# Patient Record
Sex: Female | Born: 1960 | Race: White | Hispanic: No | Marital: Married | Smoking: Never smoker
Health system: Southern US, Community
[De-identification: ages and names within clinical notes are randomized; demographics above are authoritative.]

## PROBLEM LIST (undated history)

## (undated) ENCOUNTER — Emergency Department (HOSPITAL_BASED_OUTPATIENT_CLINIC_OR_DEPARTMENT_OTHER): Admission: EM | Payer: BC Managed Care – PPO | Source: Home / Self Care

## (undated) DIAGNOSIS — F32A Depression, unspecified: Secondary | ICD-10-CM

## (undated) DIAGNOSIS — F419 Anxiety disorder, unspecified: Secondary | ICD-10-CM

## (undated) DIAGNOSIS — F319 Bipolar disorder, unspecified: Secondary | ICD-10-CM

## (undated) DIAGNOSIS — E039 Hypothyroidism, unspecified: Secondary | ICD-10-CM

## (undated) DIAGNOSIS — F329 Major depressive disorder, single episode, unspecified: Secondary | ICD-10-CM

## (undated) HISTORY — PX: FRACTURE SURGERY: SHX138

## (undated) HISTORY — PX: DIAGNOSTIC LAPAROSCOPY: SUR761

## (undated) HISTORY — PX: LAPAROSCOPIC GASTRIC SLEEVE RESECTION: SHX5895

## (undated) HISTORY — PX: OTHER SURGICAL HISTORY: SHX169

## (undated) HISTORY — PX: DILATION AND CURETTAGE OF UTERUS: SHX78

---

## 1999-05-18 ENCOUNTER — Encounter: Payer: Self-pay | Admitting: Family Medicine

## 1999-05-18 ENCOUNTER — Ambulatory Visit (HOSPITAL_COMMUNITY): Admission: RE | Admit: 1999-05-18 | Discharge: 1999-05-18 | Payer: Self-pay | Admitting: Family Medicine

## 2000-12-29 ENCOUNTER — Encounter: Payer: Self-pay | Admitting: Obstetrics and Gynecology

## 2000-12-29 ENCOUNTER — Ambulatory Visit (HOSPITAL_COMMUNITY): Admission: RE | Admit: 2000-12-29 | Discharge: 2000-12-29 | Payer: Self-pay | Admitting: Obstetrics and Gynecology

## 2001-01-11 ENCOUNTER — Other Ambulatory Visit: Admission: RE | Admit: 2001-01-11 | Discharge: 2001-01-11 | Payer: Self-pay | Admitting: Obstetrics and Gynecology

## 2002-01-17 ENCOUNTER — Other Ambulatory Visit: Admission: RE | Admit: 2002-01-17 | Discharge: 2002-01-17 | Payer: Self-pay | Admitting: Obstetrics and Gynecology

## 2002-01-26 ENCOUNTER — Encounter: Payer: Self-pay | Admitting: Obstetrics and Gynecology

## 2002-01-26 ENCOUNTER — Ambulatory Visit (HOSPITAL_COMMUNITY): Admission: RE | Admit: 2002-01-26 | Discharge: 2002-01-26 | Payer: Self-pay | Admitting: Obstetrics and Gynecology

## 2002-05-02 ENCOUNTER — Ambulatory Visit (HOSPITAL_COMMUNITY): Admission: RE | Admit: 2002-05-02 | Discharge: 2002-05-02 | Payer: Self-pay | Admitting: Obstetrics and Gynecology

## 2002-05-02 ENCOUNTER — Encounter (INDEPENDENT_AMBULATORY_CARE_PROVIDER_SITE_OTHER): Payer: Self-pay | Admitting: Specialist

## 2003-01-19 ENCOUNTER — Ambulatory Visit (HOSPITAL_BASED_OUTPATIENT_CLINIC_OR_DEPARTMENT_OTHER): Admission: RE | Admit: 2003-01-19 | Discharge: 2003-01-19 | Payer: Self-pay | Admitting: Orthopedic Surgery

## 2003-01-19 ENCOUNTER — Encounter (INDEPENDENT_AMBULATORY_CARE_PROVIDER_SITE_OTHER): Payer: Self-pay | Admitting: *Deleted

## 2003-03-22 ENCOUNTER — Other Ambulatory Visit: Admission: RE | Admit: 2003-03-22 | Discharge: 2003-03-22 | Payer: Self-pay | Admitting: Obstetrics and Gynecology

## 2003-03-31 ENCOUNTER — Encounter: Payer: Self-pay | Admitting: Obstetrics and Gynecology

## 2003-03-31 ENCOUNTER — Ambulatory Visit (HOSPITAL_COMMUNITY): Admission: RE | Admit: 2003-03-31 | Discharge: 2003-03-31 | Payer: Self-pay | Admitting: Obstetrics and Gynecology

## 2004-04-08 ENCOUNTER — Ambulatory Visit (HOSPITAL_COMMUNITY): Admission: RE | Admit: 2004-04-08 | Discharge: 2004-04-08 | Payer: Self-pay | Admitting: Obstetrics and Gynecology

## 2004-05-01 ENCOUNTER — Other Ambulatory Visit: Admission: RE | Admit: 2004-05-01 | Discharge: 2004-05-01 | Payer: Self-pay | Admitting: Obstetrics and Gynecology

## 2005-05-02 ENCOUNTER — Other Ambulatory Visit: Admission: RE | Admit: 2005-05-02 | Discharge: 2005-05-02 | Payer: Self-pay | Admitting: Obstetrics and Gynecology

## 2005-07-17 ENCOUNTER — Ambulatory Visit (HOSPITAL_COMMUNITY): Admission: RE | Admit: 2005-07-17 | Discharge: 2005-07-17 | Payer: Self-pay | Admitting: Obstetrics and Gynecology

## 2006-05-25 ENCOUNTER — Other Ambulatory Visit: Admission: RE | Admit: 2006-05-25 | Discharge: 2006-05-25 | Payer: Self-pay | Admitting: Obstetrics and Gynecology

## 2006-07-20 ENCOUNTER — Ambulatory Visit (HOSPITAL_COMMUNITY): Admission: RE | Admit: 2006-07-20 | Discharge: 2006-07-20 | Payer: Self-pay | Admitting: Obstetrics and Gynecology

## 2007-01-13 ENCOUNTER — Encounter: Admission: RE | Admit: 2007-01-13 | Discharge: 2007-01-13 | Payer: Self-pay | Admitting: Family Medicine

## 2007-08-13 ENCOUNTER — Ambulatory Visit (HOSPITAL_COMMUNITY): Admission: RE | Admit: 2007-08-13 | Discharge: 2007-08-13 | Payer: Self-pay | Admitting: Obstetrics and Gynecology

## 2008-09-14 ENCOUNTER — Ambulatory Visit (HOSPITAL_COMMUNITY): Admission: RE | Admit: 2008-09-14 | Discharge: 2008-09-14 | Payer: Self-pay | Admitting: Obstetrics and Gynecology

## 2008-09-28 ENCOUNTER — Encounter: Admission: RE | Admit: 2008-09-28 | Discharge: 2008-09-28 | Payer: Self-pay | Admitting: Internal Medicine

## 2008-11-06 ENCOUNTER — Encounter: Admission: RE | Admit: 2008-11-06 | Discharge: 2008-11-06 | Payer: Self-pay | Admitting: Podiatry

## 2010-01-21 ENCOUNTER — Ambulatory Visit (HOSPITAL_COMMUNITY): Admission: RE | Admit: 2010-01-21 | Discharge: 2010-01-21 | Payer: Self-pay | Admitting: Obstetrics and Gynecology

## 2010-07-22 ENCOUNTER — Encounter: Payer: Self-pay | Admitting: Podiatry

## 2010-11-15 NOTE — Op Note (Signed)
NAME:  Lindsay [name], YAGI A                           ACCOUNT NO.:  [id number]   MEDICAL RECORD NO.:  [id number]                   PATIENT TYPE:  AMB   LOCATION:  DSC                                  FACILITY:  [facility]   PHYSICIAN:  Katy Fitch. Naaman Plummer., M.D.          DATE OF BIRTH:  October 22, 1960   DATE OF PROCEDURE:  01/19/2003  DATE OF DISCHARGE:                                 OPERATIVE REPORT   PREOPERATIVE DIAGNOSIS:  Hair granuloma/foreign body granuloma left long  finger volar aspect of PIP joint.   POSTOPERATIVE DIAGNOSIS:  Hair granuloma/foreign body granuloma left long  finger volar aspect of PIP joint.   OPERATION:  Excision of foreign body granuloma and hair retained foreign  body from left long finger volar aspect.   SURGEON:  Katy Fitch. Sypher, M.D.   ASSISTANT:  Jonni Sanger, P.A.   ANESTHESIA:  MAC supplemented by IV sedation.   SUPERVISING ANESTHESIOLOGIST:  Kaylyn Layer. Michelle Piper, M.D.   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 She developed a chronic  wound that would not heal correctly. She had a mass consistent with a  granuloma/foreign body granuloma. She was referred by her primary care  physician, Meredith Staggers, M.D., for evaluation and management of this  predicament.   After informed consent she was brought to the operating room at this time  anticipating debridement of her wound.   DESCRIPTION OF PROCEDURE:  Zurii Hewes was brought to the operating room and  placed in the supine position on the operating table. Following light  sedation the left arm was prepped with Betadine and soap solution and  sterilely draped. Then 0.25% Marcaine and 2% Lidocaine were infiltrated at  the metacarpal head level to obtain a digital block. When anesthesia was  satisfactory the finger was exsanguinated with a gauze wrap and a 1/2-inch  Penrose drain placed over the proximal phalangeal  segment as a digital  tourniquet.   The procedure commenced with an elliptical excision of skin involving the  entire obvious granuloma. The subcutaneous tissues were carefully divided  taking care to identify the subcutaneous tissues deep to the hypertrophic  scar. Multiple fine, black hairs were identified deep to the dermis in the  subcutaneous tissues surrounding the neurovascular bundle. These were picked  individually with plain forceps and a Rongeur.   The subcutaneous granuloma was excised with a Rongeur followed by careful  irrigation of the wound. The wound was then repaired with vertical mattress  suture of 5-0 nylon. A compressive dressing was applied with Xeroform,  sterile gauze, and Coban.   For after care Ms. Pokorny was given a prescription for Darvocet-N 100 1-2  tablets p.o. q.4-6h. p.r.n. pain, 20 tablets without refill. Also she was  given a prescription for Keflex 500 mg 1 p.o. q.8h. x4 day as a prophylactic  antibiotic.                                               Katy Fitch Naaman Plummer., M.D.    RVS/MEDQ  D:  01/19/2003  T:  01/19/2003  Job:  [id number]

## 2010-11-15 NOTE — H&P (Signed)
NAME:  Lindsay Lindsay [name], Lindsay Lindsay [name]                           ACCOUNT NO.:  [id number]   MEDICAL RECORD NO.:  [id number]                   PATIENT TYPE:  AMB   LOCATION:  SDC                                  FACILITY:  [facility]   PHYSICIAN:  Naima Lindsay [name]. Dillard, M.D.              DATE OF BIRTH:  09-27-1960   DATE OF ADMISSION:  DATE OF DISCHARGE:                                HISTORY & PHYSICAL   CHIEF COMPLAINT:  Metrorrhagia with stress urinary incontinence with primary  cystocele.   HISTORY OF PRESENT ILLNESS:  The patient presented to the office on January 27, 2002 complaining of leakage of urine with coughing or sneezing.  She denied  having any urinary tract infection symptoms.  States that it has been going  on for several years.  Her urine culture was found to be negative.  She was  also found to have some mild hematuria.  The patient did not have any  abdominal pain.  She also stated that she was having some spotting before  menses.  Denied any abdominal pain, change in bowel or bladder habits.  The  patient denied having any contraception use, fibroids, or hormone therapy  use.  She denied having any menopausal symptoms or vaginal discharge or  increased stress.   PAST OB HISTORY:  Significant for Lindsay [name] full-term vaginal delivery x1 without  any complications and two cesarean sections without any complications.  Her  large infant was 8 pounds 16 ounces.   PAST SURGICAL HISTORY:  Cesarean section x2 and Lindsay [name] rectocele repair in [facility].   ALLERGIES:  No known drug allergies.   MEDICATIONS:  Wellbutrin, Paxil, calcium, multivitamins.   FAMILY HISTORY:  Unremarkable for any ovarian or breast cancer.  She does  have an aunt who has diabetes and denies any hypertensive disease in the  family.   SOCIAL HISTORY:  Negative x3.   PERTINENT LABORATORIES:  The patient had an endometrial biopsy which showed  secretory endometrium.  She had Lindsay [name] sonohysterogram which showed some  echogenic foci in the  endometrial canal with appearance of polyps.  The  largest was 1.3 cm.  The next largest was 9.0 mm.  The next one was 5.5 mm.  Her endometrial biopsy showed benign secretory endometrium.  Her mammogram  from July 2003 was found to be negative.  Her Pap smear on January 17, 2002 was  within normal limits.   REVIEW OF SYSTEMS:  HEENT:  Within normal limits.  ENDOCRINE:  Within normal  limits.  PSYCHIATRIC:  She does have Lindsay [name] history of depression.  CARDIORESPIRATORY:  Within normal limits.  GENITOURINARY:  As above.  MUSCULOSKELETAL:  Within normal limits.  NEUROLOGIC:  Within normal limits.   PHYSICAL EXAMINATION:  VITAL SIGNS:  The patient weighs 186 pounds.  Her  blood pressure is 110/60.  HEENT:  Pupils are equal.  Hearing is normal.  Throat is  clear.  NECK:  Thyroid was not enlarged.  HEART:  Regular rate and rhythm.  LUNGS:  Clear to auscultation bilaterally.  BREASTS:  No masses, discharge, skin changes, or nipple retraction  bilaterally.  BACK:  No CVA tenderness.  ABDOMEN:  Nontender without any masses or organomegaly.  EXTREMITIES:  No clubbing, cyanosis, edema.  NEUROLOGIC:  Within normal limits.  PELVIC:  Vulva/vaginal examination also within normal limits.  She had Lindsay [name]  first degree cystocele.  Cervix was nontender without lesions.  Uterus was  about 10 weeks' size and nontender.  Adnexa had no masses.   ASSESSMENT:  1. Metrorrhagia with endometrial polyps.  2. Primary cystocele.   PLAN:  Do Lindsay [name] D&C/hysteroscopy, polypectomy and Lindsay [name] sling SPARK procedure with  Dr. Brunilda Payor.  The patient has been evaluated by Dr. Brunilda Payor already.  She  understands that the risks are, but not limited to, further  D&C/hysteroscopy, perforation of the uterus, damage to internal organs such  as bowel, bladder, or ureters, infection, and bleeding.  She went over the  risks of the sling procedure with Dr. Brunilda Payor.                                               Naima Lindsay [name]. Normand Sloop, M.D.    NAD/MEDQ  D:   05/02/2002  T:  05/02/2002  Job:  [id number]

## 2011-01-06 ENCOUNTER — Other Ambulatory Visit (HOSPITAL_COMMUNITY): Payer: Self-pay | Admitting: Obstetrics and Gynecology

## 2011-01-06 DIAGNOSIS — Z1231 Encounter for screening mammogram for malignant neoplasm of breast: Secondary | ICD-10-CM

## 2011-01-23 ENCOUNTER — Ambulatory Visit (HOSPITAL_COMMUNITY)
Admission: RE | Admit: 2011-01-23 | Discharge: 2011-01-23 | Disposition: A | Discharge status: Home / Self Care | Payer: BC Managed Care – PPO | Source: Ambulatory Visit | Attending: Obstetrics and Gynecology | Admitting: Obstetrics and Gynecology

## 2011-01-23 DIAGNOSIS — Z1231 Encounter for screening mammogram for malignant neoplasm of breast: Secondary | ICD-10-CM | POA: Insufficient documentation

## 2012-03-16 ENCOUNTER — Other Ambulatory Visit (HOSPITAL_COMMUNITY): Payer: Self-pay | Admitting: Obstetrics and Gynecology

## 2012-03-16 DIAGNOSIS — Z1231 Encounter for screening mammogram for malignant neoplasm of breast: Secondary | ICD-10-CM

## 2012-03-30 ENCOUNTER — Ambulatory Visit (HOSPITAL_COMMUNITY)
Admission: RE | Admit: 2012-03-30 | Discharge: 2012-03-30 | Disposition: A | Discharge status: Home / Self Care | Payer: BC Managed Care – PPO | Source: Ambulatory Visit | Attending: Obstetrics and Gynecology | Admitting: Obstetrics and Gynecology

## 2012-03-30 DIAGNOSIS — Z1231 Encounter for screening mammogram for malignant neoplasm of breast: Secondary | ICD-10-CM | POA: Insufficient documentation

## 2013-03-24 ENCOUNTER — Other Ambulatory Visit (HOSPITAL_COMMUNITY): Payer: Self-pay | Admitting: Nurse Practitioner

## 2013-03-24 DIAGNOSIS — Z1231 Encounter for screening mammogram for malignant neoplasm of breast: Secondary | ICD-10-CM

## 2013-04-05 ENCOUNTER — Ambulatory Visit (HOSPITAL_COMMUNITY)
Admission: RE | Admit: 2013-04-05 | Discharge: 2013-04-05 | Disposition: A | Discharge status: Home / Self Care | Payer: BC Managed Care – PPO | Source: Ambulatory Visit | Attending: Nurse Practitioner | Admitting: Nurse Practitioner

## 2013-04-05 DIAGNOSIS — Z1231 Encounter for screening mammogram for malignant neoplasm of breast: Secondary | ICD-10-CM

## 2014-07-03 ENCOUNTER — Other Ambulatory Visit (HOSPITAL_COMMUNITY): Payer: Self-pay | Admitting: Nurse Practitioner

## 2014-07-03 DIAGNOSIS — Z1231 Encounter for screening mammogram for malignant neoplasm of breast: Secondary | ICD-10-CM

## 2014-07-04 ENCOUNTER — Ambulatory Visit (HOSPITAL_COMMUNITY): Payer: Self-pay

## 2015-01-22 ENCOUNTER — Ambulatory Visit (HOSPITAL_COMMUNITY)
Admission: RE | Admit: 2015-01-22 | Discharge: 2015-01-22 | Disposition: A | Discharge status: Home / Self Care | Payer: BLUE CROSS/BLUE SHIELD | Source: Ambulatory Visit | Attending: Nurse Practitioner | Admitting: Nurse Practitioner

## 2015-01-22 DIAGNOSIS — Z1231 Encounter for screening mammogram for malignant neoplasm of breast: Secondary | ICD-10-CM | POA: Insufficient documentation

## 2015-05-09 ENCOUNTER — Other Ambulatory Visit: Payer: Self-pay | Admitting: Obstetrics and Gynecology

## 2015-05-11 ENCOUNTER — Other Ambulatory Visit: Payer: Self-pay | Admitting: Family Medicine

## 2015-05-11 DIAGNOSIS — M79605 Pain in left leg: Secondary | ICD-10-CM

## 2015-05-11 DIAGNOSIS — Z9889 Other specified postprocedural states: Secondary | ICD-10-CM

## 2015-05-22 ENCOUNTER — Other Ambulatory Visit: Payer: Self-pay | Admitting: Obstetrics and Gynecology

## 2015-05-23 ENCOUNTER — Other Ambulatory Visit (HOSPITAL_COMMUNITY): Payer: Self-pay | Admitting: Obstetrics and Gynecology

## 2015-05-23 NOTE — H&P (Signed)
Lindsay [name] is a 54 y.o. female,  P: 3-0-0-3 presents for placement of tension free vaginal tape and hysteroscopy, dilatation and curettage because of stress urinary incontinence and post menopausal bleeding.  The patient went for 2 years without a menstrual period until 2 years ago when she began to have intermittent vaginal bleeding that is only noticeable with wiping.  She denies any cramping associated with these episodes and has not had an actual flow during this time. She goes on to deny any changes in bowel function, discomfort with intercourse or vaginitis symptoms.  Over the past year, however,  the patient reports leaking of urine with various activities, especially with brisk walking. She has no history of renal stones, dysuria, urinary urgency or frequency. The patient underwent Cystometrics in November 2016  that revealed stress urinary incontinence secondary to hypermobility and increased bladder capacity but no urinary retention.  A pelvic ultrasound done at the same time showed: uterus: 6.83 x 4.53 x 4.60 cm, endometrium: 0.34 cm (hyperechoic, irregular with normal morphology), a normal right ovary but left ovary was not visualized;  both adnexae were normal. An endometrial biopsy performed at pre-operative visit showed: minute fragments of endometrial glands and stroma and negative for hyperplasia or malingnancy.  A review of both medical and surgical management options were given to the patient regarding her bleeding and incontinence symptoms, however,  she has decided to proceed with placement of tension free vaginal tape and hysteroscopy, dilatation and curettage for management and further evaluation of her symptoms.   Past Medical History  OB History: G: 3;  P: 3-0-0-3;  C-section 1980 and 1983; SVB 1985 (8 lbs. 13 oz. infant)  GYN History: menarche: 89 YO    LMP: post menopausal (see HPI)    Contracepton vasectomy  The patient denies history of sexually transmitted disease.  Remote   history of abnormal PAP smear that was repeated and returned normal.   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 Tetracyclines & Related Other (See Comments)    Unknown childhood reaction    ROS: Admits to glasses for reading, occasional constipation and lower back pain but  denies headache, vision changes, nasal congestion, dysphagia, tinnitus, dizziness, hoarseness, cough,  chest pain, shortness of breath, nausea, vomiting, diarrhea,  urinary frequency, urgency  dysuria, hematuria, vaginitis symptoms, pelvic pain, swelling of joints,easy bruising,  myalgias, arthralgias, skin rashes, unexplained weight loss and except as is mentioned in the history of present illness, patient's review of systems is otherwise negative.   Physical Exam  Bp: 124/74  P: 64   R: 20  Temperature: 98.5 degrees F orally  Weight: 189 lbs.  Height: 5'7"  BMI: 29.1  Neck:  supple without masses or thyromegaly Lungs: clear to auscultation Heart: regular rate and rhythm Abdomen: soft, non-tender and no organomegaly Pelvic:EGBUS- wnl; vagina-normal rugae; uterus-normal size, cervix:  stenotic,  without lesions or motion tenderness; adnexae-no tenderness or masses Extremities:  no clubbing, cyanosis or edema   Assesment: Post Menopausal Bleeding           Stress Urinary Incontinence   Disposition: Reviewed the risks of surgery to include, but not limited to: reaction to anesthesia, damage to adjacent organs, infection, worsening of incontinence symptoms, excessive bleeding and erosion of vaginal tape. The patient verbalized understanding of these risks and has consented to proceed with Hysteroscopy, Dilatation, Curettage and Placement of Tension Free Vaginal Tape on 06/01/2015.  The patient was given Cytotec to use as directed prior to her procedure in order to facilitate dilatation of her cervix at the time of surgery.  CSN# [id number]   Vernon Ariel J. Lowell GuitarPowell, PA-C  for Dr. Woodroe ModeAngela Y. Su Hiltoberts

## 2015-05-25 ENCOUNTER — Other Ambulatory Visit (HOSPITAL_COMMUNITY): Payer: Self-pay | Admitting: Obstetrics and Gynecology

## 2015-05-28 ENCOUNTER — Encounter (HOSPITAL_COMMUNITY)
Admission: RE | Admit: 2015-05-28 | Discharge: 2015-05-28 | Disposition: A | Discharge status: Home / Self Care | Payer: BLUE CROSS/BLUE SHIELD | Source: Ambulatory Visit | Attending: Obstetrics and Gynecology | Admitting: Obstetrics and Gynecology

## 2015-05-28 ENCOUNTER — Encounter (HOSPITAL_COMMUNITY): Payer: Self-pay

## 2015-05-28 DIAGNOSIS — Z01818 Encounter for other preprocedural examination: Secondary | ICD-10-CM | POA: Insufficient documentation

## 2015-05-28 DIAGNOSIS — R32 Unspecified urinary incontinence: Secondary | ICD-10-CM | POA: Insufficient documentation

## 2015-05-28 DIAGNOSIS — N950 Postmenopausal bleeding: Secondary | ICD-10-CM | POA: Diagnosis not present

## 2015-05-28 HISTORY — DX: Depression, unspecified: F32.A

## 2015-05-28 HISTORY — DX: Bipolar disorder, unspecified: F31.9

## 2015-05-28 HISTORY — DX: Major depressive disorder, single episode, unspecified: F32.9

## 2015-05-28 HISTORY — DX: Anxiety disorder, unspecified: F41.9

## 2015-05-28 HISTORY — DX: Hypothyroidism, unspecified: E03.9

## 2015-05-28 LAB — CBC
HCT: 37.3 % (ref 36.0–46.0)
HCT: 37.5 % (ref 36.0–46.0)
Hemoglobin: 12.3 g/dL (ref 12.0–15.0)
Hemoglobin: 12.9 g/dL (ref 12.0–15.0)
MCH: 30.4 pg (ref 26.0–34.0)
MCH: 33 pg (ref 26.0–34.0)
MCHC: 33 g/dL (ref 30.0–36.0)
MCHC: 34.4 g/dL (ref 30.0–36.0)
MCV: 92.1 fL (ref 78.0–100.0)
MCV: 95.9 fL (ref 78.0–100.0)
PLATELETS: 233 10*3/uL (ref 150–400)
PLATELETS: 316 10*3/uL (ref 150–400)
RBC: 4.05 MIL/uL (ref 3.87–5.11)
RBC: 3.91 MIL/uL (ref 3.87–5.11)
RDW: 12.9 % (ref 11.5–15.5)
RDW: 13.6 % (ref 11.5–15.5)
WBC: 5.2 10*3/uL (ref 4.0–10.5)
WBC: 3.6 10*3/uL — ABNORMAL LOW (ref 4.0–10.5)

## 2015-05-28 NOTE — Patient Instructions (Signed)
Your procedure is scheduled on:  June 01, 2015    [facility] at: 10:00 am   Pick up the phone at the desk and dial [phone].  Call this number if you have problems the morning of surgery: [phone].  Remember: Do NOT eat food: after midnight on Thursday  Do NOT drink clear liquids after: midnight on Thursday  Take these medicines the morning of surgery with a SIP OF WATER: synthroid   Do NOT wear jewelry (body piercing), metal hair clips/bobby pins, make-up, or nail polish. Do NOT wear lotions, powders, or perfumes.  You may wear deoderant. Do NOT shave for 48 hours prior to surgery. Do NOT bring valuables to the hospital. Contacts, dentures, or bridgework may not be worn into surgery. Leave suitcase in car.  After surgery it may be brought to your room.  For patients admitted to the hospital, checkout time is 11:00 AM the day of discharge.

## 2015-06-01 ENCOUNTER — Observation Stay (HOSPITAL_COMMUNITY)
Admission: RE | Admit: 2015-06-01 | Discharge: 2015-06-02 | Disposition: A | Discharge status: Home / Self Care | Payer: BLUE CROSS/BLUE SHIELD | Source: Ambulatory Visit | Attending: Obstetrics and Gynecology | Admitting: Obstetrics and Gynecology

## 2015-06-01 ENCOUNTER — Encounter (HOSPITAL_COMMUNITY)
Admission: RE | Disposition: A | Discharge status: Home / Self Care | Payer: Self-pay | Source: Ambulatory Visit | Attending: Obstetrics and Gynecology

## 2015-06-01 ENCOUNTER — Ambulatory Visit (HOSPITAL_COMMUNITY): Payer: BLUE CROSS/BLUE SHIELD | Admitting: Anesthesiology

## 2015-06-01 ENCOUNTER — Encounter (HOSPITAL_COMMUNITY): Payer: Self-pay | Admitting: Emergency Medicine

## 2015-06-01 DIAGNOSIS — Z79899 Other long term (current) drug therapy: Secondary | ICD-10-CM | POA: Diagnosis not present

## 2015-06-01 DIAGNOSIS — Z23 Encounter for immunization: Secondary | ICD-10-CM | POA: Diagnosis not present

## 2015-06-01 DIAGNOSIS — N393 Stress incontinence (female) (male): Secondary | ICD-10-CM | POA: Diagnosis present

## 2015-06-01 DIAGNOSIS — N950 Postmenopausal bleeding: Principal | ICD-10-CM | POA: Insufficient documentation

## 2015-06-01 DIAGNOSIS — N840 Polyp of corpus uteri: Secondary | ICD-10-CM | POA: Diagnosis not present

## 2015-06-01 HISTORY — PX: CYSTOSCOPY: SHX5120

## 2015-06-01 HISTORY — PX: BLADDER SUSPENSION: SHX72

## 2015-06-01 HISTORY — PX: DILATATION & CURRETTAGE/HYSTEROSCOPY WITH RESECTOCOPE: SHX5572

## 2015-06-01 SURGERY — URETHROPEXY, USING TRANSVAGINAL TAPE
Anesthesia: General | Site: Vagina

## 2015-06-01 MED ORDER — MIDAZOLAM HCL 2 MG/2ML IJ SOLN
INTRAMUSCULAR | Status: DC | PRN
  Administered 2015-06-01: 2 mg via INTRAVENOUS

## 2015-06-01 MED ORDER — MENTHOL 3 MG MT LOZG: 1.0000 | LOZENGE | OROMUCOSAL | Status: DC | PRN

## 2015-06-01 MED ORDER — SCOPOLAMINE 1 MG/3DAYS TD PT72
MEDICATED_PATCH | TRANSDERMAL | Status: AC
  Administered 2015-06-01: 1.5 mg via TRANSDERMAL
  Filled 2015-06-01: qty 1

## 2015-06-01 MED ORDER — STERILE WATER FOR IRRIGATION IR SOLN
Status: DC | PRN
  Administered 2015-06-01: 1000 mL

## 2015-06-01 MED ORDER — MIDAZOLAM HCL 2 MG/2ML IJ SOLN
INTRAMUSCULAR | Status: AC
  Filled 2015-06-01: qty 2

## 2015-06-01 MED ORDER — DEXAMETHASONE SODIUM PHOSPHATE 10 MG/ML IJ SOLN
INTRAMUSCULAR | Status: DC | PRN
  Administered 2015-06-01: 4 mg via INTRAVENOUS

## 2015-06-01 MED ORDER — PROMETHAZINE HCL 25 MG/ML IJ SOLN: 6.2500 mg | INTRAMUSCULAR | Status: DC | PRN

## 2015-06-01 MED ORDER — LIDOCAINE HCL 1 % IJ SOLN
INTRAMUSCULAR | Status: AC
  Filled 2015-06-01: qty 20

## 2015-06-01 MED ORDER — EPHEDRINE 5 MG/ML INJ
INTRAVENOUS | Status: AC
  Filled 2015-06-01: qty 10

## 2015-06-01 MED ORDER — ZOLPIDEM TARTRATE 5 MG PO TABS
5.0000 mg | ORAL_TABLET | Freq: Every day | ORAL | Status: DC
  Administered 2015-06-01: 5 mg via ORAL
  Filled 2015-06-01: qty 1

## 2015-06-01 MED ORDER — SIMVASTATIN 20 MG PO TABS
20.0000 mg | ORAL_TABLET | Freq: Every day | ORAL | Status: DC
  Administered 2015-06-02: 20 mg via ORAL
  Filled 2015-06-01: qty 1

## 2015-06-01 MED ORDER — FENTANYL CITRATE (PF) 100 MCG/2ML IJ SOLN
INTRAMUSCULAR | Status: DC | PRN
  Administered 2015-06-01 (×3): 50 ug via INTRAVENOUS

## 2015-06-01 MED ORDER — FENTANYL CITRATE (PF) 250 MCG/5ML IJ SOLN
INTRAMUSCULAR | Status: AC
Start: 2015-06-01 — End: 2015-06-01
  Filled 2015-06-01: qty 5

## 2015-06-01 MED ORDER — OXYCODONE-ACETAMINOPHEN 5-325 MG PO TABS: 1.0000 | ORAL_TABLET | ORAL | Status: DC | PRN

## 2015-06-01 MED ORDER — PROPOFOL 10 MG/ML IV BOLUS
INTRAVENOUS | Status: AC
  Filled 2015-06-01: qty 20

## 2015-06-01 MED ORDER — PROPOFOL 10 MG/ML IV BOLUS
INTRAVENOUS | Status: DC | PRN
  Administered 2015-06-01: 180 mg via INTRAVENOUS
  Administered 2015-06-01: 20 mg via INTRAVENOUS

## 2015-06-01 MED ORDER — ONDANSETRON HCL 4 MG/2ML IJ SOLN
INTRAMUSCULAR | Status: AC
  Filled 2015-06-01: qty 2

## 2015-06-01 MED ORDER — GLYCOPYRROLATE 0.2 MG/ML IJ SOLN
INTRAMUSCULAR | Status: AC
  Filled 2015-06-01: qty 1

## 2015-06-01 MED ORDER — ZIPRASIDONE HCL 80 MG PO CAPS
80.0000 mg | ORAL_CAPSULE | Freq: Every day | ORAL | Status: DC
  Administered 2015-06-02: 80 mg via ORAL
  Filled 2015-06-01 (×2): qty 1

## 2015-06-01 MED ORDER — HYDROMORPHONE HCL 1 MG/ML IJ SOLN: 1.0000 mg | INTRAMUSCULAR | Status: DC | PRN

## 2015-06-01 MED ORDER — INFLUENZA VAC SPLIT QUAD 0.5 ML IM SUSY
0.5000 mL | PREFILLED_SYRINGE | INTRAMUSCULAR | Status: AC
  Administered 2015-06-02: 0.5 mL via INTRAMUSCULAR
  Filled 2015-06-01: qty 0.5

## 2015-06-01 MED ORDER — GLYCINE 1.5 % IR SOLN
Status: DC | PRN
  Administered 2015-06-01: 3000 mL

## 2015-06-01 MED ORDER — CEFAZOLIN SODIUM-DEXTROSE 2-3 GM-% IV SOLR
INTRAVENOUS | Status: AC
  Filled 2015-06-01: qty 50

## 2015-06-01 MED ORDER — LIDOCAINE HCL (CARDIAC) 20 MG/ML IV SOLN
INTRAVENOUS | Status: AC
  Filled 2015-06-01: qty 5

## 2015-06-01 MED ORDER — LIDOCAINE HCL (CARDIAC) 20 MG/ML IV SOLN
INTRAVENOUS | Status: DC | PRN
  Administered 2015-06-01: 70 mg via INTRAVENOUS
  Administered 2015-06-01: 30 mg via INTRAVENOUS

## 2015-06-01 MED ORDER — KETOROLAC TROMETHAMINE 30 MG/ML IJ SOLN
INTRAMUSCULAR | Status: AC
  Filled 2015-06-01: qty 1

## 2015-06-01 MED ORDER — DOCUSATE SODIUM 100 MG PO CAPS
100.0000 mg | ORAL_CAPSULE | Freq: Three times a day (TID) | ORAL | Status: DC
  Administered 2015-06-01: 100 mg via ORAL
  Filled 2015-06-01: qty 1

## 2015-06-01 MED ORDER — LACTATED RINGERS IV SOLN: INTRAVENOUS | Status: DC

## 2015-06-01 MED ORDER — KETOROLAC TROMETHAMINE 30 MG/ML IJ SOLN
30.0000 mg | Freq: Four times a day (QID) | INTRAMUSCULAR | Status: AC
  Administered 2015-06-01 – 2015-06-02 (×2): 30 mg via INTRAVENOUS
  Filled 2015-06-01 (×2): qty 1

## 2015-06-01 MED ORDER — LACTATED RINGERS IV SOLN
INTRAVENOUS | Status: DC
  Administered 2015-06-01 (×2): via INTRAVENOUS

## 2015-06-01 MED ORDER — PROGESTERONE MICRONIZED 200 MG PO CAPS
200.0000 mg | ORAL_CAPSULE | Freq: Every day | ORAL | Status: DC
  Administered 2015-06-01: 200 mg via ORAL
  Filled 2015-06-01: qty 1

## 2015-06-01 MED ORDER — MEPERIDINE HCL 25 MG/ML IJ SOLN: 6.2500 mg | INTRAMUSCULAR | Status: DC | PRN

## 2015-06-01 MED ORDER — LACTATED RINGERS IV SOLN
INTRAVENOUS | Status: DC
  Administered 2015-06-01 – 2015-06-02 (×2): via INTRAVENOUS

## 2015-06-01 MED ORDER — DEXAMETHASONE SODIUM PHOSPHATE 4 MG/ML IJ SOLN
INTRAMUSCULAR | Status: AC
  Filled 2015-06-01: qty 1

## 2015-06-01 MED ORDER — VASOPRESSIN 20 UNIT/ML IV SOLN
INTRAVENOUS | Status: DC | PRN
  Administered 2015-06-01: 17 mL via INTRAMUSCULAR

## 2015-06-01 MED ORDER — SCOPOLAMINE 1 MG/3DAYS TD PT72
1.0000 | MEDICATED_PATCH | Freq: Once | TRANSDERMAL | Status: DC
  Administered 2015-06-01: 1.5 mg via TRANSDERMAL

## 2015-06-01 MED ORDER — ONDANSETRON HCL 4 MG/2ML IJ SOLN
INTRAMUSCULAR | Status: DC | PRN
  Administered 2015-06-01: 4 mg via INTRAVENOUS

## 2015-06-01 MED ORDER — IBUPROFEN 600 MG PO TABS: 600.0000 mg | ORAL_TABLET | Freq: Four times a day (QID) | ORAL | Status: DC | PRN

## 2015-06-01 MED ORDER — EPHEDRINE SULFATE 50 MG/ML IJ SOLN
INTRAMUSCULAR | Status: DC | PRN
  Administered 2015-06-01: 10 mg via INTRAVENOUS
  Administered 2015-06-01: 15 mg via INTRAVENOUS

## 2015-06-01 MED ORDER — SODIUM CHLORIDE 0.9 % IJ SOLN
INTRAMUSCULAR | Status: AC
  Filled 2015-06-01: qty 100

## 2015-06-01 MED ORDER — PAROXETINE HCL 30 MG PO TABS
45.0000 mg | ORAL_TABLET | Freq: Every day | ORAL | Status: DC
  Administered 2015-06-02: 45 mg via ORAL
  Filled 2015-06-01: qty 1.5

## 2015-06-01 MED ORDER — CEFAZOLIN SODIUM-DEXTROSE 2-3 GM-% IV SOLR
2.0000 g | INTRAVENOUS | Status: AC
  Administered 2015-06-01: 2 g via INTRAVENOUS

## 2015-06-01 MED ORDER — ZOLPIDEM TARTRATE 5 MG PO TABS: 10.0000 mg | ORAL_TABLET | Freq: Every day | ORAL | Status: DC

## 2015-06-01 MED ORDER — ESTRADIOL 0.1 MG/GM VA CREA
TOPICAL_CREAM | VAGINAL | Status: AC
  Filled 2015-06-01: qty 42.5

## 2015-06-01 MED ORDER — ONDANSETRON HCL 4 MG PO TABS
4.0000 mg | ORAL_TABLET | Freq: Three times a day (TID) | ORAL | Status: DC | PRN
  Administered 2015-06-01 – 2015-06-02 (×2): 4 mg via ORAL
  Filled 2015-06-01 (×2): qty 1

## 2015-06-01 MED ORDER — GLYCOPYRROLATE 0.2 MG/ML IJ SOLN
INTRAMUSCULAR | Status: DC | PRN
  Administered 2015-06-01: 0.2 mg via INTRAVENOUS

## 2015-06-01 MED ORDER — LEVOTHYROXINE SODIUM 150 MCG PO TABS
150.0000 ug | ORAL_TABLET | Freq: Every day | ORAL | Status: DC
  Administered 2015-06-02: 150 ug via ORAL
  Filled 2015-06-01: qty 1

## 2015-06-01 MED ORDER — HYDROMORPHONE HCL 1 MG/ML IJ SOLN
INTRAMUSCULAR | Status: AC
Start: 2015-06-01 — End: 2015-06-02
  Filled 2015-06-01: qty 1

## 2015-06-01 MED ORDER — LAMOTRIGINE 200 MG PO TABS
200.0000 mg | ORAL_TABLET | Freq: Every day | ORAL | Status: DC
  Administered 2015-06-02: 200 mg via ORAL
  Filled 2015-06-01: qty 1

## 2015-06-01 MED ORDER — KETOROLAC TROMETHAMINE 30 MG/ML IJ SOLN
INTRAMUSCULAR | Status: DC | PRN
  Administered 2015-06-01: 30 mg via INTRAVENOUS

## 2015-06-01 MED ORDER — HYDROMORPHONE HCL 1 MG/ML IJ SOLN
0.2500 mg | INTRAMUSCULAR | Status: DC | PRN
  Administered 2015-06-01 (×2): 0.5 mg via INTRAVENOUS

## 2015-06-01 MED ORDER — ESTRADIOL 0.1 MG/GM VA CREA
TOPICAL_CREAM | VAGINAL | Status: DC | PRN
  Administered 2015-06-01: 1 via VAGINAL

## 2015-06-01 SURGICAL SUPPLY — 45 items
BLADE SURG 11 STRL SS (BLADE) ×3 IMPLANT
BLADE SURG 15 STRL LF C SS BP (BLADE) ×2 IMPLANT
BLADE SURG 15 STRL SS (BLADE) ×3
CANISTER SUCT 3000ML (MISCELLANEOUS) ×3 IMPLANT
CATH FOLEY 2WAY SLVR  5CC 18FR (CATHETERS) ×1
CATH FOLEY 2WAY SLVR 5CC 18FR (CATHETERS) ×2 IMPLANT
CATH ROBINSON RED A/P 16FR (CATHETERS) ×3 IMPLANT
CLOTH BEACON ORANGE TIMEOUT ST (SAFETY) ×3 IMPLANT
CONTAINER PREFILL 10% NBF 60ML (FORM) ×6 IMPLANT
COVER MAYO STAND STRL (DRAPES) ×1 IMPLANT
DECANTER SPIKE VIAL GLASS SM (MISCELLANEOUS) ×2 IMPLANT
DRSG COVADERM PLUS 2X2 (GAUZE/BANDAGES/DRESSINGS) IMPLANT
ELECT REM PT RETURN 9FT ADLT (ELECTROSURGICAL) ×3
ELECTRODE REM PT RTRN 9FT ADLT (ELECTROSURGICAL) ×2 IMPLANT
GAUZE PACKING 2X5 YD STRL (GAUZE/BANDAGES/DRESSINGS) IMPLANT
GAUZE SPONGE 4X4 16PLY XRAY LF (GAUZE/BANDAGES/DRESSINGS) IMPLANT
GLOVE BIO SURGEON STRL SZ7.5 (GLOVE) ×3 IMPLANT
GLOVE BIOGEL PI IND STRL 7.0 (GLOVE) ×2 IMPLANT
GLOVE BIOGEL PI IND STRL 7.5 (GLOVE) ×2 IMPLANT
GLOVE BIOGEL PI INDICATOR 7.0 (GLOVE) ×1
GLOVE BIOGEL PI INDICATOR 7.5 (GLOVE) ×1
GOWN STRL REUS W/TWL LRG LVL3 (GOWN DISPOSABLE) ×6 IMPLANT
LIQUID BAND (GAUZE/BANDAGES/DRESSINGS) ×3 IMPLANT
LOOP ANGLED CUTTING 22FR (CUTTING LOOP) IMPLANT
NDL SPNL 22GX3.5 QUINCKE BK (NEEDLE) IMPLANT
NEEDLE HYPO 22GX1.5 SAFETY (NEEDLE) ×3 IMPLANT
NEEDLE SPNL 22GX3.5 QUINCKE BK (NEEDLE) IMPLANT
NS IRRIG 1000ML POUR BTL (IV SOLUTION) ×3 IMPLANT
PACK VAGINAL MINOR WOMEN LF (CUSTOM PROCEDURE TRAY) ×3 IMPLANT
PACK VAGINAL WOMENS (CUSTOM PROCEDURE TRAY) ×3 IMPLANT
PAD OB MATERNITY 4.3X12.25 (PERSONAL CARE ITEMS) ×3 IMPLANT
SET CYSTO W/LG BORE CLAMP LF (SET/KITS/TRAYS/PACK) ×3 IMPLANT
SLING TRANS VAGINAL TAPE (Sling) ×1 IMPLANT
SLING UTERINE/ABD GYNECARE TVT (Sling) ×4 IMPLANT
SUT MNCRL AB 3-0 PS2 27 (SUTURE) IMPLANT
SUT MNCRL AB 4-0 PS2 18 (SUTURE) ×2 IMPLANT
SUT VIC AB 0 CT1 27 (SUTURE) ×3
SUT VIC AB 0 CT1 27XBRD ANBCTR (SUTURE) ×2 IMPLANT
SUT VIC AB 2-0 SH 27 (SUTURE) ×24
SUT VIC AB 2-0 SH 27XBRD (SUTURE) ×16 IMPLANT
TOWEL OR 17X24 6PK STRL BLUE (TOWEL DISPOSABLE) ×6 IMPLANT
TRAY FOLEY CATH SILVER 14FR (SET/KITS/TRAYS/PACK) ×3 IMPLANT
TUBING AQUILEX INFLOW (TUBING) ×3 IMPLANT
TUBING AQUILEX OUTFLOW (TUBING) ×3 IMPLANT
WATER STERILE IRR 1000ML POUR (IV SOLUTION) ×3 IMPLANT

## 2015-06-01 NOTE — Anesthesia Preprocedure Evaluation (Addendum)
Anesthesia Evaluation  Patient identified by MRN, date of birth, ID band Patient awake    Reviewed: Allergy & Precautions, NPO status , Patient's Chart, lab work & pertinent test results  Airway Mallampati: II  TM Distance: >3 FB Neck ROM: Full    Dental  (+) Teeth Intact   Pulmonary neg pulmonary ROS,    breath sounds clear to auscultation       Cardiovascular negative cardio ROS   Rhythm:Regular Rate:Normal     Neuro/Psych PSYCHIATRIC DISORDERS Anxiety Depression Bipolar Disorder negative neurological ROS     GI/Hepatic negative GI ROS, Neg liver ROS,   Endo/Other  Hypothyroidism   Renal/GU negative Renal ROS  negative genitourinary   Musculoskeletal negative musculoskeletal ROS (+)   Abdominal   Peds negative pediatric ROS (+)  Hematology negative hematology ROS (+)   Anesthesia Other Findings   Reproductive/Obstetrics negative OB ROS                            Lab Results  Component Value Date   WBC 3.6* 05/28/2015   HGB 12.9 05/28/2015   HCT 37.5 05/28/2015   MCV 95.9 05/28/2015   PLT 316 05/28/2015   No results found for: CREATININE, BUN, NA, K, CL, CO2 No results found for: INR, PROTIME   Anesthesia Physical Anesthesia Plan  ASA: II  Anesthesia Plan: General   Post-op Pain Management:    Induction: Intravenous  Airway Management Planned: LMA  Additional Equipment:   Intra-op Plan:   Post-operative Plan: Extubation in OR  Informed Consent: I have reviewed the patients History and Physical, chart, labs and discussed the procedure including the risks, benefits and alternatives for the proposed anesthesia with the patient or authorized representative who has indicated his/her understanding and acceptance.   Dental advisory given  Plan Discussed with: CRNA  Anesthesia Plan Comments:         Anesthesia Quick Evaluation

## 2015-06-01 NOTE — Addendum Note (Signed)
Addendum  created 06/01/15 1758 by Graciela HusbandsWynn O Gervis Gaba, CRNA   Modules edited: Notes Section   Notes Section:  File: [id number]

## 2015-06-01 NOTE — Anesthesia Procedure Notes (Signed)
Procedure Name: LMA Insertion Date/Time: 06/01/2015 10:50 AM Performed by: Suella GroveMOORE, Terin Dierolf C Pre-anesthesia Checklist: Emergency Drugs available, Patient identified, Timeout performed, Suction available and Patient being monitored Patient Re-evaluated:Patient Re-evaluated prior to inductionOxygen Delivery Method: Circle system utilized and Simple face mask Preoxygenation: Pre-oxygenation with 100% oxygen Intubation Type: IV induction Ventilation: Mask ventilation without difficulty LMA: LMA inserted and LMA with gastric port inserted LMA Size: 4.0 Grade View: Grade II Number of attempts: 1 Placement Confirmation: positive ETCO2,  ETT inserted through vocal cords under direct vision and breath sounds checked- equal and bilateral Tube secured with: Tape Dental Injury: Teeth and Oropharynx as per pre-operative assessment

## 2015-06-01 NOTE — Op Note (Addendum)
Preop Diagnosis: Urinary Incontinence, Post Menopausal Bleeding   Postop Diagnosis: Urinary Incontinence, Post Menopausal Bleeding   Procedure: 1.TRANSVAGINAL TAPE (TVT) PROCEDURE/TENSION FREE VAGINAL TAPE 2.CYSTOSCOPY 3.DILATION & CURETTAGE/HYSTEROSCOPY  Anesthesia: General    Attending: Osborn CohoAngela Treysen Sudbeck, MD   Assistant: Henreitta LeberElmira Powell, PA-C  Findings: 2 small polyp on rt lateral and posterior wall of uterus  Pathology: endometrial curettings  Fluids: 900 cc  UOP: 20 cc  EBL: 50 cc  Complications: None  Procedure:The patient was taken to the operating room after the risks, benefits and alternatives were discussed with the patient. The patient verbalized understanding and consent signed and witnessed. The patient was placed under general anesthesia with an LMA per anesthesiologist and prepped and draped in the normal sterile fashion.  Time Out was performed per protocol.  A bivalve speculum was placed in the patient's vagina and the anterior lip of the cervix was grasped with a single tooth tenaculum.  The uterus sounded to 7 cm. The cervix was dilated for passage of the hysteroscope.  The hysteroscope was introduced into the uterine cavity and findings as noted above. Sharp curettage was performed until a gritty texture was noted and curettings were sent to pathology. The hysteroscope was reintroduced and no obvious remaining intracavitary lesions were noted.    A weighted speculum was placed in the patient's vagina and the anterior vaginal wall was injected with dilute pitressin at a concentration of 20 units of pitressin in a total of 100cc of normal saline.  An incision was made in the anterior wall of the vagina for approximately 1cm beneath the midurethra and the underlying tissue was dissected away from the anterior vaginal wall down to the level of the lower symphysis pubis bilaterally. Attention was then turned to the mons pubis where two 5 mm incisions were made 2  fingerbreadths from the midline. The transabdominal guide was then passed through the mons pubis incision on the patient's right down through the space of Retzius and out through the anterior vaginal wall after deflecting the rigid urethral catheter guide to the ipsilateral side. The same was done on the contralateral side. Cystoscopy was performed and no invadvertant bladder injury was noted. The bladder was drained with a Foley while deflecting the rigid urethral catheter guide to the patient's right and the mesh was attached to the transabdominal guide and elevated up through the space of Retzius and out through the incision on the mons pubis on the ipsilateral side. The same was done on the contralateral side. Cystoscopy was performed again and no inadvertant bladder injury was noted. The 4618 French Foley was left in the urethra and a large Tresa EndoKelly was placed between the urethra and the mesh in order to leave the mesh slack beneath the midurethra. The mesh was then cut flush with the skin at the mons pubis incisions bilaterally.  Cystoscopy was performed again and bilateral ureters were noted to efflux without difficulty. The bilateral incisions on the mons pubis were then cleaned and Liquabond applied. The anterior vaginal wall incision was repaired with 2-0 vicryl with interrupted stitches.  Vagina was packed with estrogen soaked vaginal packing.    All instruments were removed. Sponge lap and needle count was correct. The patient tolerated the procedure well and was returned to the recovery room in good condition.

## 2015-06-01 NOTE — H&P (View-Only) (Signed)
Lindsay [name] is a 54 y.o. female,  P: 3-0-0-3 presents for placement of tension free vaginal tape and hysteroscopy, dilatation and curettage because of stress urinary incontinence and post menopausal bleeding.  The patient went for 2 years without a menstrual period until 2 years ago when she began to have intermittent vaginal bleeding that is only noticeable with wiping.  She denies any cramping associated with these episodes and has not had an actual flow during this time. She goes on to deny any changes in bowel function, discomfort with intercourse or vaginitis symptoms.  Over the past year, however,  the patient reports leaking of urine with various activities, especially with brisk walking. She has no history of renal stones, dysuria, urinary urgency or frequency. The patient underwent Cystometrics in November 2016  that revealed stress urinary incontinence secondary to hypermobility and increased bladder capacity but no urinary retention.  A pelvic ultrasound done at the same time showed: uterus: 6.83 x 4.53 x 4.60 cm, endometrium: 0.34 cm (hyperechoic, irregular with normal morphology), a normal right ovary but left ovary was not visualized;  both adnexae were normal. An endometrial biopsy performed at pre-operative visit showed: minute fragments of endometrial glands and stroma and negative for hyperplasia or malingnancy.  A review of both medical and surgical management options were given to the patient regarding her bleeding and incontinence symptoms, however,  she has decided to proceed with placement of tension free vaginal tape and hysteroscopy, dilatation and curettage for management and further evaluation of her symptoms.   Past Medical History  OB History: G: 3;  P: 3-0-0-3;  C-section 1980 and 1983; SVB 1985 (8 lbs. 13 oz. infant)  GYN History: menarche: 15 YO    LMP: post menopausal (see HPI)    Contracepton vasectomy  The patient denies history of sexually transmitted disease.  Remote   history of abnormal PAP smear that was repeated and returned normal.   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 Tetracyclines & Related Other (See Comments)    Unknown childhood reaction    ROS: Admits to glasses for reading, occasional constipation and lower back pain but  denies headache, vision changes, nasal congestion, dysphagia, tinnitus, dizziness, hoarseness, cough,  chest pain, shortness of breath, nausea, vomiting, diarrhea,  urinary frequency, urgency  dysuria, hematuria, vaginitis symptoms, pelvic pain, swelling of joints,easy bruising,  myalgias, arthralgias, skin rashes, unexplained weight loss and except as is mentioned in the history of present illness, patient's review of systems is otherwise negative.   Physical Exam  Bp: 124/74  P: 64   R: 20  Temperature: 98.5 degrees F orally  Weight: 189 lbs.  Height: 5'7"  BMI: 29.1  Neck:   supple without masses or thyromegaly Lungs: clear to auscultation Heart: regular rate and rhythm Abdomen: soft, non-tender and no organomegaly Pelvic:EGBUS- wnl; vagina-normal rugae; uterus-normal size, cervix:  stenotic,  without lesions or motion tenderness; adnexae-no tenderness or masses Extremities:  no clubbing, cyanosis or edema   Assesment: Post Menopausal Bleeding           Stress Urinary Incontinence   Disposition: Reviewed the risks of surgery to include, but not limited to: reaction to anesthesia, damage to adjacent organs, infection, worsening of incontinence symptoms, excessive bleeding and erosion of vaginal tape. The patient verbalized understanding of these risks and has consented to proceed with Hysteroscopy, Dilatation, Curettage and Placement of Tension Free Vaginal Tape on 06/01/2015.  The patient was given Cytotec to use as directed prior to her procedure in order to facilitate dilatation of her cervix at the time of surgery.  CSN# [id number]   Imogean Ciampa J. Lowell GuitarPowell, PA-C  for Dr. Woodroe ModeAngela Y. Su Hiltoberts

## 2015-06-01 NOTE — Transfer of Care (Signed)
Immediate Anesthesia Transfer of Care Note  Patient: Lindsay MccallumNancy A Loos  Procedure(s) Performed: Procedure(s): TRANSVAGINAL TAPE (TVT) PROCEDURE (N/A) DILATATION & CURETTAGE/HYSTEROSCOPY WITH RESECTOCOPE (N/A)  Patient Location: PACU  Anesthesia Type:General  Level of Consciousness: awake, alert , oriented and patient cooperative  Airway & Oxygen Therapy: Patient Spontanous Breathing and Patient connected to nasal cannula oxygen  Post-op Assessment: Report given to RN and Post -op Vital signs reviewed and stable  Post vital signs: Reviewed and stable  Last Vitals:  Filed Vitals:   06/01/15 1008  BP: 104/47  Pulse: 58  Temp: 36.9 C  Resp: 16    Complications: No apparent anesthesia complications

## 2015-06-01 NOTE — Interval H&P Note (Signed)
History and Physical Interval Note:  06/01/2015 10:04 AM  Lindsay MccallumNancy A Agostinelli  has presented today for surgery, with the diagnosis of Urinary Incontinence, Post Menopausal Bleeding  The various methods of treatment have been discussed with the patient and family. After consideration of risks, benefits and other options for treatment, the patient has consented to  Procedure(s): TRANSVAGINAL TAPE (TVT) PROCEDURE/TENSION FREE VAGINAL TAPE (N/A) DILATATION & CURETTAGE/HYSTEROSCOPY WITH RESECTOCOPE (N/A) as a surgical intervention .  The patient's history has been reviewed, patient examined, no change in status, stable for surgery.  I have reviewed the patient's chart and labs.  Questions were answered to the patient's satisfaction.     Purcell NailsOBERTS,Aiko Belko Y

## 2015-06-01 NOTE — Anesthesia Postprocedure Evaluation (Signed)
Anesthesia Post Note  Patient: Satira MccallumNancy A Winegarden  Procedure(s) Performed: Procedure(s) (LRB): TRANSVAGINAL TAPE (TVT) PROCEDURE (N/A) DILATATION & CURETTAGE/HYSTEROSCOPY WITH RESECTOCOPE (N/A) CYSTOSCOPY (N/A)  Patient location during evaluation: [facility] Anesthesia Type: General Level of consciousness: awake and alert Pain management: satisfactory to patient Vital Signs Assessment: post-procedure vital signs reviewed and stable Respiratory status: spontaneous breathing and respiratory function stable Cardiovascular status: stable Postop Assessment: adequate PO intake and no signs of nausea or vomiting Anesthetic complications: no    Last Vitals:  Filed Vitals:   06/01/15 1530 06/01/15 1630  BP: 102/43 115/65  Pulse: 58 63  Temp: 37.2 C 36.8 C  Resp: 12 16    Last Pain:  Filed Vitals:   06/01/15 1724  PainSc: 0-No pain                 Samina Weekes

## 2015-06-01 NOTE — Transfer of Care (Signed)
Immediate Anesthesia Transfer of Care Note  Patient: Lindsay [name]  Procedure(s) Performed: Procedure(s): TRANSVAGINAL TAPE (TVT) PROCEDURE (N/A) DILATATION & CURETTAGE/HYSTEROSCOPY WITH RESECTOCOPE (N/A) CYSTOSCOPY (N/A)  Patient Location: PACU  Anesthesia TypeGen  Level of Consciousness: awake, alert , oriented and patient cooperative  Airway & Oxygen Therapy: Patient Spontanous Breathing and Patient connected to nasal cannula oxygen  Post-op Assessment: Report given to RN and Post -op Vital signs reviewed and stable  Post vital signs: Reviewed and stable  Last Vitals:  Filed Vitals:   06/01/15 1008  BP: 104/47  Pulse: 58  Temp: 36.9 C  Resp: 16    Complications: No apparent anesthesia complications

## 2015-06-01 NOTE — Transfer of Care (Signed)
Immediate Anesthesia Transfer of Care Note  Patient: Lindsay MccallumNancy A Kathan  Procedure(s) Performed: Procedure(s): TRANSVAGINAL TAPE (TVT) PROCEDURE (N/A) DILATATION & CURETTAGE/HYSTEROSCOPY WITH RESECTOCOPE (N/A) CYSTOSCOPY (N/A)  Patient Location: PACU  Anesthesia Type:General  Level of Consciousness: awake, alert , oriented and patient cooperative  Airway & Oxygen Therapy: Patient Spontanous Breathing and Patient connected to nasal cannula oxygen  Post-op Assessment: Report given to RN and Post -op Vital signs reviewed and stable  Post vital signs: Reviewed and stable  Last Vitals:  Filed Vitals:   06/01/15 1008  BP: 104/47  Pulse: 58  Temp: 36.9 C  Resp: 16    Complications: No apparent anesthesia complications

## 2015-06-01 NOTE — Anesthesia Postprocedure Evaluation (Signed)
Anesthesia Post Note  Patient: Satira MccallumNancy A Brillhart  Procedure(s) Performed: Procedure(s) (LRB): TRANSVAGINAL TAPE (TVT) PROCEDURE (N/A) DILATATION & CURETTAGE/HYSTEROSCOPY WITH RESECTOCOPE (N/A) CYSTOSCOPY (N/A)  Patient location during evaluation: PACU Anesthesia Type: General Level of consciousness: awake and alert and oriented Pain management: pain level controlled Vital Signs Assessment: post-procedure vital signs reviewed and stable Respiratory status: spontaneous breathing, nonlabored ventilation and respiratory function stable Cardiovascular status: blood pressure returned to baseline and stable Postop Assessment: no signs of nausea or vomiting Anesthetic complications: no    Last Vitals:  Filed Vitals:   06/01/15 1245 06/01/15 1300  BP: 111/47 97/39  Pulse: 74 63  Temp:    Resp: 16 21    Last Pain:  Filed Vitals:   06/01/15 1319  PainSc: Asleep                 Dessiree Sze A.

## 2015-06-02 DIAGNOSIS — N950 Postmenopausal bleeding: Secondary | ICD-10-CM | POA: Diagnosis not present

## 2015-06-02 LAB — CBC
HEMATOCRIT: 30.7 % — AB (ref 36.0–46.0)
HEMOGLOBIN: 10.3 g/dL — AB (ref 12.0–15.0)
MCH: 31.1 pg (ref 26.0–34.0)
MCHC: 33.6 g/dL (ref 30.0–36.0)
MCV: 92.7 fL (ref 78.0–100.0)
Platelets: 190 10*3/uL (ref 150–400)
RBC: 3.31 MIL/uL — ABNORMAL LOW (ref 3.87–5.11)
RDW: 13.1 % (ref 11.5–15.5)
WBC: 8.3 10*3/uL (ref 4.0–10.5)

## 2015-06-02 MED ORDER — IBUPROFEN 600 MG PO TABS: 600.0000 mg | ORAL_TABLET | Freq: Four times a day (QID) | ORAL | Status: DC | PRN

## 2015-06-02 MED ORDER — CIPROFLOXACIN HCL 250 MG PO TABS: 250.0000 mg | ORAL_TABLET | Freq: Two times a day (BID) | ORAL | Status: DC

## 2015-06-02 MED ORDER — OXYCODONE-ACETAMINOPHEN 5-325 MG PO TABS: 1.0000 | ORAL_TABLET | Freq: Four times a day (QID) | ORAL | Status: DC | PRN

## 2015-06-02 NOTE — Progress Notes (Signed)
Discharge instructions reviewed with patient.  Patient states understanding of home care, medications, activity, signs/symptoms to report to MD and return MD office visit.  Patients significant other and family will assist with her care @ home.  No home  equipment needed, patient has prescriptions and all personal belongings.  Patient discharged per wheelchair in stable condition with staff without incident.  

## 2015-06-02 NOTE — Progress Notes (Signed)
Vaginal packing removed as ordered, small amount dark drainage noted on packing after removal and foley cath discontinued as ordered. Patient tolerated well.

## 2015-06-02 NOTE — Discharge Summary (Addendum)
Physician Discharge Summary  Patient ID: Lindsay [name] A Gerstenberger MRN: [id number] DOB/AGE: 07-25-1960 54 y.o.  Admit date: 06/01/2015 Discharge date: 06/02/2015  Admission Diagnoses: Stress Incontinence Postmenopausal Bleeding  Discharge Diagnoses:  Active Problems:   Stress incontinence in female   Postmenopausal Bleeding  Discharged Condition: good  Hospital Course: Patient was admitted yesterday for hysteroscopy/D&C/TVT and Cystoscopy.  Surgery went well and patient is voiding without diffculty on post op day 1.  She has minimal bleeding.  2 small polyps were removed at the time of hysteroscopy.  Patient is tolerating regular diet and ambulating well.  Will discharge home with discharge instructions discussed and patient will f/u in office in about 6wks.  Consults: None  Significant Diagnostic Studies: labs: hgb 10.3 postop 12.9 preop  Treatments: as above  Discharge Exam: Blood pressure 107/62, pulse 49, temperature 98.3 F (36.8 C), temperature source Oral, resp. rate 18, height 5\' 6"  (1.676 m), weight 186 lb (84.369 kg), SpO2 97 %. General appearance: alert and no distress Resp: clear to auscultation bilaterally Cardio: regular rate and rhythm Extremities: extremities normal, atraumatic, no cyanosis or edema and Homans sign is negative, no sign of DVT Incision/Wound:liquabond in place  Disposition: Discharge to home in good condition     Medication List    TAKE these medications        aspirin 81 MG tablet  Take 81 mg by mouth daily.     ciprofloxacin 250 MG tablet  Commonly known as:  CIPRO  Take 1 tablet (250 mg total) by mouth every 12 (twelve) hours. For 3 days     ELESTRIN 0.52 MG/0.87 GM (0.06%) Gel  Generic drug:  Estradiol  Apply 1-1.5 application topically daily. Apply 1 application every other night, and on opposite nights apply 1.5 applications.     ibuprofen 600 MG tablet  Commonly known as:  ADVIL,MOTRIN  Take 1 tablet (600 mg total) by mouth every 6  (six) hours as needed (mild pain).     lamoTRIgine 200 MG tablet  Commonly known as:  LAMICTAL  Take 200 mg by mouth daily.     levothyroxine 150 MCG tablet  Commonly known as:  SYNTHROID, LEVOTHROID  Take 150 mcg by mouth daily before breakfast.     Magnesium Oxide 250 MG Tabs  Take 750 tablets by mouth daily.     oxyCODONE-acetaminophen 5-325 MG tablet  Commonly known as:  PERCOCET/ROXICET  Take 1 tablet by mouth every 6 (six) hours as needed for moderate pain or severe pain (moderate to severe pain (when tolerating fluids)).     PARoxetine 30 MG tablet  Commonly known as:  PAXIL  Take 45 mg by mouth daily. Take 1 and 1/2 tablets daily.     progesterone 200 MG capsule  Commonly known as:  PROMETRIUM  Take 200 mg by mouth daily.     simvastatin 20 MG tablet  Commonly known as:  ZOCOR  Take 20 mg by mouth daily.     Vitamin D 2000 UNITS Caps  Take 1 capsule by mouth daily.     ziprasidone 80 MG capsule  Commonly known as:  GEODON  Take 80 mg by mouth daily.     zolpidem 10 MG tablet  Commonly known as:  AMBIEN  Take 10 mg by mouth at bedtime.     ZZZQUIL 25 MG Caps  Generic drug:  DiphenhydrAMINE HCl (Sleep)  Take 50 mg by mouth at bedtime.         SignedPurcell Nails: Javell Blackburn Y 06/02/2015, 10:55  AM

## 2015-06-04 ENCOUNTER — Encounter (HOSPITAL_COMMUNITY): Payer: Self-pay | Admitting: Obstetrics and Gynecology

## 2015-06-13 ENCOUNTER — Other Ambulatory Visit: Payer: Self-pay | Admitting: Family Medicine

## 2015-06-13 DIAGNOSIS — Z9889 Other specified postprocedural states: Secondary | ICD-10-CM

## 2015-06-13 DIAGNOSIS — M79605 Pain in left leg: Secondary | ICD-10-CM

## 2015-06-13 DIAGNOSIS — Z780 Asymptomatic menopausal state: Secondary | ICD-10-CM

## 2015-06-15 ENCOUNTER — Ambulatory Visit
Admission: RE | Admit: 2015-06-15 | Discharge: 2015-06-15 | Disposition: A | Discharge status: Home / Self Care | Payer: BLUE CROSS/BLUE SHIELD | Source: Ambulatory Visit | Attending: Family Medicine | Admitting: Family Medicine

## 2015-06-15 DIAGNOSIS — M79605 Pain in left leg: Secondary | ICD-10-CM

## 2015-06-15 DIAGNOSIS — Z9889 Other specified postprocedural states: Secondary | ICD-10-CM

## 2015-06-15 DIAGNOSIS — Z780 Asymptomatic menopausal state: Secondary | ICD-10-CM

## 2015-07-09 ENCOUNTER — Ambulatory Visit: Payer: BLUE CROSS/BLUE SHIELD | Admitting: Physical Therapy

## 2015-07-11 ENCOUNTER — Ambulatory Visit: Payer: BLUE CROSS/BLUE SHIELD | Attending: Gastroenterology | Admitting: Physical Therapy

## 2015-07-11 ENCOUNTER — Encounter: Payer: Self-pay | Admitting: Physical Therapy

## 2015-07-11 DIAGNOSIS — N8184 Pelvic muscle wasting: Secondary | ICD-10-CM | POA: Insufficient documentation

## 2015-07-11 DIAGNOSIS — M6289 Other specified disorders of muscle: Secondary | ICD-10-CM

## 2015-07-11 NOTE — Patient Instructions (Signed)
About Abdominal Massage  Abdominal massage, also called external colon massage, is a self-treatment circular massage technique that can reduce and eliminate gas and ease constipation. The colon naturally contracts in waves in a clockwise direction starting from inside the right hip, moving up toward the ribs, across the belly, and down inside the left hip.  When you perform circular abdominal massage, you help stimulate your colon's normal wave pattern of movement called peristalsis.  It is most beneficial when done after eating.  Positioning You can practice abdominal massage with oil while lying down, or in the shower with soap.  Some people find that it is just as effective to do the massage through clothing while sitting or standing.  How to Massage Start by placing your finger tips or knuckles on your right side, just inside your hip bone.  . Make small circular movements while you move upward toward your rib cage.   . Once you reach the bottom right side of your rib cage, take your circular movements across to the left side of the bottom of your rib cage.  . Next, move downward until you reach the inside of your left hip bone.  This is the path your feces travel in your colon. . Continue to perform your abdominal massage in this pattern for 10 minutes each day.     You can apply as much pressure as is comfortable in your massage.  Start gently and build pressure as you continue to practice.  Notice any areas of pain as you massage; areas of slight pain may be relieved as you massage, but if you have areas of significant or intense pain, consult with your healthcare provider.  Other Considerations . General physical activity including bending and stretching can have a beneficial massage-like effect on the colon.  Deep breathing can also stimulate the colon because breathing deeply activates the same nervous system that supplies the colon.   . Abdominal massage should always be used in  combination with a bowel-conscious diet that is high in the proper type of fiber for you, fluids (primarily water), and a regular exercise program. Toileting Techniques for Bowel Movements (Defecation) Using your belly (abdomen) and pelvic floor muscles to have a bowel movement is usually instinctive.  Sometimes people can have problems with these muscles and have to relearn proper defecation (emptying) techniques.  If you have weakness in your muscles, organs that are falling out, decreased sensation in your pelvis, or ignore your urge to go, you may find yourself straining to have a bowel movement.  You are straining if you are: . holding your breath or taking in a huge gulp of air and holding it  . keeping your lips and jaw tensed and closed tightly . turning red in the face because of excessive pushing or forcing . developing or worsening your  hemorrhoids . getting faint while pushing . not emptying completely and have to defecate many times a day  If you are straining, you are actually making it harder for yourself to have a bowel movement.  Many people find they are pulling up with the pelvic floor muscles and closing off instead of opening the anus. Due to lack pelvic floor relaxation and coordination the abdominal muscles, one has to work harder to push the feces out.  Many people have never been taught how to defecate efficiently and effectively.  Notice what happens to your body when you are having a bowel movement.  While you are sitting on the   on the toilet pay attention to the following areas: . Jaw and mouth position . Angle of your hips   . Whether your feet touch the ground or not . Arm placement  . Spine position . Waist . Belly tension . Anus (opening of the anal canal)  An Evacuation/Defecation Plan   Here are the 4 basic points:  1. Lean forward enough for your elbows to rest on your knees 2. Support your feet on the floor or use a low stool if your feet don't touch the  floor  3. Push out your belly as if you have swallowed a beach ball-you should feel a widening of your waist 4. Open and relax your pelvic floor muscles, rather than tightening around the anus      The following conditions my require modifications to your toileting posture:  . If you have had surgery in the past that limits your back, hip, pelvic, knee or ankle flexibility . Constipation   Your healthcare practitioner may make the following additional suggestions and adjustments:  1) Sit on the toilet  a) Make sure your feet are supported. b) Notice your hip angle and spine position-most people find it effective to lean forward or raise their knees, which can help the muscles around the anus to relax  c) When you lean forward, place your forearms on your thighs for support  2) Relax suggestions a) Breath deeply in through your nose and out slowly through your mouth as if you are smelling the flowers and blowing out the candles. b) To become aware of how to relax your muscles, contracting and releasing muscles can be helpful.  Pull your pelvic floor muscles in tightly by using the image of holding back gas, or closing around the anus (visualize making a circle smaller) and lifting the anus up and in.  Then release the muscles and your anus should drop down and feel open. Repeat 5 times ending with the feeling of relaxation. c) Keep your pelvic floor muscles relaxed; let your belly bulge out. d) The digestive tract starts at the mouth and ends at the anal opening, so be sure to relax both ends of the tube.  Place your tongue on the roof of your mouth with your teeth separated.  This helps relax your mouth and will help to relax the anus at the same time.  3) Empty (defecation) a) Keep your pelvic floor and sphincter relaxed, then bulge your anal muscles.  Make the anal opening wide.  b) Stick your belly out as if you have swallowed a beach ball. c) Make your belly wall hard using your belly  muscles while continuing to breathe. Doing this makes it easier to open your anus. d) Breath out and give a grunt (or try using other sounds such as ahhhh, shhhhh, ohhhh or grrrrrrr).  4) Finish a) As you finish your bowel movement, pull the pelvic floor muscles up and in.  This will leave your anus in the proper place rather than remaining pushed out and down. If you leave your anus pushed out and down, it will start to feel as though that is normal and give you incorrect signals about needing to have a bowel movement.  Look into squatty potty  Lindsay [name], PT [facility] [address] [place], [place] [address]

## 2015-07-11 NOTE — Therapy (Signed)
[facility] [address], [address] [place], [place], [address] Phone: [phone]   Fax:  [phone]  Physical Therapy Treatment  Patient Details  Name: Lindsay [name] MRN: [id number] Date of Birth: 06-13-1961 Referring Provider: Dr. Mechele ClaudeYolanda Scarlett  Encounter Date: 07/11/2015      PT End of Session - 07/11/15 1327    Visit Number 1   Date for PT Re-Evaluation 11/08/15   PT Start Time 1230   PT Stop Time 1315   PT Time Calculation (min) 45 min   Activity Tolerance Patient tolerated treatment well   Behavior During Therapy [facility] for tasks assessed/performed      Past Medical History  Diagnosis Date  . Hypothyroidism   . Depression     OCD  . Bipolar disorder (HCC)   . Anxiety     Past Surgical History  Procedure Laterality Date  . Cesarean section times two     . Laparoscopic gastric sleeve resection    . Diagnostic laparoscopy    . Fracture surgery      on right heel   . Dilation and curettage of uterus    . Bladder suspension N/A 06/01/2015    Procedure: TRANSVAGINAL TAPE (TVT) PROCEDURE;  Surgeon: Osborn CohoAngela Roberts, MD;  Location: [facility];  Service: Gynecology;  Laterality: N/A;  . Dilatation & currettage/hysteroscopy with resectocope N/A 06/01/2015    Procedure: DILATATION & CURETTAGE/HYSTEROSCOPY WITH RESECTOCOPE;  Surgeon: Osborn CohoAngela Roberts, MD;  Location: [facility];  Service: Gynecology;  Laterality: N/A;  . Cystoscopy N/A 06/01/2015    Procedure: CYSTOSCOPY;  Surgeon: Osborn CohoAngela Roberts, MD;  Location: [facility];  Service: Gynecology;  Laterality: N/A;    There were no vitals filed for this visit.  Visit Diagnosis:  Pelvic floor dysfunction - Plan: PT plan of care cert/re-cert      Subjective Assessment - 07/11/15 1243    Subjective Patient reports she has constipation and incontinence.  Patient reports urgency.  When she has to go to the bathroom she has to rush. Patient has 1 bowel movement per week.  Patient has to  strain to go to the bathroom. Patient urinates every 2-3 hours.     Patient Stated Goals Patient wants to improve bladder issues and constipation   Currently in Pain? No/denies            [facility] - 07/11/15 0001    Assessment   Medical Diagnosis K59.00 constipation, unspecified constipation type; N81   Referring Provider Dr. Mechele ClaudeYolanda Scarlett   Onset Date/Surgical Date 06/01/15   Prior Therapy None   Precautions   Precautions Other (comment)  surgical precautions   Precaution Comments No lifting  above    Balance Screen   Has the patient fallen in the past 6 months No   Has the patient had a decrease in activity level because of a fear of falling?  No   Is the patient reluctant to leave their home because of a fear of falling?  No   Prior Function   Level of Independence Independent   Cognition   Overall Cognitive Status Within Functional Limits for tasks assessed   Observation/Other Assessments   Focus on Therapeutic Outcomes (FOTO)  57% limitation CK                  Pelvic Floor Special Questions - 07/11/15 0001    Prior Pregnancies Yes   Number of Pregnancies 3   Number of C-Sections 2   Number of  Vaginal Deliveries 1   Currently Sexually Active Yes   Is this Painful Yes   Marinoff Scale discomfort that does not affect completion   Urinary Leakage Yes   Pad use 2   Urinary urgency Yes   Falling out feeling (prolapse) No   Pelvic Floor Internal Exam Patient confirmed identification and approved physicla therapist to assess muscle strength and integrity   Exam Type Rectal   Palpation puborectalis muscle does not come forward with pelvic floor contraction, low tone for the external sphinter; difficulty to relax after a pelvic floor contraction   Strength good squeeze, good lift, able to hold agaisnt strong resistance           [facility] Adult PT Treatment/Exercise - 07/11/15 0001    Self-Care   Self-Care Other Self-Care Comments   Other  Self-Care Comments  abdominal massage to improve bowel movement   Therapeutic Activites    Therapeutic Activities Other Therapeutic Activities  toileting technique to relax pelvic floor                 PT Education - 07/11/15 1322    Education provided Yes   Education Details toileting technique, abdominal massage, squatty potty   Person(s) Educated Patient   Methods Explanation;Demonstration;Verbal cues;Handout   Comprehension Returned demonstration;Verbalized understanding          PT Short Term Goals - 07/11/15 1328    PT SHORT TERM GOAL #1   Title understand correct toileting technique to relax the pelvic floor   Time 4   Period Weeks   Status New   PT SHORT TERM GOAL #2   Title understand abdominal massage to assist in            PT Long Term Goals - 07/11/15 1657    PT LONG TERM GOAL #1   Title ability to have 2 bowel movements per week due to improved coordination of pelvic floor muscles able to relax </= 3 uv   Time 4   Period Months   Status New   PT LONG TERM GOAL #2   Title straining to go to the bathroom decreased >/= 70% due to improved coordination of the pelvic floor muscles   Time 4   Period Months   Status New   PT LONG TERM GOAL #3   Title urgency to go to the bathroom as decreased >/= 60% and can wait 5-10 minutes   Time 4   Period Months   Status New   PT LONG TERM GOAL #4   Title understand the use of moiturizers and lubricants to reduce vaginal dryness and increase comfort of intercourse   Time 4   Period Months   Status New               Plan - 07/11/15 1649    Clinical Impression Statement Patient is a 55 year old female with diagnosis of constipation and pelvic floor dysfunction.  Patient reports vaginal dryness causing pain with intercourse. Patient had transvaginal tape surgery on 06/01/2015 and surgery to left foot several weeks ago.  Patient has lifting and no intercourse precatuion for 6 weeks that end on 12/11/2015.   Patient reports no pain.  Patient  reports no urinary incontinence but has urgency since her surgery.  atietn will wear 2 big pads per day ontop of the othe but they stay dry.  Patient wears them just in case.  Pelvic floor strength annally is 4/5 with low tone on the external anal  sphinter and has difficulty with relaxing the pelvic floor after a contraction.  The puborectalis muscle does not pull forward with pelvic floor contraction. Patient has one bowel movement per week and has to strain  Patient will benefit from physical therapy to understand  how to relax the pelvic floor while having a bowel movement.    Pt will benefit from skilled therapeutic intervention in order to improve on the following deficits Decreased coordination;Decreased strength;Increased fascial restricitons   Rehab Potential Excellent   Clinical Impairments Affecting Rehab Potential None   PT Frequency 1x / week   PT Duration Other (comment)  4 months   PT Treatment/Interventions ADLs/Self Care Home Management;Neuromuscular re-education;Patient/family education;Therapeutic activities;Therapeutic exercise;Manual techniques   PT Next Visit Plan pelvic floor EMG, stretching exercises, diaphragmatic breathing   PT Home Exercise Plan diaphragmatic breathing; lubricants and moisturizers   Recommended Other Services None   Consulted and Agree with Plan of Care Patient        Problem List Patient Active Problem List   Diagnosis Date Noted  . Stress incontinence in female 06/01/2015    Eulis Foster, PT 07/11/2015 5:02 PM    [facility] [address] [place], [place], [address] Phone: [phone]   Fax:  [phone]  Name: Lindsay [name] MRN: [id number] Date of Birth: 1960/08/04

## 2015-07-18 ENCOUNTER — Ambulatory Visit: Payer: BLUE CROSS/BLUE SHIELD | Admitting: Physical Therapy

## 2015-07-18 ENCOUNTER — Encounter: Payer: Self-pay | Admitting: Physical Therapy

## 2015-07-18 DIAGNOSIS — N8184 Pelvic muscle wasting: Secondary | ICD-10-CM | POA: Diagnosis not present

## 2015-07-18 DIAGNOSIS — M6289 Other specified disorders of muscle: Secondary | ICD-10-CM

## 2015-07-18 NOTE — Therapy (Addendum)
[facility] [address] [place], [place], [address] Phone: [phone]   Fax:  [phone]  Physical Therapy Treatment  Patient Details  Name: Lindsay [name] MRN: [id number] Date of Birth: 1960-07-18 Referring Provider: Dr. Marcelyn Ditty  Encounter Date: 07/18/2015      PT End of Session - 07/18/15 1303    Visit Number 2   Date for PT Re-Evaluation 11/08/15   PT Start Time 1155   PT Stop Time 1230   PT Time Calculation (min) 35 min   Activity Tolerance Patient tolerated treatment well   Behavior During Therapy [facility] for tasks assessed/performed      Past Medical History  Diagnosis Date  . Hypothyroidism   . Depression     OCD  . Bipolar disorder ([place])   . Anxiety     Past Surgical History  Procedure Laterality Date  . Cesarean section times two     . Laparoscopic gastric sleeve resection    . Diagnostic laparoscopy    . Fracture surgery      on right heel   . Dilation and curettage of uterus    . Bladder suspension N/A 06/01/2015    Procedure: TRANSVAGINAL TAPE (TVT) PROCEDURE;  Surgeon: Everett Graff, MD;  Location: [facility];  Service: Gynecology;  Laterality: N/A;  . Dilatation & currettage/hysteroscopy with resectocope N/A 06/01/2015    Procedure: [place];  Surgeon: Everett Graff, MD;  Location: [facility];  Service: Gynecology;  Laterality: N/A;  . Cystoscopy N/A 06/01/2015    Procedure: CYSTOSCOPY;  Surgeon: Everett Graff, MD;  Location: [facility];  Service: Gynecology;  Laterality: N/A;    There were no vitals filed for this visit.  Visit Diagnosis:  Pelvic floor dysfunction      Subjective Assessment - 07/18/15 1156    Subjective I did not get a squatty potty yet.    Patient Stated Goals Patient wants to improve bladder issues and constipation   Currently in Pain? No/denies                      Pelvic Floor Special Questions -  07/18/15 0001    Biofeedback resting level 0.93 uv, 3 quick flicks max 23.55 avg. 18.88 uv; 10 second contraction 20.72uv; 20 sec contraction 18.49 uv; rst after exercise 11.05 uv  sitting   Biofeedback sensor type Surface  rectal   Biofeedback Activity --  assessment                   PT Education - 07/18/15 1202    Education provided Yes   Education Details vaginal moisturizer and lubricants, stretches   Person(s) Educated Patient   Methods Explanation;Verbal cues;Handout;Demonstration   Comprehension Verbalized understanding;Returned demonstration          PT Short Term Goals - 07/18/15 1308    PT SHORT TERM GOAL #1   Title understand correct toileting technique to relax the pelvic floor   Time 4   Period Weeks   Status On-going  has not got the squaty potty yet   PT SHORT TERM GOAL #2   Title understand abdominal massage to assist in    Time 4   Period Weeks   Status Achieved           PT Long Term Goals - 07/11/15 1657    PT LONG TERM GOAL #1   Title ability to have 2 bowel movements per week due to improved  coordination of pelvic floor muscles able to relax </= 3 uv   Time 4   Period Months   Status New   PT LONG TERM GOAL #2   Title straining to go to the bathroom decreased >/= 70% due to improved coordination of the pelvic floor muscles   Time 4   Period Months   Status New   PT LONG TERM GOAL #3   Title urgency to go to the bathroom as decreased >/= 60% and can wait 5-10 minutes   Time 4   Period Months   Status New   PT LONG TERM GOAL #4   Title understand the use of moiturizers and lubricants to reduce vaginal dryness and increase comfort of intercourse   Time 4   Period Months   Status New               Plan - 07/18/15 1229    Clinical Impression Statement Patient is a 55 year old female with diagnosis of constipation and pelvic floor dysfunction. Patient is performing abdominal massage correctly.  Pelvic floor EMG as  folllows: resting prior to exercies 4.84 uv, 3 quick flicks max 72.07 avg. 18.80, 10 second contraction 20.72 uv, 20 seond contraction 18.49, and resting level after exercise 11.05 uv.  Patient will fatique after 3 seconds for contraction and has a high resting tone. Patient reports she has difficult with YOGA so will nt be able to take a class.  Patient will benefit form physical therapy to reduce tone of pelvic floor  and increase the endurance.   patient came 10 min late   Pt will benefit from skilled therapeutic intervention in order to improve on the following deficits Decreased coordination;Decreased strength;Increased fascial restricitons   Rehab Potential Excellent   Clinical Impairments Affecting Rehab Potential None   PT Frequency 1x / week   PT Duration Other (comment)  27month   PT Treatment/Interventions ADLs/Self Care Home Management;Neuromuscular re-education;Patient/family education;Therapeutic activities;Therapeutic exercise;Manual techniques   PT Next Visit Plan , diaphragmatic breathing; relaxation tape   PT Home Exercise Plan relaxation   Consulted and Agree with Plan of Care Patient        Problem List Patient Active Problem List   Diagnosis Date Noted  . Stress incontinence in female 06/01/2015    CEarlie Counts PT 07/18/2015 1:10 PM    [facility] [address] [place] [address]: [phone]  Fax:  [phone] Name: Lindsay RAETZMRN: [id number] of Birth: 204-24-1962 PHYSICAL THERAPY DISCHARGE SUMMARY  Visits from Start of Care: 2  Current functional level related to goals / functional outcomes: See above. Patient cancelled her appointments due to going out of town and not knowing when she will be coming back into town. Patient wants to be discharge for now.    Remaining deficits: See above.    Education / Equipment: HEP  Plan: Patient agrees to discharge.   Patient goals were not met. Patient is being discharged due to the patient's request. thank you for the referral. CEarlie Counts PT 07/23/2015 3:58 PM    ?????

## 2015-07-18 NOTE — Patient Instructions (Addendum)
Lubrication . Used for intercourse to reduce friction . Avoid ones that have glycerin, warming gels, tingling gels, icing or cooling gel, scented . May need to be reapplied once or several times during sexual activity . Can be applied to both partners genitals prior to vaginal penetration to minimize friction or irritation . Prevent irritation and mucosal tears that cause post coital pain and increased the risk of vaginal and urinary tract infections . Oil-based lubricants cannot be used with condoms due to breaking them down.  Least likely to irritate vaginal tissue.  . Plant based-lubes are safe . Silicone-based lubrication are thicker and last long and used for post-menopausal women Types of Lubricants . Good Clean Love . Slippery Stuff . Sylk . Blossom Organics . Leatrice Jewels . Coconut oil . Aloe Vera . Sliquid  Things to avoid in the vaginal area . Do not use things to irritate the vulvar area . No lotions . No soaps; can use Aveeno or Calendula cleanser if needed. Must be gentle . No deodorants . No douches . Good to sleep without underwear to let the vaginal area to air out . No scrubbing: spread the lips to let warm water rinse over labias and pat dry  Moisturizers . They are used in the vagina to hydrate the mucous membrane that make up the vaginal canal. . Designed to keep a more normal acid balance (ph) . Once placed in the vagina, it will last between two to three days.  . Use 2-3 times per week at bedtime and last longer than 60 min. . Ingredients to avoid is glycerin and fragrance, can increase chance of infection . Should not be used just before sex due to causing irritation . Most are gels administered either in a tampon-shaped applicator or as a vaginal suppository. They are non-hormonal.   Types of Moisturizers . Replens . Leatrice Jewels . Vitamin E vaginal suppositories . Moist Again . Feminease . Coconut oil- can break down condoms . Hyalo  Things to avoid in the  vaginal area . Do not use things to irritate the vulvar area . No lotions . No soaps; can use Aveeno or Calendula cleanser if needed. Must be gentle . No deodorants . No douches . Good to sleep without underwear to let the vaginal area to air out . No scrubbing: spread the lips to let warm water rinse over labias and pat dry   Butterfly, Supine    Lie on back, feet together. Lower knees toward floor. Hold _30__ seconds. Repeat _2__ times per session. Do _1-2__ sessions per day.  Copyright  VHI. All rights reserved.  Piriformis Stretch, Supine    Lie supine, legs bent, feet flat. Raise one bent leg and, grasping ankle with both hands, pull leg toward opposite shoulder. Hold 30___ seconds.  Repeat _2__ times per session. Do _1__ sessions per day. Perform with other leg straight.  Copyright  VHI. All rights reserved.  Quads / HF, Supine    Lie near edge of bed, one leg bent, foot flat on bed. Other leg hanging over edge, relaxed, thigh resting entirely on bed. Bend hanging knee backward keeping thigh in contact with bed. Hold 30___ seconds.  Repeat _2__ times per session. Do _1__ sessions per day.  Copyright  VHI. All rights reserved.   Chair Sitting    Sit at edge of seat, spine straight, one leg extended. Put a hand on each thigh and bend forward from the hip, keeping spine straight. Allow hand on extended leg  to reach toward toes. Support upper body with other arm. Hold _30__ seconds. Repeat _2__ times per session. Do _1__ sessions per day.  Copyright  VHI. All rights reserved.  [facility] [address] [place], [place] [address] Phone # [phone] Fax [phone]

## 2015-07-25 ENCOUNTER — Ambulatory Visit: Payer: BLUE CROSS/BLUE SHIELD | Admitting: Physical Therapy

## 2015-08-01 ENCOUNTER — Encounter: Payer: BLUE CROSS/BLUE SHIELD | Admitting: Physical Therapy

## 2016-02-12 ENCOUNTER — Other Ambulatory Visit: Payer: Self-pay | Admitting: Family Medicine

## 2016-02-12 ENCOUNTER — Other Ambulatory Visit: Payer: Self-pay | Admitting: Nurse Practitioner

## 2016-02-12 DIAGNOSIS — Z1231 Encounter for screening mammogram for malignant neoplasm of breast: Secondary | ICD-10-CM

## 2016-02-21 ENCOUNTER — Ambulatory Visit
Admission: RE | Admit: 2016-02-21 | Discharge: 2016-02-21 | Disposition: A | Discharge status: Home / Self Care | Payer: BLUE CROSS/BLUE SHIELD | Source: Ambulatory Visit | Attending: Family Medicine | Admitting: Family Medicine

## 2016-02-21 DIAGNOSIS — Z1231 Encounter for screening mammogram for malignant neoplasm of breast: Secondary | ICD-10-CM

## 2016-05-02 DIAGNOSIS — F3341 Major depressive disorder, recurrent, in partial remission: Secondary | ICD-10-CM | POA: Diagnosis not present

## 2016-05-07 DIAGNOSIS — F3132 Bipolar disorder, current episode depressed, moderate: Secondary | ICD-10-CM | POA: Diagnosis not present

## 2016-05-27 DIAGNOSIS — L573 Poikiloderma of Civatte: Secondary | ICD-10-CM | POA: Diagnosis not present

## 2016-05-29 DIAGNOSIS — F3341 Major depressive disorder, recurrent, in partial remission: Secondary | ICD-10-CM | POA: Diagnosis not present

## 2016-06-19 DIAGNOSIS — F3341 Major depressive disorder, recurrent, in partial remission: Secondary | ICD-10-CM | POA: Diagnosis not present

## 2016-07-09 DIAGNOSIS — F3132 Bipolar disorder, current episode depressed, moderate: Secondary | ICD-10-CM | POA: Diagnosis not present

## 2016-07-11 DIAGNOSIS — F3341 Major depressive disorder, recurrent, in partial remission: Secondary | ICD-10-CM | POA: Diagnosis not present

## 2016-09-10 DIAGNOSIS — Z6831 Body mass index (BMI) 31.0-31.9, adult: Secondary | ICD-10-CM | POA: Diagnosis not present

## 2016-09-10 DIAGNOSIS — Z124 Encounter for screening for malignant neoplasm of cervix: Secondary | ICD-10-CM | POA: Diagnosis not present

## 2016-09-10 DIAGNOSIS — Z01419 Encounter for gynecological examination (general) (routine) without abnormal findings: Secondary | ICD-10-CM | POA: Diagnosis not present

## 2016-09-17 DIAGNOSIS — N959 Unspecified menopausal and perimenopausal disorder: Secondary | ICD-10-CM | POA: Diagnosis not present

## 2016-09-17 DIAGNOSIS — E039 Hypothyroidism, unspecified: Secondary | ICD-10-CM | POA: Diagnosis not present

## 2016-09-17 DIAGNOSIS — Z6832 Body mass index (BMI) 32.0-32.9, adult: Secondary | ICD-10-CM | POA: Diagnosis not present

## 2016-09-18 DIAGNOSIS — F3132 Bipolar disorder, current episode depressed, moderate: Secondary | ICD-10-CM | POA: Diagnosis not present

## 2016-10-21 DIAGNOSIS — F3132 Bipolar disorder, current episode depressed, moderate: Secondary | ICD-10-CM | POA: Diagnosis not present

## 2016-10-22 DIAGNOSIS — D225 Melanocytic nevi of trunk: Secondary | ICD-10-CM | POA: Diagnosis not present

## 2016-10-22 DIAGNOSIS — L821 Other seborrheic keratosis: Secondary | ICD-10-CM | POA: Diagnosis not present

## 2016-10-22 DIAGNOSIS — L814 Other melanin hyperpigmentation: Secondary | ICD-10-CM | POA: Diagnosis not present

## 2016-10-22 DIAGNOSIS — L700 Acne vulgaris: Secondary | ICD-10-CM | POA: Diagnosis not present

## 2016-10-24 DIAGNOSIS — Z0000 Encounter for general adult medical examination without abnormal findings: Secondary | ICD-10-CM | POA: Diagnosis not present

## 2016-10-24 DIAGNOSIS — E785 Hyperlipidemia, unspecified: Secondary | ICD-10-CM | POA: Diagnosis not present

## 2016-11-04 DIAGNOSIS — F3132 Bipolar disorder, current episode depressed, moderate: Secondary | ICD-10-CM | POA: Diagnosis not present

## 2016-11-13 DIAGNOSIS — F3132 Bipolar disorder, current episode depressed, moderate: Secondary | ICD-10-CM | POA: Diagnosis not present

## 2016-11-18 DIAGNOSIS — F3132 Bipolar disorder, current episode depressed, moderate: Secondary | ICD-10-CM | POA: Diagnosis not present

## 2016-12-11 DIAGNOSIS — F429 Obsessive-compulsive disorder, unspecified: Secondary | ICD-10-CM | POA: Diagnosis not present

## 2016-12-11 DIAGNOSIS — F319 Bipolar disorder, unspecified: Secondary | ICD-10-CM | POA: Diagnosis not present

## 2016-12-12 DIAGNOSIS — F3132 Bipolar disorder, current episode depressed, moderate: Secondary | ICD-10-CM | POA: Diagnosis not present

## 2016-12-23 DIAGNOSIS — R42 Dizziness and giddiness: Secondary | ICD-10-CM | POA: Diagnosis not present

## 2016-12-23 DIAGNOSIS — I959 Hypotension, unspecified: Secondary | ICD-10-CM | POA: Diagnosis not present

## 2016-12-23 DIAGNOSIS — R5383 Other fatigue: Secondary | ICD-10-CM | POA: Diagnosis not present

## 2016-12-24 DIAGNOSIS — F319 Bipolar disorder, unspecified: Secondary | ICD-10-CM | POA: Diagnosis not present

## 2016-12-25 DIAGNOSIS — R9431 Abnormal electrocardiogram [ECG] [EKG]: Secondary | ICD-10-CM | POA: Diagnosis not present

## 2016-12-26 DIAGNOSIS — F319 Bipolar disorder, unspecified: Secondary | ICD-10-CM | POA: Diagnosis not present

## 2016-12-29 DIAGNOSIS — F319 Bipolar disorder, unspecified: Secondary | ICD-10-CM | POA: Diagnosis not present

## 2016-12-29 DIAGNOSIS — F209 Schizophrenia, unspecified: Secondary | ICD-10-CM | POA: Diagnosis not present

## 2017-01-02 DIAGNOSIS — F339 Major depressive disorder, recurrent, unspecified: Secondary | ICD-10-CM | POA: Diagnosis not present

## 2017-01-02 DIAGNOSIS — K5900 Constipation, unspecified: Secondary | ICD-10-CM | POA: Diagnosis not present

## 2017-01-02 DIAGNOSIS — I959 Hypotension, unspecified: Secondary | ICD-10-CM | POA: Diagnosis not present

## 2017-01-02 DIAGNOSIS — F319 Bipolar disorder, unspecified: Secondary | ICD-10-CM | POA: Diagnosis not present

## 2017-01-05 DIAGNOSIS — F319 Bipolar disorder, unspecified: Secondary | ICD-10-CM | POA: Diagnosis not present

## 2017-01-07 DIAGNOSIS — F319 Bipolar disorder, unspecified: Secondary | ICD-10-CM | POA: Diagnosis not present

## 2017-01-07 DIAGNOSIS — F331 Major depressive disorder, recurrent, moderate: Secondary | ICD-10-CM | POA: Diagnosis not present

## 2017-01-09 DIAGNOSIS — F319 Bipolar disorder, unspecified: Secondary | ICD-10-CM | POA: Diagnosis not present

## 2017-01-09 DIAGNOSIS — F39 Unspecified mood [affective] disorder: Secondary | ICD-10-CM | POA: Diagnosis not present

## 2017-01-12 DIAGNOSIS — F319 Bipolar disorder, unspecified: Secondary | ICD-10-CM | POA: Diagnosis not present

## 2017-01-14 DIAGNOSIS — F319 Bipolar disorder, unspecified: Secondary | ICD-10-CM | POA: Diagnosis not present

## 2017-01-14 DIAGNOSIS — F331 Major depressive disorder, recurrent, moderate: Secondary | ICD-10-CM | POA: Diagnosis not present

## 2017-01-16 DIAGNOSIS — F331 Major depressive disorder, recurrent, moderate: Secondary | ICD-10-CM | POA: Diagnosis not present

## 2017-01-16 DIAGNOSIS — F319 Bipolar disorder, unspecified: Secondary | ICD-10-CM | POA: Diagnosis not present

## 2017-01-22 DIAGNOSIS — F429 Obsessive-compulsive disorder, unspecified: Secondary | ICD-10-CM | POA: Diagnosis not present

## 2017-01-22 DIAGNOSIS — F319 Bipolar disorder, unspecified: Secondary | ICD-10-CM | POA: Diagnosis not present

## 2017-01-23 DIAGNOSIS — F3132 Bipolar disorder, current episode depressed, moderate: Secondary | ICD-10-CM | POA: Diagnosis not present

## 2017-01-26 DIAGNOSIS — I959 Hypotension, unspecified: Secondary | ICD-10-CM | POA: Diagnosis not present

## 2017-01-26 DIAGNOSIS — R42 Dizziness and giddiness: Secondary | ICD-10-CM | POA: Diagnosis not present

## 2017-01-28 DIAGNOSIS — R42 Dizziness and giddiness: Secondary | ICD-10-CM | POA: Diagnosis not present

## 2017-01-28 DIAGNOSIS — N959 Unspecified menopausal and perimenopausal disorder: Secondary | ICD-10-CM | POA: Diagnosis not present

## 2017-01-28 DIAGNOSIS — E039 Hypothyroidism, unspecified: Secondary | ICD-10-CM | POA: Diagnosis not present

## 2017-01-29 DIAGNOSIS — E039 Hypothyroidism, unspecified: Secondary | ICD-10-CM | POA: Diagnosis not present

## 2017-01-29 DIAGNOSIS — E668 Other obesity: Secondary | ICD-10-CM | POA: Diagnosis not present

## 2017-01-29 DIAGNOSIS — I9589 Other hypotension: Secondary | ICD-10-CM | POA: Diagnosis not present

## 2017-01-29 DIAGNOSIS — R42 Dizziness and giddiness: Secondary | ICD-10-CM | POA: Diagnosis not present

## 2017-01-29 DIAGNOSIS — E785 Hyperlipidemia, unspecified: Secondary | ICD-10-CM | POA: Diagnosis not present

## 2017-01-30 DIAGNOSIS — F3181 Bipolar II disorder: Secondary | ICD-10-CM | POA: Diagnosis not present

## 2017-02-06 DIAGNOSIS — F3181 Bipolar II disorder: Secondary | ICD-10-CM | POA: Diagnosis not present

## 2017-02-09 DIAGNOSIS — I959 Hypotension, unspecified: Secondary | ICD-10-CM | POA: Diagnosis not present

## 2017-02-09 DIAGNOSIS — R42 Dizziness and giddiness: Secondary | ICD-10-CM | POA: Diagnosis not present

## 2017-02-11 DIAGNOSIS — E0580 Other thyrotoxicosis without thyrotoxic crisis or storm: Secondary | ICD-10-CM | POA: Diagnosis not present

## 2017-02-11 DIAGNOSIS — E049 Nontoxic goiter, unspecified: Secondary | ICD-10-CM | POA: Diagnosis not present

## 2017-02-11 DIAGNOSIS — E039 Hypothyroidism, unspecified: Secondary | ICD-10-CM | POA: Diagnosis not present

## 2017-02-13 DIAGNOSIS — F3181 Bipolar II disorder: Secondary | ICD-10-CM | POA: Diagnosis not present

## 2017-02-16 DIAGNOSIS — F3132 Bipolar disorder, current episode depressed, moderate: Secondary | ICD-10-CM | POA: Diagnosis not present

## 2017-02-18 DIAGNOSIS — K5901 Slow transit constipation: Secondary | ICD-10-CM | POA: Diagnosis not present

## 2017-02-19 DIAGNOSIS — F429 Obsessive-compulsive disorder, unspecified: Secondary | ICD-10-CM | POA: Diagnosis not present

## 2017-02-19 DIAGNOSIS — F3181 Bipolar II disorder: Secondary | ICD-10-CM | POA: Diagnosis not present

## 2017-02-19 DIAGNOSIS — F319 Bipolar disorder, unspecified: Secondary | ICD-10-CM | POA: Diagnosis not present

## 2017-02-20 DIAGNOSIS — F3132 Bipolar disorder, current episode depressed, moderate: Secondary | ICD-10-CM | POA: Diagnosis not present

## 2017-02-20 DIAGNOSIS — F3011 Manic episode without psychotic symptoms, mild: Secondary | ICD-10-CM | POA: Diagnosis not present

## 2017-02-23 DIAGNOSIS — Z1231 Encounter for screening mammogram for malignant neoplasm of breast: Secondary | ICD-10-CM | POA: Diagnosis not present

## 2017-02-24 ENCOUNTER — Other Ambulatory Visit: Payer: Self-pay | Admitting: Gastroenterology

## 2017-02-24 ENCOUNTER — Ambulatory Visit
Admission: RE | Admit: 2017-02-24 | Discharge: 2017-02-24 | Disposition: A | Discharge status: Home / Self Care | Payer: BLUE CROSS/BLUE SHIELD | Source: Ambulatory Visit | Attending: Gastroenterology | Admitting: Gastroenterology

## 2017-02-24 DIAGNOSIS — K5900 Constipation, unspecified: Secondary | ICD-10-CM | POA: Diagnosis not present

## 2017-02-24 DIAGNOSIS — R1084 Generalized abdominal pain: Secondary | ICD-10-CM | POA: Diagnosis not present

## 2017-02-26 DIAGNOSIS — F3181 Bipolar II disorder: Secondary | ICD-10-CM | POA: Diagnosis not present

## 2017-03-04 DIAGNOSIS — F3181 Bipolar II disorder: Secondary | ICD-10-CM | POA: Diagnosis not present

## 2017-03-17 DIAGNOSIS — Z800 Family history of malignant neoplasm of digestive organs: Secondary | ICD-10-CM | POA: Diagnosis not present

## 2017-03-17 DIAGNOSIS — K5900 Constipation, unspecified: Secondary | ICD-10-CM | POA: Diagnosis not present

## 2017-03-18 DIAGNOSIS — F3132 Bipolar disorder, current episode depressed, moderate: Secondary | ICD-10-CM | POA: Diagnosis not present

## 2017-03-20 DIAGNOSIS — F428 Other obsessive-compulsive disorder: Secondary | ICD-10-CM | POA: Diagnosis not present

## 2017-03-20 DIAGNOSIS — F319 Bipolar disorder, unspecified: Secondary | ICD-10-CM | POA: Diagnosis not present

## 2017-03-20 DIAGNOSIS — Z6841 Body Mass Index (BMI) 40.0 and over, adult: Secondary | ICD-10-CM | POA: Diagnosis not present

## 2017-03-20 DIAGNOSIS — Z79899 Other long term (current) drug therapy: Secondary | ICD-10-CM | POA: Diagnosis not present

## 2017-03-20 DIAGNOSIS — F419 Anxiety disorder, unspecified: Secondary | ICD-10-CM | POA: Diagnosis not present

## 2017-03-20 DIAGNOSIS — Z79890 Hormone replacement therapy (postmenopausal): Secondary | ICD-10-CM | POA: Diagnosis not present

## 2017-03-20 DIAGNOSIS — E039 Hypothyroidism, unspecified: Secondary | ICD-10-CM | POA: Diagnosis not present

## 2017-04-01 DIAGNOSIS — Z23 Encounter for immunization: Secondary | ICD-10-CM | POA: Diagnosis not present

## 2017-04-01 DIAGNOSIS — E038 Other specified hypothyroidism: Secondary | ICD-10-CM | POA: Diagnosis not present

## 2017-04-14 DIAGNOSIS — E039 Hypothyroidism, unspecified: Secondary | ICD-10-CM | POA: Diagnosis not present

## 2017-04-14 DIAGNOSIS — F3132 Bipolar disorder, current episode depressed, moderate: Secondary | ICD-10-CM | POA: Diagnosis not present

## 2017-04-14 DIAGNOSIS — E049 Nontoxic goiter, unspecified: Secondary | ICD-10-CM | POA: Diagnosis not present

## 2017-04-16 DIAGNOSIS — F3181 Bipolar II disorder: Secondary | ICD-10-CM | POA: Diagnosis not present

## 2017-04-22 DIAGNOSIS — F319 Bipolar disorder, unspecified: Secondary | ICD-10-CM | POA: Diagnosis not present

## 2017-04-22 DIAGNOSIS — F3189 Other bipolar disorder: Secondary | ICD-10-CM | POA: Diagnosis not present

## 2017-04-23 DIAGNOSIS — F3181 Bipolar II disorder: Secondary | ICD-10-CM | POA: Diagnosis not present

## 2017-04-28 DIAGNOSIS — E039 Hypothyroidism, unspecified: Secondary | ICD-10-CM | POA: Diagnosis not present

## 2017-05-04 DIAGNOSIS — Z1211 Encounter for screening for malignant neoplasm of colon: Secondary | ICD-10-CM | POA: Diagnosis not present

## 2017-05-04 DIAGNOSIS — Z800 Family history of malignant neoplasm of digestive organs: Secondary | ICD-10-CM | POA: Diagnosis not present

## 2017-05-04 DIAGNOSIS — K640 First degree hemorrhoids: Secondary | ICD-10-CM | POA: Diagnosis not present

## 2017-05-20 DIAGNOSIS — F319 Bipolar disorder, unspecified: Secondary | ICD-10-CM | POA: Diagnosis not present

## 2017-05-25 DIAGNOSIS — E039 Hypothyroidism, unspecified: Secondary | ICD-10-CM | POA: Diagnosis not present

## 2017-05-26 DIAGNOSIS — F3132 Bipolar disorder, current episode depressed, moderate: Secondary | ICD-10-CM | POA: Diagnosis not present

## 2017-07-13 DIAGNOSIS — F3132 Bipolar disorder, current episode depressed, moderate: Secondary | ICD-10-CM | POA: Diagnosis not present

## 2017-08-10 DIAGNOSIS — F3132 Bipolar disorder, current episode depressed, moderate: Secondary | ICD-10-CM | POA: Diagnosis not present

## 2017-08-12 DIAGNOSIS — Z6839 Body mass index (BMI) 39.0-39.9, adult: Secondary | ICD-10-CM | POA: Diagnosis not present

## 2017-08-12 DIAGNOSIS — N959 Unspecified menopausal and perimenopausal disorder: Secondary | ICD-10-CM | POA: Diagnosis not present

## 2017-09-08 DIAGNOSIS — H02413 Mechanical ptosis of bilateral eyelids: Secondary | ICD-10-CM | POA: Diagnosis not present

## 2017-09-09 DIAGNOSIS — H02413 Mechanical ptosis of bilateral eyelids: Secondary | ICD-10-CM | POA: Diagnosis not present

## 2017-10-05 DIAGNOSIS — F3132 Bipolar disorder, current episode depressed, moderate: Secondary | ICD-10-CM | POA: Diagnosis not present

## 2017-11-10 DIAGNOSIS — L988 Other specified disorders of the skin and subcutaneous tissue: Secondary | ICD-10-CM | POA: Diagnosis not present

## 2017-11-10 DIAGNOSIS — L819 Disorder of pigmentation, unspecified: Secondary | ICD-10-CM | POA: Diagnosis not present

## 2017-11-10 DIAGNOSIS — L814 Other melanin hyperpigmentation: Secondary | ICD-10-CM | POA: Diagnosis not present

## 2017-11-10 DIAGNOSIS — L821 Other seborrheic keratosis: Secondary | ICD-10-CM | POA: Diagnosis not present

## 2017-11-12 DIAGNOSIS — M79672 Pain in left foot: Secondary | ICD-10-CM | POA: Diagnosis not present

## 2017-11-12 DIAGNOSIS — M79671 Pain in right foot: Secondary | ICD-10-CM | POA: Diagnosis not present

## 2017-11-12 DIAGNOSIS — E039 Hypothyroidism, unspecified: Secondary | ICD-10-CM | POA: Diagnosis not present

## 2017-11-12 DIAGNOSIS — G4719 Other hypersomnia: Secondary | ICD-10-CM | POA: Diagnosis not present

## 2017-11-18 DIAGNOSIS — F3132 Bipolar disorder, current episode depressed, moderate: Secondary | ICD-10-CM | POA: Diagnosis not present

## 2017-12-01 DIAGNOSIS — S60021A Contusion of right index finger without damage to nail, initial encounter: Secondary | ICD-10-CM | POA: Diagnosis not present

## 2017-12-01 DIAGNOSIS — J069 Acute upper respiratory infection, unspecified: Secondary | ICD-10-CM | POA: Diagnosis not present

## 2017-12-02 DIAGNOSIS — H02412 Mechanical ptosis of left eyelid: Secondary | ICD-10-CM | POA: Diagnosis not present

## 2017-12-02 DIAGNOSIS — H02413 Mechanical ptosis of bilateral eyelids: Secondary | ICD-10-CM | POA: Diagnosis not present

## 2017-12-02 DIAGNOSIS — H02411 Mechanical ptosis of right eyelid: Secondary | ICD-10-CM | POA: Diagnosis not present

## 2017-12-02 DIAGNOSIS — Z01818 Encounter for other preprocedural examination: Secondary | ICD-10-CM | POA: Diagnosis not present

## 2018-01-07 DIAGNOSIS — G47419 Narcolepsy without cataplexy: Secondary | ICD-10-CM | POA: Diagnosis not present

## 2018-01-07 DIAGNOSIS — G4721 Circadian rhythm sleep disorder, delayed sleep phase type: Secondary | ICD-10-CM | POA: Diagnosis not present

## 2018-01-26 DIAGNOSIS — F3132 Bipolar disorder, current episode depressed, moderate: Secondary | ICD-10-CM | POA: Diagnosis not present

## 2018-02-02 DIAGNOSIS — F3132 Bipolar disorder, current episode depressed, moderate: Secondary | ICD-10-CM | POA: Diagnosis not present

## 2018-02-10 DIAGNOSIS — F3132 Bipolar disorder, current episode depressed, moderate: Secondary | ICD-10-CM | POA: Diagnosis not present

## 2018-02-25 DIAGNOSIS — F3013 Manic episode, severe, without psychotic symptoms: Secondary | ICD-10-CM | POA: Diagnosis not present

## 2018-03-04 DIAGNOSIS — E039 Hypothyroidism, unspecified: Secondary | ICD-10-CM | POA: Diagnosis not present

## 2018-03-04 DIAGNOSIS — G47419 Narcolepsy without cataplexy: Secondary | ICD-10-CM | POA: Diagnosis not present

## 2018-03-04 DIAGNOSIS — R61 Generalized hyperhidrosis: Secondary | ICD-10-CM | POA: Diagnosis not present

## 2018-03-04 DIAGNOSIS — R400 Somnolence: Secondary | ICD-10-CM | POA: Diagnosis not present

## 2018-03-11 DIAGNOSIS — F3013 Manic episode, severe, without psychotic symptoms: Secondary | ICD-10-CM | POA: Diagnosis not present

## 2018-03-13 ENCOUNTER — Encounter: Payer: Self-pay | Admitting: *Deleted

## 2018-03-23 DIAGNOSIS — M2042 Other hammer toe(s) (acquired), left foot: Secondary | ICD-10-CM | POA: Diagnosis not present

## 2018-03-23 DIAGNOSIS — S86111A Strain of other muscle(s) and tendon(s) of posterior muscle group at lower leg level, right leg, initial encounter: Secondary | ICD-10-CM | POA: Diagnosis not present

## 2018-03-24 DIAGNOSIS — M9914 Subluxation complex (vertebral) of sacral region: Secondary | ICD-10-CM | POA: Diagnosis not present

## 2018-03-24 DIAGNOSIS — M9905 Segmental and somatic dysfunction of pelvic region: Secondary | ICD-10-CM | POA: Diagnosis not present

## 2018-03-24 DIAGNOSIS — M9903 Segmental and somatic dysfunction of lumbar region: Secondary | ICD-10-CM | POA: Diagnosis not present

## 2018-03-24 DIAGNOSIS — M5116 Intervertebral disc disorders with radiculopathy, lumbar region: Secondary | ICD-10-CM | POA: Diagnosis not present

## 2018-03-26 DIAGNOSIS — M9905 Segmental and somatic dysfunction of pelvic region: Secondary | ICD-10-CM | POA: Diagnosis not present

## 2018-03-26 DIAGNOSIS — M9914 Subluxation complex (vertebral) of sacral region: Secondary | ICD-10-CM | POA: Diagnosis not present

## 2018-03-26 DIAGNOSIS — M5116 Intervertebral disc disorders with radiculopathy, lumbar region: Secondary | ICD-10-CM | POA: Diagnosis not present

## 2018-03-26 DIAGNOSIS — M9903 Segmental and somatic dysfunction of lumbar region: Secondary | ICD-10-CM | POA: Diagnosis not present

## 2018-03-29 DIAGNOSIS — M9914 Subluxation complex (vertebral) of sacral region: Secondary | ICD-10-CM | POA: Diagnosis not present

## 2018-03-29 DIAGNOSIS — M9903 Segmental and somatic dysfunction of lumbar region: Secondary | ICD-10-CM | POA: Diagnosis not present

## 2018-03-29 DIAGNOSIS — M5116 Intervertebral disc disorders with radiculopathy, lumbar region: Secondary | ICD-10-CM | POA: Diagnosis not present

## 2018-03-29 DIAGNOSIS — M9905 Segmental and somatic dysfunction of pelvic region: Secondary | ICD-10-CM | POA: Diagnosis not present

## 2018-03-30 ENCOUNTER — Telehealth: Payer: Self-pay | Admitting: Physician Assistant

## 2018-03-30 DIAGNOSIS — M9914 Subluxation complex (vertebral) of sacral region: Secondary | ICD-10-CM | POA: Diagnosis not present

## 2018-03-30 DIAGNOSIS — M9903 Segmental and somatic dysfunction of lumbar region: Secondary | ICD-10-CM | POA: Diagnosis not present

## 2018-03-30 DIAGNOSIS — M9905 Segmental and somatic dysfunction of pelvic region: Secondary | ICD-10-CM | POA: Diagnosis not present

## 2018-03-30 DIAGNOSIS — M5116 Intervertebral disc disorders with radiculopathy, lumbar region: Secondary | ICD-10-CM | POA: Diagnosis not present

## 2018-03-30 MED ORDER — LAMOTRIGINE 200 MG PO TABS: 200.0000 mg | ORAL_TABLET | Freq: Every day | ORAL | 0 refills | Status: DC

## 2018-03-30 NOTE — Telephone Encounter (Signed)
SHE NEEDS HER 200MG  LAMICTAL CALLED INTO [facility] HAS APPT 10/3 BUT IS OUT

## 2018-03-30 NOTE — Telephone Encounter (Signed)
Patient called requesting refill of lamictal 200mg /day. RX sent to [facility]. Pt has follow up 04/01/2018

## 2018-04-01 ENCOUNTER — Encounter: Payer: Self-pay | Admitting: Physician Assistant

## 2018-04-01 ENCOUNTER — Ambulatory Visit: Payer: BLUE CROSS/BLUE SHIELD | Admitting: Physician Assistant

## 2018-04-01 DIAGNOSIS — F422 Mixed obsessional thoughts and acts: Secondary | ICD-10-CM | POA: Diagnosis not present

## 2018-04-01 DIAGNOSIS — M9903 Segmental and somatic dysfunction of lumbar region: Secondary | ICD-10-CM | POA: Diagnosis not present

## 2018-04-01 DIAGNOSIS — F319 Bipolar disorder, unspecified: Secondary | ICD-10-CM | POA: Diagnosis not present

## 2018-04-01 DIAGNOSIS — M9914 Subluxation complex (vertebral) of sacral region: Secondary | ICD-10-CM | POA: Diagnosis not present

## 2018-04-01 DIAGNOSIS — M9905 Segmental and somatic dysfunction of pelvic region: Secondary | ICD-10-CM | POA: Diagnosis not present

## 2018-04-01 DIAGNOSIS — M5116 Intervertebral disc disorders with radiculopathy, lumbar region: Secondary | ICD-10-CM | POA: Diagnosis not present

## 2018-04-01 MED ORDER — QUETIAPINE FUMARATE 200 MG PO TABS: 200.0000 mg | ORAL_TABLET | Freq: Every day | ORAL | 1 refills | Status: DC

## 2018-04-01 MED ORDER — LISDEXAMFETAMINE DIMESYLATE 10 MG PO CAPS: 10.0000 mg | ORAL_CAPSULE | Freq: Every day | ORAL | 0 refills | Status: DC

## 2018-04-01 MED ORDER — PAROXETINE HCL 30 MG PO TABS: 45.0000 mg | ORAL_TABLET | Freq: Every day | ORAL | 1 refills | Status: DC

## 2018-04-01 NOTE — Progress Notes (Signed)
[facility] Check  Patient ID: Lindsay [name],  MRN: [id number]  PCP: [name], MD (Inactive)  Date of Evaluation: 04/01/2018 Time spent:15 minutes   HISTORY/CURRENT STATUS: HPI Lindsay [name] presents for four-week medication checkup.  In the interim states she has been doing very well.  She has had no further manic symptoms since the end of July.  No increased energy with decreased need for sleep, no impulsivity/risky behavior/increased libido/increased spending/gambling/or grandiosity. Denies anhedonia, increased crying, decreased energy or motivation, or isolating. Her obsessive-compulsive symptoms are controlled.  She states she will probably always have some symptoms however they are not controlling her at this time.  She does not repeatedly check things and does not obsess over things that used to bother her a lot. She sleeps well.  She continues to be unable to work.  She walks approximately 6 miles every day, and states she feels much better when she gets that much exercise. States the Vyvanse at 10 mg has been really helpful with her mood and has not caused any manic symptoms at all. States she felt uncomfortable on a higher dose of Seroquel, which was increased over the summer when she was more manic.  She has been cutting the Seroquel in half to equal 150 mg.  Individual Medical History/ Review of Systems: Changes? :No  Allergies: Tetracyclines & related  Current Medications:  Current Outpatient Medications:  .  aspirin 81 MG tablet, Take 81 mg by mouth daily., Disp: , Rfl:  .  Cholecalciferol (VITAMIN D) 2000 UNITS CAPS, Take 1 capsule by mouth daily., Disp: , Rfl:  .  ciprofloxacin (CIPRO) 250 MG tablet, Take 1 tablet (250 mg total) by mouth every 12 (twelve) hours. For 3 days (Patient not taking: Reported on 07/11/2015), Disp: 6 tablet, Rfl: 0 .  DiphenhydrAMINE HCl, Sleep, (ZZZQUIL) 25 MG CAPS, Take 50 mg by mouth at bedtime. Reported on 07/11/2015, Disp: , Rfl:  .   Estradiol (ELESTRIN) 0.52 MG/0.87 GM (0.06%) GEL, Apply 1-1.5 application topically daily. Apply 1 application every other night, and on opposite nights apply 1.5 applications., Disp: , Rfl:  .  ibuprofen (ADVIL,MOTRIN) 600 MG tablet, Take 1 tablet (600 mg total) by mouth every 6 (six) hours as needed (mild pain). (Patient not taking: Reported on 07/11/2015), Disp: 30 tablet, Rfl: 0 .  lamoTRIgine (LAMICTAL) 200 MG tablet, Take 1 tablet (200 mg total) by mouth daily., Disp: 30 tablet, Rfl: 0 .  levothyroxine (SYNTHROID, LEVOTHROID) 150 MCG tablet, Take 150 mcg by mouth daily before breakfast., Disp: , Rfl:  .  [START ON 04/09/2018] lisdexamfetamine (VYVANSE) 10 MG capsule, Take 1 capsule (10 mg total) by mouth daily., Disp: 30 capsule, Rfl: 0 .  [START ON 05/09/2018] lisdexamfetamine (VYVANSE) 10 MG capsule, Take 1 capsule (10 mg total) by mouth daily., Disp: 30 capsule, Rfl: 0 .  Magnesium Oxide 250 MG TABS, Take 750 tablets by mouth daily., Disp: , Rfl:  .  oxyCODONE-acetaminophen (PERCOCET/ROXICET) 5-325 MG tablet, Take 1 tablet by mouth every 6 (six) hours as needed for moderate pain or severe pain (moderate to severe pain (when tolerating fluids)). (Patient not taking: Reported on 07/11/2015), Disp: 30 tablet, Rfl: 0 .  PARoxetine (PAXIL) 30 MG tablet, Take 1.5 tablets (45 mg total) by mouth daily. Take 1 and 1/2 tablets daily., Disp: 135 tablet, Rfl: 1 .  progesterone (PROMETRIUM) 200 MG capsule, Take 200 mg by mouth daily. Reported on 07/11/2015, Disp: , Rfl:  .  QUEtiapine (SEROQUEL) 200 MG tablet, Take 1 tablet (  200 mg total) by mouth at bedtime., Disp: 90 tablet, Rfl: 1 .  simvastatin (ZOCOR) 20 MG tablet, Take 20 mg by mouth daily., Disp: , Rfl:  .  traZODone (DESYREL) 100 MG tablet, Take 100 mg by mouth at bedtime., Disp: , Rfl:  .  ziprasidone (GEODON) 80 MG capsule, Take 80 mg by mouth daily., Disp: , Rfl:  .  zolpidem (AMBIEN) 10 MG tablet, Take 10 mg by mouth at bedtime. Reported on  07/11/2015, Disp: , Rfl:  Medication Side Effects: None  Family Medical/ Social History: Changes? No  MENTAL HEALTH EXAM:  There were no vitals taken for this visit.There is no height or weight on file to calculate BMI.  General Appearance: Well Groomed  Eye Contact:  Good  Speech:  Clear and Coherent  Volume:  Normal  Mood:  Euthymic  Affect:  Appropriate  Thought Process:  Goal Directed  Orientation:  Full (Time, Place, and Person)  Thought Content: Logical   Suicidal Thoughts:  No  Homicidal Thoughts:  No  Memory:  Immediate  Judgement:  Good  Insight:  Good  Psychomotor Activity:  Normal  Concentration:  Concentration: Good  Recall:  Good  Fund of Knowledge: Good  Language: Good  Akathisia:  No  AIMS (if indicated): not done  Assets:  Communication Skills  ADL's:  Intact  Cognition: WNL  Prognosis:  Good    DIAGNOSES:    ICD-10-CM   1. Bipolar I disorder (HCC) F31.9   2. Mixed obsessional thoughts and acts F42.2     RECOMMENDATIONS: Continue Vyvanse 10 mg every morning We will change Seroquel to 200 mg 1 nightly, instead of 300 mg half p.o. nightly mostly for ease of use. Continue Paxil 30 mg 1-1/2 p.o. daily.   Continue Lamictal 200 mg 1 p.o. Nightly   continue trazodone 100 mg 1-2 p.o. nightly as needed sleep Return to office in approximately 2 months.    [name], PA-C

## 2018-04-05 DIAGNOSIS — M9903 Segmental and somatic dysfunction of lumbar region: Secondary | ICD-10-CM | POA: Diagnosis not present

## 2018-04-05 DIAGNOSIS — M9905 Segmental and somatic dysfunction of pelvic region: Secondary | ICD-10-CM | POA: Diagnosis not present

## 2018-04-05 DIAGNOSIS — M9914 Subluxation complex (vertebral) of sacral region: Secondary | ICD-10-CM | POA: Diagnosis not present

## 2018-04-05 DIAGNOSIS — M5116 Intervertebral disc disorders with radiculopathy, lumbar region: Secondary | ICD-10-CM | POA: Diagnosis not present

## 2018-04-06 DIAGNOSIS — M9903 Segmental and somatic dysfunction of lumbar region: Secondary | ICD-10-CM | POA: Diagnosis not present

## 2018-04-06 DIAGNOSIS — M9905 Segmental and somatic dysfunction of pelvic region: Secondary | ICD-10-CM | POA: Diagnosis not present

## 2018-04-06 DIAGNOSIS — M9914 Subluxation complex (vertebral) of sacral region: Secondary | ICD-10-CM | POA: Diagnosis not present

## 2018-04-06 DIAGNOSIS — M5116 Intervertebral disc disorders with radiculopathy, lumbar region: Secondary | ICD-10-CM | POA: Diagnosis not present

## 2018-04-08 DIAGNOSIS — M9905 Segmental and somatic dysfunction of pelvic region: Secondary | ICD-10-CM | POA: Diagnosis not present

## 2018-04-08 DIAGNOSIS — M9903 Segmental and somatic dysfunction of lumbar region: Secondary | ICD-10-CM | POA: Diagnosis not present

## 2018-04-08 DIAGNOSIS — M5116 Intervertebral disc disorders with radiculopathy, lumbar region: Secondary | ICD-10-CM | POA: Diagnosis not present

## 2018-04-08 DIAGNOSIS — M9914 Subluxation complex (vertebral) of sacral region: Secondary | ICD-10-CM | POA: Diagnosis not present

## 2018-04-12 DIAGNOSIS — M9903 Segmental and somatic dysfunction of lumbar region: Secondary | ICD-10-CM | POA: Diagnosis not present

## 2018-04-12 DIAGNOSIS — M9914 Subluxation complex (vertebral) of sacral region: Secondary | ICD-10-CM | POA: Diagnosis not present

## 2018-04-12 DIAGNOSIS — Z01419 Encounter for gynecological examination (general) (routine) without abnormal findings: Secondary | ICD-10-CM | POA: Diagnosis not present

## 2018-04-12 DIAGNOSIS — M9905 Segmental and somatic dysfunction of pelvic region: Secondary | ICD-10-CM | POA: Diagnosis not present

## 2018-04-12 DIAGNOSIS — M5116 Intervertebral disc disorders with radiculopathy, lumbar region: Secondary | ICD-10-CM | POA: Diagnosis not present

## 2018-04-12 DIAGNOSIS — Z124 Encounter for screening for malignant neoplasm of cervix: Secondary | ICD-10-CM | POA: Diagnosis not present

## 2018-04-12 DIAGNOSIS — Z6829 Body mass index (BMI) 29.0-29.9, adult: Secondary | ICD-10-CM | POA: Diagnosis not present

## 2018-04-12 DIAGNOSIS — Z1231 Encounter for screening mammogram for malignant neoplasm of breast: Secondary | ICD-10-CM | POA: Diagnosis not present

## 2018-04-13 DIAGNOSIS — M9914 Subluxation complex (vertebral) of sacral region: Secondary | ICD-10-CM | POA: Diagnosis not present

## 2018-04-13 DIAGNOSIS — M5116 Intervertebral disc disorders with radiculopathy, lumbar region: Secondary | ICD-10-CM | POA: Diagnosis not present

## 2018-04-13 DIAGNOSIS — M9905 Segmental and somatic dysfunction of pelvic region: Secondary | ICD-10-CM | POA: Diagnosis not present

## 2018-04-13 DIAGNOSIS — E039 Hypothyroidism, unspecified: Secondary | ICD-10-CM | POA: Diagnosis not present

## 2018-04-13 DIAGNOSIS — M9903 Segmental and somatic dysfunction of lumbar region: Secondary | ICD-10-CM | POA: Diagnosis not present

## 2018-04-15 DIAGNOSIS — M5116 Intervertebral disc disorders with radiculopathy, lumbar region: Secondary | ICD-10-CM | POA: Diagnosis not present

## 2018-04-15 DIAGNOSIS — M9903 Segmental and somatic dysfunction of lumbar region: Secondary | ICD-10-CM | POA: Diagnosis not present

## 2018-04-15 DIAGNOSIS — M9905 Segmental and somatic dysfunction of pelvic region: Secondary | ICD-10-CM | POA: Diagnosis not present

## 2018-04-15 DIAGNOSIS — M9914 Subluxation complex (vertebral) of sacral region: Secondary | ICD-10-CM | POA: Diagnosis not present

## 2018-04-19 DIAGNOSIS — E669 Obesity, unspecified: Secondary | ICD-10-CM | POA: Diagnosis not present

## 2018-04-19 DIAGNOSIS — E049 Nontoxic goiter, unspecified: Secondary | ICD-10-CM | POA: Diagnosis not present

## 2018-04-19 DIAGNOSIS — Z23 Encounter for immunization: Secondary | ICD-10-CM | POA: Diagnosis not present

## 2018-04-19 DIAGNOSIS — E039 Hypothyroidism, unspecified: Secondary | ICD-10-CM | POA: Diagnosis not present

## 2018-04-20 DIAGNOSIS — M5116 Intervertebral disc disorders with radiculopathy, lumbar region: Secondary | ICD-10-CM | POA: Diagnosis not present

## 2018-04-20 DIAGNOSIS — M9903 Segmental and somatic dysfunction of lumbar region: Secondary | ICD-10-CM | POA: Diagnosis not present

## 2018-04-20 DIAGNOSIS — M9914 Subluxation complex (vertebral) of sacral region: Secondary | ICD-10-CM | POA: Diagnosis not present

## 2018-04-20 DIAGNOSIS — M9905 Segmental and somatic dysfunction of pelvic region: Secondary | ICD-10-CM | POA: Diagnosis not present

## 2018-04-21 DIAGNOSIS — F3013 Manic episode, severe, without psychotic symptoms: Secondary | ICD-10-CM | POA: Diagnosis not present

## 2018-04-22 DIAGNOSIS — M9905 Segmental and somatic dysfunction of pelvic region: Secondary | ICD-10-CM | POA: Diagnosis not present

## 2018-04-22 DIAGNOSIS — M5116 Intervertebral disc disorders with radiculopathy, lumbar region: Secondary | ICD-10-CM | POA: Diagnosis not present

## 2018-04-22 DIAGNOSIS — M9914 Subluxation complex (vertebral) of sacral region: Secondary | ICD-10-CM | POA: Diagnosis not present

## 2018-04-22 DIAGNOSIS — M9903 Segmental and somatic dysfunction of lumbar region: Secondary | ICD-10-CM | POA: Diagnosis not present

## 2018-04-26 DIAGNOSIS — M5116 Intervertebral disc disorders with radiculopathy, lumbar region: Secondary | ICD-10-CM | POA: Diagnosis not present

## 2018-04-26 DIAGNOSIS — M9914 Subluxation complex (vertebral) of sacral region: Secondary | ICD-10-CM | POA: Diagnosis not present

## 2018-04-26 DIAGNOSIS — M9903 Segmental and somatic dysfunction of lumbar region: Secondary | ICD-10-CM | POA: Diagnosis not present

## 2018-04-26 DIAGNOSIS — M9905 Segmental and somatic dysfunction of pelvic region: Secondary | ICD-10-CM | POA: Diagnosis not present

## 2018-04-28 ENCOUNTER — Other Ambulatory Visit: Payer: Self-pay | Admitting: Physician Assistant

## 2018-04-28 MED ORDER — PAROXETINE HCL 30 MG PO TABS: 60.0000 mg | ORAL_TABLET | Freq: Every day | ORAL | 1 refills | Status: DC

## 2018-04-28 MED ORDER — LAMOTRIGINE 200 MG PO TABS: 200.0000 mg | ORAL_TABLET | Freq: Every day | ORAL | 1 refills | Status: DC

## 2018-04-30 ENCOUNTER — Other Ambulatory Visit: Payer: Self-pay | Admitting: Physician Assistant

## 2018-04-30 MED ORDER — PAROXETINE HCL 30 MG PO TABS: 60.0000 mg | ORAL_TABLET | Freq: Every day | ORAL | 0 refills | Status: DC

## 2018-04-30 MED ORDER — LAMOTRIGINE 100 MG PO TABS: 100.0000 mg | ORAL_TABLET | Freq: Every day | ORAL | 0 refills | Status: DC

## 2018-05-03 DIAGNOSIS — M2142 Flat foot [pes planus] (acquired), left foot: Secondary | ICD-10-CM | POA: Diagnosis not present

## 2018-05-03 DIAGNOSIS — M5116 Intervertebral disc disorders with radiculopathy, lumbar region: Secondary | ICD-10-CM | POA: Diagnosis not present

## 2018-05-03 DIAGNOSIS — M9903 Segmental and somatic dysfunction of lumbar region: Secondary | ICD-10-CM | POA: Diagnosis not present

## 2018-05-03 DIAGNOSIS — M9906 Segmental and somatic dysfunction of lower extremity: Secondary | ICD-10-CM | POA: Diagnosis not present

## 2018-05-10 DIAGNOSIS — M9906 Segmental and somatic dysfunction of lower extremity: Secondary | ICD-10-CM | POA: Diagnosis not present

## 2018-05-10 DIAGNOSIS — M5116 Intervertebral disc disorders with radiculopathy, lumbar region: Secondary | ICD-10-CM | POA: Diagnosis not present

## 2018-05-10 DIAGNOSIS — M9903 Segmental and somatic dysfunction of lumbar region: Secondary | ICD-10-CM | POA: Diagnosis not present

## 2018-05-10 DIAGNOSIS — M2142 Flat foot [pes planus] (acquired), left foot: Secondary | ICD-10-CM | POA: Diagnosis not present

## 2018-05-11 ENCOUNTER — Other Ambulatory Visit: Payer: Self-pay

## 2018-05-11 MED ORDER — TRAZODONE HCL 100 MG PO TABS: ORAL_TABLET | ORAL | 0 refills | Status: DC

## 2018-05-13 DIAGNOSIS — D229 Melanocytic nevi, unspecified: Secondary | ICD-10-CM | POA: Diagnosis not present

## 2018-05-13 DIAGNOSIS — L814 Other melanin hyperpigmentation: Secondary | ICD-10-CM | POA: Diagnosis not present

## 2018-05-13 DIAGNOSIS — L570 Actinic keratosis: Secondary | ICD-10-CM | POA: Diagnosis not present

## 2018-05-13 DIAGNOSIS — L821 Other seborrheic keratosis: Secondary | ICD-10-CM | POA: Diagnosis not present

## 2018-05-13 DIAGNOSIS — D1801 Hemangioma of skin and subcutaneous tissue: Secondary | ICD-10-CM | POA: Diagnosis not present

## 2018-05-17 DIAGNOSIS — Z0000 Encounter for general adult medical examination without abnormal findings: Secondary | ICD-10-CM | POA: Diagnosis not present

## 2018-05-17 DIAGNOSIS — E785 Hyperlipidemia, unspecified: Secondary | ICD-10-CM | POA: Diagnosis not present

## 2018-05-18 ENCOUNTER — Encounter: Payer: Self-pay | Admitting: Emergency Medicine

## 2018-05-18 DIAGNOSIS — F319 Bipolar disorder, unspecified: Secondary | ICD-10-CM | POA: Insufficient documentation

## 2018-05-18 DIAGNOSIS — F429 Obsessive-compulsive disorder, unspecified: Secondary | ICD-10-CM

## 2018-05-18 DIAGNOSIS — N951 Menopausal and female climacteric states: Secondary | ICD-10-CM | POA: Diagnosis not present

## 2018-05-19 DIAGNOSIS — M5116 Intervertebral disc disorders with radiculopathy, lumbar region: Secondary | ICD-10-CM | POA: Diagnosis not present

## 2018-05-19 DIAGNOSIS — M9906 Segmental and somatic dysfunction of lower extremity: Secondary | ICD-10-CM | POA: Diagnosis not present

## 2018-05-19 DIAGNOSIS — M9903 Segmental and somatic dysfunction of lumbar region: Secondary | ICD-10-CM | POA: Diagnosis not present

## 2018-05-19 DIAGNOSIS — M2142 Flat foot [pes planus] (acquired), left foot: Secondary | ICD-10-CM | POA: Diagnosis not present

## 2018-06-02 ENCOUNTER — Encounter: Payer: Self-pay | Admitting: Physician Assistant

## 2018-06-02 ENCOUNTER — Ambulatory Visit: Payer: BLUE CROSS/BLUE SHIELD | Admitting: Physician Assistant

## 2018-06-02 DIAGNOSIS — F429 Obsessive-compulsive disorder, unspecified: Secondary | ICD-10-CM

## 2018-06-02 DIAGNOSIS — F319 Bipolar disorder, unspecified: Secondary | ICD-10-CM | POA: Diagnosis not present

## 2018-06-02 MED ORDER — LAMOTRIGINE 200 MG PO TABS: 200.0000 mg | ORAL_TABLET | Freq: Every day | ORAL | 1 refills | Status: DC

## 2018-06-02 MED ORDER — PAROXETINE HCL 30 MG PO TABS: 45.0000 mg | ORAL_TABLET | Freq: Every day | ORAL | 1 refills | Status: DC

## 2018-06-02 MED ORDER — LISDEXAMFETAMINE DIMESYLATE 10 MG PO CAPS: 10.0000 mg | ORAL_CAPSULE | Freq: Every day | ORAL | 0 refills | Status: DC

## 2018-06-02 MED ORDER — QUETIAPINE FUMARATE 200 MG PO TABS: 200.0000 mg | ORAL_TABLET | Freq: Every day | ORAL | 1 refills | Status: DC

## 2018-06-02 MED ORDER — TRAZODONE HCL 100 MG PO TABS: ORAL_TABLET | ORAL | 1 refills | Status: DC

## 2018-06-02 NOTE — Progress Notes (Signed)
[facility]  Patient ID: Lindsay [name],  MRN: [id number]  PCP: [name], MD (Inactive)  Date of Evaluation: 06/03/2018 Time spent:15 minutes  Chief Complaint:  Chief Complaint    Follow-up      HISTORY/CURRENT STATUS: HPI here for routine med check.  Cimone states she is doing well overall.  She is exercising on a daily and has lost a little bit of weight.  The main reason she does not does because it makes her feel better.  She likes being out in the sun taking walks.  She states she sleeps pretty well.    Patient denies loss of interest in usual activities and is able to enjoy things.  Denies decreased energy or motivation.  Appetite has not changed.  No extreme sadness, tearfulness, or feelings of hopelessness.  Denies any changes in concentration, making decisions or remembering things.  Denies suicidal or homicidal thoughts.  Patient denies increased energy with decreased need for sleep, no increased talkativeness, no racing thoughts, no impulsivity or risky behaviors, no increased spending, no increased libido, no grandiosity.  States that attention is good without easy distractibility.  Able to focus on things and finish tasks to completion.   Reports that the OCD is mostly well controlled.  She does not have uncontrollable compulsive behaviors.  Sometimes she will have thoughts that all over and over in her mind but she is able to deal with it.  Individual Medical History/ Review of Systems: Changes? :No    Past medications for mental health diagnoses include: Geodon, Lamictal, Latuda, Paxil, trazodone, Ambien, Seroquel, Vraylar, Risperdal, Saphris, Depakote, Fanapt, Abilify, Rexulti, Wellbutrin, Provigil, Nuvigil  Allergies: Patient has no known allergies.  Current Medications:  Current Outpatient Medications:  .  Estradiol (ELESTRIN) 0.52 MG/0.87 GM (0.06%) GEL, Apply 1-1.5 application topically daily. Apply 1 application every other night, and on  opposite nights apply 1.5 applications., Disp: , Rfl:  .  lamoTRIgine (LAMICTAL) 200 MG tablet, Take 1 tablet (200 mg total) by mouth daily., Disp: 90 tablet, Rfl: 1 .  levothyroxine (SYNTHROID, LEVOTHROID) 150 MCG tablet, Take 100 mcg by mouth daily before breakfast. , Disp: , Rfl:  .  liothyronine (CYTOMEL) 5 MCG tablet, TK 1 T PO QD OES, Disp: , Rfl:  .  lisdexamfetamine (VYVANSE) 10 MG capsule, Take 1 capsule (10 mg total) by mouth daily., Disp: 30 capsule, Rfl: 0 .  lisdexamfetamine (VYVANSE) 10 MG capsule, Take 1 capsule (10 mg total) by mouth daily., Disp: 30 capsule, Rfl: 0 .  Magnesium Oxide 250 MG TABS, Take 750 tablets by mouth daily., Disp: , Rfl:  .  PARoxetine (PAXIL) 30 MG tablet, Take 1.5 tablets (45 mg total) by mouth daily., Disp: 135 tablet, Rfl: 1 .  progesterone (PROMETRIUM) 200 MG capsule, Take 200 mg by mouth daily. Reported on 07/11/2015, Disp: , Rfl:  .  QUEtiapine (SEROQUEL) 200 MG tablet, Take 1 tablet (200 mg total) by mouth at bedtime., Disp: 90 tablet, Rfl: 1 .  simvastatin (ZOCOR) 20 MG tablet, Take 20 mg by mouth daily., Disp: , Rfl:  .  traZODone (DESYREL) 100 MG tablet, Take 1-2 tablets as needed at bedtime for sleep, Disp: 180 tablet, Rfl: 1 .  ibuprofen (ADVIL,MOTRIN) 600 MG tablet, Take 1 tablet (600 mg total) by mouth every 6 (six) hours as needed (mild pain). (Patient not taking: Reported on 07/11/2015), Disp: 30 tablet, Rfl: 0 .  lisdexamfetamine (VYVANSE) 10 MG capsule, Take 1 capsule (10 mg total) by mouth daily., Disp: 30  capsule, Rfl: 0 Medication Side Effects: none  Family Medical/ Social History: Changes? No  MENTAL HEALTH EXAM:  There were no vitals taken for this visit.There is no height or weight on file to calculate BMI.  General Appearance: Casual  Eye Contact:  Good  Speech:  Clear and Coherent  Volume:  Normal  Mood:  Euthymic  Affect:  Appropriate  Thought Process:  Goal Directed  Orientation:  Full (Time, Place, and Person)  Thought  Content: Logical   Suicidal Thoughts:  No  Homicidal Thoughts:  No  Memory:  WNL  Judgement:  Good  Insight:  Good  Psychomotor Activity:  Normal  Concentration:  Concentration: Good  Recall:  Good  Fund of Knowledge: Good  Language: Good  Assets:  Desire for Improvement  ADL's:  Intact  Cognition: WNL  Prognosis:  Good    DIAGNOSES:    ICD-10-CM   1. Bipolar I disorder (HCC) F31.9   2. Obsessive-compulsive disorder, unspecified type F42.9     Receiving Psychotherapy: No    RECOMMENDATIONS: Continue current medications. Continue exercise daily if possible.  Have recommended she use the mood lamp in January and February especially, when she knows the depression usually gets worse. Return in 6 weeks or sooner as needed.   Melony Overlyeresa Jaidee Stipe, PA-C

## 2018-07-01 DIAGNOSIS — M7672 Peroneal tendinitis, left leg: Secondary | ICD-10-CM | POA: Diagnosis not present

## 2018-07-01 DIAGNOSIS — F3013 Manic episode, severe, without psychotic symptoms: Secondary | ICD-10-CM | POA: Diagnosis not present

## 2018-07-09 ENCOUNTER — Telehealth: Payer: Self-pay | Admitting: Physician Assistant

## 2018-07-09 DIAGNOSIS — F3013 Manic episode, severe, without psychotic symptoms: Secondary | ICD-10-CM | POA: Diagnosis not present

## 2018-07-09 NOTE — Telephone Encounter (Signed)
Need specific pharmacy 

## 2018-07-09 NOTE — Telephone Encounter (Signed)
Pt. Called and said that she needs a refill on her vyvanse. Her next appt is Jan 15th. Please escribe it to for her.

## 2018-07-12 ENCOUNTER — Other Ambulatory Visit: Payer: Self-pay | Admitting: Physician Assistant

## 2018-07-12 MED ORDER — LISDEXAMFETAMINE DIMESYLATE 10 MG PO CAPS: 10.0000 mg | ORAL_CAPSULE | Freq: Every day | ORAL | 0 refills | Status: DC

## 2018-07-12 NOTE — Telephone Encounter (Signed)
Pt needs it to go to [facility] on [address]

## 2018-07-14 ENCOUNTER — Ambulatory Visit: Payer: BLUE CROSS/BLUE SHIELD | Admitting: Physician Assistant

## 2018-07-14 ENCOUNTER — Encounter: Payer: Self-pay | Admitting: Physician Assistant

## 2018-07-14 VITALS — BP 129/76 | HR 59

## 2018-07-14 DIAGNOSIS — F900 Attention-deficit hyperactivity disorder, predominantly inattentive type: Secondary | ICD-10-CM | POA: Diagnosis not present

## 2018-07-14 DIAGNOSIS — F319 Bipolar disorder, unspecified: Secondary | ICD-10-CM | POA: Diagnosis not present

## 2018-07-14 DIAGNOSIS — F429 Obsessive-compulsive disorder, unspecified: Secondary | ICD-10-CM | POA: Diagnosis not present

## 2018-07-14 MED ORDER — LISDEXAMFETAMINE DIMESYLATE 10 MG PO CAPS: 10.0000 mg | ORAL_CAPSULE | Freq: Every day | ORAL | 0 refills | Status: DC

## 2018-07-14 NOTE — Progress Notes (Signed)
[facility]  Patient ID: Lindsay [name] A Loescher,  MRN: [id number]  PCP: Hoyt KochYousef, Deema, MD (Inactive)  Date of Evaluation: 07/14/2018 Time spent:15 minutes  Chief Complaint:  Chief Complaint    Follow-up      HISTORY/CURRENT STATUS: HPI Here for routine med check.  Doing well.  Patient denies loss of interest in usual activities and is able to enjoy things.  Denies decreased energy or motivation.  Appetite has not changed.  No extreme sadness, tearfulness, or feelings of hopelessness.  Denies any changes in concentration, making decisions or remembering things.  Denies suicidal or homicidal thoughts.   Patient denies increased energy with decreased need for sleep, no increased talkativeness, no racing thoughts, no impulsivity or risky behaviors, no increased spending, no increased libido, no grandiosity.  The Vyvanse has helped tremendously. Able to do things, exercise, 'and live a normal life.'   Individual Medical History/ Review of Systems: Changes? :No    Past medications for mental health diagnoses include: Geodon, Lamictal, Latuda, Paxil, trazodone, Ambien, Seroquel, Vraylar, Risperdal, Saphris, Depakote, Fanapt, Abilify, Rexulti, Wellbutrin, Provigil, Nuvigil  Allergies: Patient has no known allergies.  Current Medications:  Current Outpatient Medications:  .  Estradiol (ELESTRIN) 0.52 MG/0.87 GM (0.06%) GEL, Apply 1-1.5 application topically daily. Apply 1 application every other night, and on opposite nights apply 1.5 applications., Disp: , Rfl:  .  ibuprofen (ADVIL,MOTRIN) 600 MG tablet, Take 1 tablet (600 mg total) by mouth every 6 (six) hours as needed (mild pain)., Disp: 30 tablet, Rfl: 0 .  lamoTRIgine (LAMICTAL) 200 MG tablet, Take 1 tablet (200 mg total) by mouth daily., Disp: 90 tablet, Rfl: 1 .  levothyroxine (SYNTHROID, LEVOTHROID) 150 MCG tablet, Take 100 mcg by mouth daily before breakfast. , Disp: , Rfl:  .  liothyronine (CYTOMEL) 5 MCG tablet, TK 1 T PO  QD OES, Disp: , Rfl:  .  lisdexamfetamine (VYVANSE) 10 MG capsule, Take 1 capsule (10 mg total) by mouth daily., Disp: 30 capsule, Rfl: 0 .  Magnesium Oxide 250 MG TABS, Take 750 tablets by mouth daily., Disp: , Rfl:  .  PARoxetine (PAXIL) 30 MG tablet, Take 1.5 tablets (45 mg total) by mouth daily., Disp: 135 tablet, Rfl: 1 .  progesterone (PROMETRIUM) 200 MG capsule, Take 200 mg by mouth daily. Reported on 07/11/2015, Disp: , Rfl:  .  QUEtiapine (SEROQUEL) 200 MG tablet, Take 1 tablet (200 mg total) by mouth at bedtime., Disp: 90 tablet, Rfl: 1 .  simvastatin (ZOCOR) 20 MG tablet, Take 20 mg by mouth daily., Disp: , Rfl:  .  lisdexamfetamine (VYVANSE) 10 MG capsule, Take 1 capsule (10 mg total) by mouth daily., Disp: 30 capsule, Rfl: 0 .  lisdexamfetamine (VYVANSE) 10 MG capsule, Take 1 capsule (10 mg total) by mouth daily., Disp: 30 capsule, Rfl: 0 .  lisdexamfetamine (VYVANSE) 10 MG capsule, Take 1 capsule (10 mg total) by mouth daily., Disp: 30 capsule, Rfl: 0 .  traZODone (DESYREL) 100 MG tablet, Take 1-2 tablets as needed at bedtime for sleep, Disp: 180 tablet, Rfl: 1 Medication Side Effects: Lindsay [name]  Family Medical/ Social History: Changes? No  MENTAL HEALTH EXAM:  Blood pressure 129/76, pulse (!) 59.There is no height or weight on file to calculate BMI.  General Appearance: Casual and Well Groomed  Eye Contact:  Good  Speech:  Clear and Coherent  Volume:  Normal  Mood:  Euthymic  Affect:  Appropriate  Thought Process:  Goal Directed  Orientation:  Full (Time, Place, and Person)  Thought Content: Logical   Suicidal Thoughts:  No  Homicidal Thoughts:  No  Memory:  WNL  Judgement:  NA  Insight:  Good  Psychomotor Activity:  Normal  Concentration:  Concentration: Good  Recall:  Good  Fund of Knowledge: Good  Language: Good  Assets:  Desire for Improvement  ADL's:  Intact  Cognition: WNL  Prognosis:  Good    DIAGNOSES:    ICD-10-CM   1. Bipolar I disorder (HCC) F31.9    2. Obsessive-compulsive disorder, unspecified type F42.9   3. Attention deficit hyperactivity disorder (ADHD), predominantly inattentive type F90.0     Receiving Psychotherapy: No    RECOMMENDATIONS: I am glad to see her doing so well! Continue Lamictal 200 mg p.o. daily. Continue Vyvanse 10 mg p.o. every morning. Continue Paxil 45 mg p.o. daily. Continue Seroquel 200 mg nightly. Continue trazodone 50 to 100 mg nightly as needed Recommended again that she use the therapy lamp especially at this winter on gray days. Return in 3 months or sooner as needed.   Melony Overlyeresa , PA-C

## 2018-07-14 NOTE — Patient Instructions (Signed)
Get NAC at [facility], [address], [address]

## 2018-07-23 DIAGNOSIS — F3013 Manic episode, severe, without psychotic symptoms: Secondary | ICD-10-CM | POA: Diagnosis not present

## 2018-09-08 DIAGNOSIS — M25521 Pain in right elbow: Secondary | ICD-10-CM | POA: Diagnosis not present

## 2018-09-08 DIAGNOSIS — M25552 Pain in left hip: Secondary | ICD-10-CM | POA: Diagnosis not present

## 2018-09-13 DIAGNOSIS — M7711 Lateral epicondylitis, right elbow: Secondary | ICD-10-CM | POA: Diagnosis not present

## 2018-09-13 DIAGNOSIS — M25621 Stiffness of right elbow, not elsewhere classified: Secondary | ICD-10-CM | POA: Diagnosis not present

## 2018-09-16 DIAGNOSIS — M25621 Stiffness of right elbow, not elsewhere classified: Secondary | ICD-10-CM | POA: Diagnosis not present

## 2018-09-16 DIAGNOSIS — M7711 Lateral epicondylitis, right elbow: Secondary | ICD-10-CM | POA: Diagnosis not present

## 2018-10-05 DIAGNOSIS — E039 Hypothyroidism, unspecified: Secondary | ICD-10-CM | POA: Diagnosis not present

## 2018-10-12 DIAGNOSIS — R0602 Shortness of breath: Secondary | ICD-10-CM | POA: Diagnosis not present

## 2018-10-13 ENCOUNTER — Encounter: Payer: Self-pay | Admitting: Physician Assistant

## 2018-10-13 ENCOUNTER — Other Ambulatory Visit: Payer: Self-pay

## 2018-10-13 ENCOUNTER — Ambulatory Visit (INDEPENDENT_AMBULATORY_CARE_PROVIDER_SITE_OTHER): Payer: BLUE CROSS/BLUE SHIELD | Admitting: Physician Assistant

## 2018-10-13 DIAGNOSIS — G4700 Insomnia, unspecified: Secondary | ICD-10-CM | POA: Diagnosis not present

## 2018-10-13 DIAGNOSIS — F900 Attention-deficit hyperactivity disorder, predominantly inattentive type: Secondary | ICD-10-CM | POA: Diagnosis not present

## 2018-10-13 DIAGNOSIS — F319 Bipolar disorder, unspecified: Secondary | ICD-10-CM

## 2018-10-13 DIAGNOSIS — F422 Mixed obsessional thoughts and acts: Secondary | ICD-10-CM

## 2018-10-13 MED ORDER — LAMOTRIGINE 200 MG PO TABS: 200.0000 mg | ORAL_TABLET | Freq: Every day | ORAL | 1 refills | Status: DC

## 2018-10-13 MED ORDER — QUETIAPINE FUMARATE 200 MG PO TABS: 200.0000 mg | ORAL_TABLET | Freq: Every day | ORAL | 1 refills | Status: DC

## 2018-10-13 MED ORDER — LISDEXAMFETAMINE DIMESYLATE 10 MG PO CAPS: 10.0000 mg | ORAL_CAPSULE | Freq: Every day | ORAL | 0 refills | Status: DC

## 2018-10-13 MED ORDER — PAROXETINE HCL 30 MG PO TABS: 45.0000 mg | ORAL_TABLET | Freq: Every day | ORAL | 1 refills | Status: DC

## 2018-10-13 MED ORDER — TRAZODONE HCL 100 MG PO TABS: ORAL_TABLET | ORAL | 1 refills | Status: DC

## 2018-10-13 NOTE — Progress Notes (Signed)
[facility]  Patient ID: Lindsay [name],  MRN: [id number]  PCP: [name], MD (Inactive)  Date of Evaluation: 10/13/2018 Time spent:15 minutes  Chief Complaint:  Chief Complaint    Follow-up     Virtual Visit via Telephone Note  I connected with Satira Mccallum on 10/13/18 at  3:00 PM EDT by telephone and verified that I am speaking with the correct person using two identifiers.   I discussed the limitations, risks, security and privacy concerns of performing an evaluation and management service by telephone and the availability of in person appointments. I also discussed with the patient that there may be a patient responsible charge related to this service. The patient expressed understanding and agreed to proceed.    HISTORY/CURRENT STATUS: HPI For 3 month med check  Doing great!  States she feels good and made it through the winter without having any major depressive symptoms.  Denies anhedonia, decreased energy or motivation, she is not isolating any more than is required because of the coronavirus pandemic and she is not crying easily.  She has felt a lot better since being on the Vyvanse.  It has given her more energy and focus to do the things that she wants to do to completion.  States that even with the coronavirus, she has not "gone overboard" with cleaning everything.  States she likes everything to be neat and orderly and clean but not to the point where she is scrubbing things or letting it get to her if things are not perfect.  Patient denies increased energy with decreased need for sleep, no increased talkativeness, no racing thoughts, no impulsivity or risky behaviors, no increased spending, no increased libido, no grandiosity.  Denies muscle or joint pain, stiffness, or dystonia.  Denies dizziness, syncope, seizures, numbness, tingling, tremor, tics, unsteady gait, slurred speech, confusion.   Individual Medical History/ Review of Systems: Changes?  :Yes hurt her hip. Unable to walk like she did b/c it hurts.   Past medications for mental health diagnoses include: Geodon, Lamictal, Latuda, Paxil, trazodone, Ambien, Seroquel, Vraylar, Risperdal, Saphris, Depakote, Fanapt, Abilify, Rexulti, Wellbutrin, Provigil, Nuvigil  Allergies: Patient has no known allergies.  Current Medications:  Current Outpatient Medications:  .  Estradiol (ELESTRIN) 0.52 MG/0.87 GM (0.06%) GEL, Apply 1-1.5 application topically daily. Apply 1 application every other night, and on opposite nights apply 1.5 applications., Disp: , Rfl:  .  ibuprofen (ADVIL,MOTRIN) 600 MG tablet, Take 1 tablet (600 mg total) by mouth every 6 (six) hours as needed (mild pain)., Disp: 30 tablet, Rfl: 0 .  lamoTRIgine (LAMICTAL) 200 MG tablet, Take 1 tablet (200 mg total) by mouth daily., Disp: 90 tablet, Rfl: 1 .  liothyronine (CYTOMEL) 5 MCG tablet, TK 1 T PO QD OES, Disp: , Rfl:  .  lisdexamfetamine (VYVANSE) 10 MG capsule, Take 1 capsule (10 mg total) by mouth daily., Disp: 30 capsule, Rfl: 0 .  Magnesium Oxide 250 MG TABS, Take 750 tablets by mouth daily., Disp: , Rfl:  .  PARoxetine (PAXIL) 30 MG tablet, Take 1.5 tablets (45 mg total) by mouth daily., Disp: 135 tablet, Rfl: 1 .  progesterone (PROMETRIUM) 200 MG capsule, Take 200 mg by mouth daily. Reported on 07/11/2015, Disp: , Rfl:  .  QUEtiapine (SEROQUEL) 200 MG tablet, Take 1 tablet (200 mg total) by mouth at bedtime., Disp: 90 tablet, Rfl: 1 .  simvastatin (ZOCOR) 20 MG tablet, Take 20 mg by mouth daily., Disp: , Rfl:  .  SYNTHROID 112  MCG tablet, TK 1 T PO QAM OES, Disp: , Rfl:  .  traZODone (DESYREL) 100 MG tablet, Take 1-2 tablets as needed at bedtime for sleep, Disp: 180 tablet, Rfl: 1 .  levothyroxine (SYNTHROID, LEVOTHROID) 150 MCG tablet, Take 100 mcg by mouth daily before breakfast. , Disp: , Rfl:  .  [START ON 11/09/2018] lisdexamfetamine (VYVANSE) 10 MG capsule, Take 1 capsule (10 mg total) by mouth daily., Disp: 30  capsule, Rfl: 0 .  [START ON 12/09/2018] lisdexamfetamine (VYVANSE) 10 MG capsule, Take 1 capsule (10 mg total) by mouth daily., Disp: 30 capsule, Rfl: 0 .  [START ON 01/07/2019] lisdexamfetamine (VYVANSE) 10 MG capsule, Take 1 capsule (10 mg total) by mouth daily., Disp: 30 capsule, Rfl: 0 Medication Side Effects: none  Family Medical/ Social History: Changes? Yes shelter in place because of coronavirus.  MENTAL HEALTH EXAM:  There were no vitals taken for this visit.There is no height or weight on file to calculate BMI.  General Appearance: Phone visit, unable to assess  Eye Contact:  Unable to assess  Speech:  Clear and Coherent  Volume:  Normal  Mood:  Euthymic  Affect:  Unable to assess  Thought Process:  Goal Directed  Orientation:  Full (Time, Place, and Person)  Thought Content: Logical   Suicidal Thoughts:  No  Homicidal Thoughts:  No  Memory:  WNL  Judgement:  Good  Insight:  Good  Psychomotor Activity:  Unable to assess  Concentration:  Concentration: Good and Attention Span: Good  Recall:  Good  Fund of Knowledge: Good  Language: Good  Assets:  Desire for Improvement  ADL's:  Intact  Cognition: WNL  Prognosis:  Good    DIAGNOSES:    ICD-10-CM   1. Bipolar I disorder (HCC) F31.9   2. Attention deficit hyperactivity disorder (ADHD), predominantly inattentive type F90.0   3. Mixed obsessional thoughts and acts F42.2   4. Insomnia, unspecified type G47.00     Receiving Psychotherapy: No    RECOMMENDATIONS:  Continue Lamictal 200 mg p.o. daily. Continue Vyvanse 10 mg every morning. Continue Paxil 45 mg p.o. daily. Continue Seroquel 200 mg nightly. Continue trazodone 100 mg 1-2 nightly as needed sleep. I am glad to see her doing so well! Return in 3 months.  Melony Overlyeresa Colon Rueth, PA-C   This record has been created using [employer] software.  Chart creation errors have been sought, but may not always have been located and corrected. Such creation errors do not reflect  on the standard of medical care.

## 2018-11-17 DIAGNOSIS — E669 Obesity, unspecified: Secondary | ICD-10-CM | POA: Diagnosis not present

## 2018-11-17 DIAGNOSIS — E049 Nontoxic goiter, unspecified: Secondary | ICD-10-CM | POA: Diagnosis not present

## 2018-11-17 DIAGNOSIS — E039 Hypothyroidism, unspecified: Secondary | ICD-10-CM | POA: Diagnosis not present

## 2018-11-25 DIAGNOSIS — R0602 Shortness of breath: Secondary | ICD-10-CM | POA: Diagnosis not present

## 2018-11-25 DIAGNOSIS — R0683 Snoring: Secondary | ICD-10-CM | POA: Diagnosis not present

## 2018-11-25 DIAGNOSIS — R5383 Other fatigue: Secondary | ICD-10-CM | POA: Diagnosis not present

## 2018-12-01 DIAGNOSIS — M6281 Muscle weakness (generalized): Secondary | ICD-10-CM | POA: Diagnosis not present

## 2018-12-01 DIAGNOSIS — S93402D Sprain of unspecified ligament of left ankle, subsequent encounter: Secondary | ICD-10-CM | POA: Diagnosis not present

## 2018-12-01 DIAGNOSIS — M79672 Pain in left foot: Secondary | ICD-10-CM | POA: Diagnosis not present

## 2018-12-01 DIAGNOSIS — M62472 Contracture of muscle, left ankle and foot: Secondary | ICD-10-CM | POA: Diagnosis not present

## 2018-12-02 DIAGNOSIS — R0602 Shortness of breath: Secondary | ICD-10-CM | POA: Diagnosis not present

## 2018-12-08 DIAGNOSIS — R0602 Shortness of breath: Secondary | ICD-10-CM | POA: Diagnosis not present

## 2018-12-08 DIAGNOSIS — M79672 Pain in left foot: Secondary | ICD-10-CM | POA: Diagnosis not present

## 2018-12-08 DIAGNOSIS — M62472 Contracture of muscle, left ankle and foot: Secondary | ICD-10-CM | POA: Diagnosis not present

## 2018-12-09 DIAGNOSIS — M6281 Muscle weakness (generalized): Secondary | ICD-10-CM | POA: Diagnosis not present

## 2018-12-09 DIAGNOSIS — M76812 Anterior tibial syndrome, left leg: Secondary | ICD-10-CM | POA: Diagnosis not present

## 2018-12-09 DIAGNOSIS — S93402D Sprain of unspecified ligament of left ankle, subsequent encounter: Secondary | ICD-10-CM | POA: Diagnosis not present

## 2018-12-13 DIAGNOSIS — S93402D Sprain of unspecified ligament of left ankle, subsequent encounter: Secondary | ICD-10-CM | POA: Diagnosis not present

## 2018-12-13 DIAGNOSIS — M6281 Muscle weakness (generalized): Secondary | ICD-10-CM | POA: Diagnosis not present

## 2018-12-21 ENCOUNTER — Ambulatory Visit (INDEPENDENT_AMBULATORY_CARE_PROVIDER_SITE_OTHER): Payer: BC Managed Care – PPO

## 2018-12-21 ENCOUNTER — Ambulatory Visit (INDEPENDENT_AMBULATORY_CARE_PROVIDER_SITE_OTHER): Payer: BC Managed Care – PPO | Admitting: Internal Medicine

## 2018-12-21 ENCOUNTER — Other Ambulatory Visit: Payer: Self-pay

## 2018-12-21 ENCOUNTER — Encounter: Payer: Self-pay | Admitting: Internal Medicine

## 2018-12-21 DIAGNOSIS — E039 Hypothyroidism, unspecified: Secondary | ICD-10-CM | POA: Diagnosis not present

## 2018-12-21 DIAGNOSIS — R0600 Dyspnea, unspecified: Secondary | ICD-10-CM | POA: Insufficient documentation

## 2018-12-21 DIAGNOSIS — R0609 Other forms of dyspnea: Secondary | ICD-10-CM | POA: Diagnosis not present

## 2018-12-21 DIAGNOSIS — R05 Cough: Secondary | ICD-10-CM | POA: Diagnosis not present

## 2018-12-21 LAB — D-DIMER, QUANTITATIVE: D-Dimer, Quant: 0.19 mcg/mL FEU (ref <0.50)

## 2018-12-21 NOTE — Progress Notes (Addendum)
Lindsay [name], female    DOB: 04-23-1961,    MRN: [id number]   Brief patient profile:  [age] yowf never smoker never respiratory  problems in childhood or adulthood and IUP with baseline wt around 145 and stayed active including stairmaster aerobics then around 2010 noted doe x hills  s w/u then after 1st of Jan 2020 noted onset sob at rest worse if hungry after eating sometimes too and on progresterone 200 mg and stopped it 1st of June 2020 referred to pulmonary clinic 12/21/2018 by Lindsay Harmanorothy Scifes, PA     History of Present Illness  12/21/2018  Pulmonary/ 1st office eval/Lindsay [name]  Chief Complaint  Patient presents with   Pulmonary Consult    Referred by Lindsay Harmanorothy Scifes, PA. Pt c/o DOE x 6 months.   Dyspnea:  Walk/jog x up to hour but stops twice- three times due to sob when jogging  Yet also aware of sob at rest intermittently but better since off progesterone 200 mg daily  Cough: none Sleep: one pillow SABA use: none  No obvious day to day or daytime variability or assoc excess/ purulent sputum or mucus plugs or hemoptysis or cp or chest tightness, subjective wheeze or overt sinus or hb symptoms.   Sleeping  without nocturnal  or early am exacerbation  of respiratory  c/o's or need for noct saba. Also denies any obvious fluctuation of symptoms with weather or environmental changes or other aggravating or alleviating factors except as outlined above   No unusual exposure hx or h/o childhood pna/ asthma or knowledge of premature birth.  Current Allergies, Complete Past Medical History, Past Surgical History, Family History, and Social History were reviewed in [facility] electronic medical record.  ROS  The following are not active complaints unless bolded Hoarseness, sore throat, dysphagia, dental problems, itching, sneezing,  nasal congestion or discharge of excess mucus or purulent secretions, ear ache,   fever, chills, sweats, unintended wt loss or wt gain, classically pleuritic or  exertional cp,  orthopnea pnd or arm/hand swelling  or leg swelling, presyncope, palpitations, abdominal pain, anorexia, nausea, vomiting, diarrhea  or change in bowel habits or change in bladder habits, change in stools or change in urine, dysuria, hematuria,  rash, arthralgias, visual complaints, headache, numbness, weakness or ataxia or problems with walking or coordination,  change in mood or  memory.           Past Medical History:  Diagnosis Date   Anxiety    Bipolar disorder (HCC)    Depression    OCD   Hypothyroidism     Outpatient Medications Prior to Visit  Medication Sig Dispense Refill   Estradiol (ELESTRIN) 0.52 MG/0.87 GM (0.06%) GEL Apply 1-1.5 application topically daily. Apply 1 application every other night, and on opposite nights apply 1.5 applications.     ibuprofen (ADVIL,MOTRIN) 600 MG tablet Take 1 tablet (600 mg total) by mouth every 6 (six) hours as needed (mild pain). 30 tablet 0   lamoTRIgine (LAMICTAL) 200 MG tablet Take 1 tablet (200 mg total) by mouth daily. 90 tablet 1   levothyroxine (SYNTHROID, LEVOTHROID) 150 MCG tablet Take 100 mcg by mouth daily before breakfast.      liothyronine (CYTOMEL) 5 MCG tablet TK 1 T PO QD OES     lisdexamfetamine (VYVANSE) 10 MG capsule Take 1 capsule (10 mg total) by mouth daily. 30 capsule 0   lisdexamfetamine (VYVANSE) 10 MG capsule Take 1 capsule (10 mg total) by mouth daily. 30 capsule 0  lisdexamfetamine (VYVANSE) 10 MG capsule Take 1 capsule (10 mg total) by mouth daily. 30 capsule 0   [START ON 01/07/2019] lisdexamfetamine (VYVANSE) 10 MG capsule Take 1 capsule (10 mg total) by mouth daily. 30 capsule 0   Magnesium Oxide 250 MG TABS Take 750 tablets by mouth daily.     PARoxetine (PAXIL) 30 MG tablet Take 1.5 tablets (45 mg total) by mouth daily. 135 tablet 1          QUEtiapine (SEROQUEL) 200 MG tablet Take 1 tablet (200 mg total) by mouth at bedtime. 90 tablet 1   simvastatin (ZOCOR) 20 MG tablet  Take 20 mg by mouth daily.     SYNTHROID 112 MCG tablet TK 1 T PO QAM OES     traZODone (DESYREL) 100 MG tablet Take 1-2 tablets as needed at bedtime for sleep 180 tablet 1      Objective:     BP 122/80 (BP Location: Left Arm, Cuff Size: Normal)    Pulse 67    Temp 98 F (36.7 C) (Oral)    Ht 5\' 6"  (1.676 m)    Wt 171 lb 6.4 oz (77.7 kg)    SpO2 97%    BMI 27.66 kg/m   SpO2: 97 %  RA  HEENT: nl dentition, turbinates bilaterally, and oropharynx. Nl external ear canals without cough reflex   NECK :  without JVD/Nodes/TM/ nl carotid upstrokes bilaterally   LUNGS: no acc muscle use,  Nl contour chest which is clear to A and P bilaterally without cough on insp or exp maneuvers   CV:  RRR  no s3 or murmur or increase in P2, and no edema   ABD:  soft and nontender with nl inspiratory excursion in the supine position. No bruits or organomegaly appreciated, bowel sounds nl  MS:  Nl gait/ ext warm without deformities, calf tenderness, cyanosis or clubbing No obvious joint restrictions   SKIN: warm and dry without lesions    NEURO:  alert, approp, nl sensorium with  no motor or cerebellar deficits apparent.    CXR PA and Lateral:   12/21/2018 :    I personally reviewed images and agree with radiology impression as follows:   No acute cardiopulmonary process.   Labs ordered/ reviewed:     Chemistry      Component Value Date/Time   NA 135 12/21/2018 1659   K 3.9 12/21/2018 1659   CL 100 12/21/2018 1659   CO2 25 12/21/2018 1659   BUN 31 (H) 12/21/2018 1659   CREATININE 0.93 12/21/2018 1659      Component Value Date/Time   CALCIUM 9.6 12/21/2018 1659        Lab Results  Component Value Date   WBC 6.6 12/21/2018   HGB 14.1 12/21/2018   HCT 41.6 12/21/2018   MCV 94.1 12/21/2018   PLT 251.0 12/21/2018       EOS                                                              0.1                                     12/21/2018   Lab  Results  Component Value Date   DDIMER  0.19 12/21/2018      Lab Results  Component Value Date   TSH 27.69 (H) 12/21/2018     Lab Results  Component Value Date   PROBNP 10.0 12/21/2018                 Assessment   DOE (dyspnea on exertion) Onset 2010, with sob at rest improved off progesterone - 12/21/2018   Walked RA  2 laps @  approx 24250ft each @ fast pace  stopped due to  End of study, min sob, sats still 98%    Symptoms are markedly disproportionate to objective findings and not clear to what extent this is actually a pulmonary  problem but pt does appear to have difficult to sort out respiratory symptoms of unknown origin for which  DDX  = almost all start with A and  include Adherence, Ace Inhibitors, Acid Reflux, Active Sinus Disease, Alpha 1 Antitripsin deficiency, Anxiety masquerading as Airways dz,  ABPA,  Allergy(esp in young), Aspiration (esp in elderly), Adverse effects of meds,  Active smoking or Vaping, A bunch of PE's/clot burden (a few small clots can't cause this syndrome unless there is already severe underlying pulm or vascular dz with poor reserve),  Anemia or thyroid disorder, plus two Bs  = Bronchiectasis and Beta blocker use..and one C= CHF     Adherence is always the initial "prime suspect" and is a multilayered concern that requires a "trust but verify" approach in every patient - starting with knowing how to use medications, especially inhalers, correctly, keeping up with refills and understanding the fundamental difference between maintenance and prns vs those medications only taken for a very short course and then stopped and not refilled.   ? Adverse drug effects > better off progesterone in high doses which both increases resp drive and risks worsening gerd   ? Anxiety/depression/ deconditioning  > usually at the bottom of this list of usual suspects but should be much higher on this pt's based on H and P and note already on psychotropics and may interfere with adherence and also  interpretation of response or lack thereof to symptom management which can be quite subjective.  - strategy for recdonditioning reviewed  ? Allergy/asthma > no cough / rhinitis/ atopic background and no variability now that off progesterone make this less likely   ?anemia/thyroid dz >  No anemia but clearly hypothyroid chemically (see separate a/p) but would not likely contribute to doe   ? A bunch of PE's > D dimer nl - while a normal  or high normal value (seen commonly in the elderly or chronically ill)  may miss small peripheral pe, the clot burden with sob is moderately high and the d dimer  has a very high neg pred value if used in this setting.    ? CHF> excluded with bnp so low with active symptoms   >> next step is cpst if not better with above recs and  COVID - 19 restrictions have been lifted.     Hypothyroidism, acquired Pt admits to stopping thyroid meds, clinically not hypothyroid for sure but needs f/u with PCP to get back on replacement before she is.     Total time devoted to counseling  > 50 % of initial 60 min office visit:  reviewed case with pt/ directly observed portions of ambulatory 02 saturation study/  discussion of options/alternatives/ personally creating written customized instructions  in presence of  pt  then going over those specific  Instructions directly with the pt including how to use all of the meds but in particular covering each new medication in detail and the difference between the maintenance= "automatic" meds and the prns using an action plan format for the latter (If this problem/symptom => do that organization reading Left to right).  Please see AVS from this visit for a full list of these instructions which I personally wrote for this pt and  are unique to this visit.      Christinia Gully, MD 12/22/2018

## 2018-12-21 NOTE — Patient Instructions (Signed)
To get the most out of exercise, you need to be continuously aware that you are short of breath, but never out of breath, for 30 minutes daily. As you improve, it will actually be easier for you to do the same amount of exercise  in  30 minutes so always push to the level where you are short of breath.      Please remember to go to the lab and x-ray department   for your tests - we will call you with the results when they are available.

## 2018-12-22 ENCOUNTER — Encounter: Payer: Self-pay | Admitting: Internal Medicine

## 2018-12-22 DIAGNOSIS — E039 Hypothyroidism, unspecified: Secondary | ICD-10-CM | POA: Insufficient documentation

## 2018-12-22 LAB — CBC WITH DIFFERENTIAL/PLATELET
Basophils Absolute: 0.1 10*3/uL (ref 0.0–0.1)
Basophils Relative: 1 % (ref 0.0–3.0)
Eosinophils Absolute: 0.1 10*3/uL (ref 0.0–0.7)
Eosinophils Relative: 1 % (ref 0.0–5.0)
HCT: 41.6 % (ref 36.0–46.0)
Hemoglobin: 14.1 g/dL (ref 12.0–15.0)
Lymphocytes Relative: 24.2 % (ref 12.0–46.0)
Lymphs Abs: 1.6 10*3/uL (ref 0.7–4.0)
MCHC: 33.8 g/dL (ref 30.0–36.0)
MCV: 94.1 fl (ref 78.0–100.0)
Monocytes Absolute: 0.4 10*3/uL (ref 0.1–1.0)
Monocytes Relative: 5.7 % (ref 3.0–12.0)
Neutro Abs: 4.5 10*3/uL (ref 1.4–7.7)
Neutrophils Relative %: 68.1 % (ref 43.0–77.0)
Platelets: 251 10*3/uL (ref 150.0–400.0)
RBC: 4.42 Mil/uL (ref 3.87–5.11)
RDW: 13.5 % (ref 11.5–15.5)
WBC: 6.6 10*3/uL (ref 4.0–10.5)

## 2018-12-22 LAB — BASIC METABOLIC PANEL
BUN: 31 mg/dL — ABNORMAL HIGH (ref 6–23)
CO2: 25 mEq/L (ref 19–32)
Calcium: 9.6 mg/dL (ref 8.4–10.5)
Chloride: 100 mEq/L (ref 96–112)
Creatinine, Ser: 0.93 mg/dL (ref 0.40–1.20)
GFR: 61.84 mL/min (ref >60.00)
Glucose, Bld: 86 mg/dL (ref 70–99)
Potassium: 3.9 mEq/L (ref 3.5–5.1)
Sodium: 135 mEq/L (ref 135–145)

## 2018-12-22 LAB — BRAIN NATRIURETIC PEPTIDE: Pro B Natriuretic peptide (BNP): 10 pg/mL (ref 0.0–100.0)

## 2018-12-22 LAB — TSH: TSH: 27.69 u[IU]/mL — ABNORMAL HIGH (ref 0.35–4.50)

## 2018-12-22 NOTE — Assessment & Plan Note (Signed)
Pt admits to stopping thyroid meds, clinically not hypothyroid for sure but needs f/u with PCP to get back on replacement before she is.

## 2018-12-22 NOTE — Assessment & Plan Note (Addendum)
Onset 2010, with sob at rest improved off progesterone - 12/21/2018   Walked RA  2 laps @  approx 210ft each @ fast pace  stopped due to  End of study, min sob, sats still 98%    Symptoms are markedly disproportionate to objective findings and not clear to what extent this is actually a pulmonary  problem but pt does appear to have difficult to sort out respiratory symptoms of unknown origin for which  DDX  = almost all start with A and  include Adherence, Ace Inhibitors, Acid Reflux, Active Sinus Disease, Alpha 1 Antitripsin deficiency, Anxiety masquerading as Airways dz,  ABPA,  Allergy(esp in young), Aspiration (esp in elderly), Adverse effects of meds,  Active smoking or Vaping, A bunch of PE's/clot burden (a few small clots can't cause this syndrome unless there is already severe underlying pulm or vascular dz with poor reserve),  Anemia or thyroid disorder, plus two Bs  = Bronchiectasis and Beta blocker use..and one C= CHF     Adherence is always the initial "prime suspect" and is a multilayered concern that requires a "trust but verify" approach in every patient - starting with knowing how to use medications, especially inhalers, correctly, keeping up with refills and understanding the fundamental difference between maintenance and prns vs those medications only taken for a very short course and then stopped and not refilled.   ? Adverse drug effects > better off progesterone in high doses which both increases resp drive and risks worsening gerd   ? Anxiety/depression/ deconditioning  > usually at the bottom of this list of usual suspects but should be much higher on this pt's based on H and P and note already on psychotropics and may interfere with adherence and also interpretation of response or lack thereof to symptom management which can be quite subjective.  - strategy for recdonditioning reviewed  ? Allergy/asthma > no cough / rhinitis/ atopic background and no variability now that off  progesterone make this less likely   ?anemia/thyroid dz >  No anemia but clearly hypothyroid chemically (see separate a/p) but would not likely contribute to doe   ? A bunch of PE's > D dimer nl - while a normal  or high normal value (seen commonly in the elderly or chronically ill)  may miss small peripheral pe, the clot burden with sob is moderately high and the d dimer  has a very high neg pred value if used in this setting.    ? CHF> excluded with bnp so low with active symptoms   >> next step is cpst if not better with above recs and  COVID - 19 restrictions have been lifted.    Total time devoted to counseling  > 50 % of initial 60 min office visit:  reviewed case with pt/ directly observed portions of ambulatory 02 saturation study/  discussion of options/alternatives/ personally creating written customized instructions  in presence of pt  then going over those specific  Instructions directly with the pt including how to use all of the meds but in particular covering each new medication in detail and the difference between the maintenance= "automatic" meds and the prns using an action plan format for the latter (If this problem/symptom => do that organization reading Left to right).  Please see AVS from this visit for a full list of these instructions which I personally wrote for this pt and  are unique to this visit.

## 2018-12-22 NOTE — Progress Notes (Signed)
LMTCB

## 2018-12-23 ENCOUNTER — Institutional Professional Consult (permissible substitution): Payer: BLUE CROSS/BLUE SHIELD | Admitting: Emergency Medicine

## 2018-12-30 DIAGNOSIS — E039 Hypothyroidism, unspecified: Secondary | ICD-10-CM | POA: Diagnosis not present

## 2019-01-05 DIAGNOSIS — M76812 Anterior tibial syndrome, left leg: Secondary | ICD-10-CM | POA: Diagnosis not present

## 2019-01-05 DIAGNOSIS — M76811 Anterior tibial syndrome, right leg: Secondary | ICD-10-CM | POA: Diagnosis not present

## 2019-01-12 ENCOUNTER — Encounter: Payer: Self-pay | Admitting: Physician Assistant

## 2019-01-12 ENCOUNTER — Ambulatory Visit: Payer: BLUE CROSS/BLUE SHIELD | Admitting: Physician Assistant

## 2019-01-12 ENCOUNTER — Other Ambulatory Visit: Payer: Self-pay

## 2019-01-12 DIAGNOSIS — F422 Mixed obsessional thoughts and acts: Secondary | ICD-10-CM | POA: Diagnosis not present

## 2019-01-12 DIAGNOSIS — G4700 Insomnia, unspecified: Secondary | ICD-10-CM | POA: Diagnosis not present

## 2019-01-12 DIAGNOSIS — F900 Attention-deficit hyperactivity disorder, predominantly inattentive type: Secondary | ICD-10-CM | POA: Diagnosis not present

## 2019-01-12 DIAGNOSIS — F319 Bipolar disorder, unspecified: Secondary | ICD-10-CM

## 2019-01-12 NOTE — Progress Notes (Signed)
[facility]  Patient ID: Lindsay [name],  MRN: [id number]  PCP: Damaris Hippo, MD (Inactive)  Date of Evaluation: 01/12/2019 Time spent:15 minutes  Chief Complaint:  Chief Complaint    Follow-up      HISTORY/CURRENT STATUS: HPI For routine med check.   (FYI all her meds have been deleted from [facility] unknown reason.)  Patient states she has been doing very well.  Her moods have been stable.  She is sleeping well.  Feels that her medicines are working.  Her worst time is usually in the winter with decreased mood so this is a good time of year for her.  Patient denies loss of interest in usual activities and is able to enjoy things.  Denies decreased energy or motivation.  Appetite has not changed.  No extreme sadness, tearfulness, or feelings of hopelessness.  Denies any changes in concentration, making decisions or remembering things.  Denies suicidal or homicidal thoughts.  Patient denies increased energy with decreased need for sleep, no increased talkativeness, no racing thoughts, no impulsivity or risky behaviors, no increased spending, no increased libido, no grandiosity.  Denies hallucinations.  She has not routinely been taking the Vyvanse.  Feels like she has not needed it as much.  Denies dizziness, syncope, seizures, numbness, tingling, tremor, tics, unsteady gait, slurred speech, confusion. Denies muscle or joint pain, stiffness, or dystonia.  Individual Medical History/ Review of Systems: Changes? :No    Past medications for mental health diagnoses include: Geodon, Lamictal, Latuda, Paxil, trazodone, Ambien, Seroquel, Vraylar, Risperdal, Saphris, Depakote, Fanapt, Abilify, Rexulti, Wellbutrin, Provigil, Nuvigil, Vyvanse  Allergies: Patient has no known allergies.  Current Medications:  Current Outpatient Medications:  .  lamoTRIgine (LAMICTAL) 200 MG tablet, Take 200 mg by mouth daily., Disp: , Rfl:  .  PARoxetine (PAXIL) 30 MG tablet, Take 45 mg by mouth  daily., Disp: , Rfl:  .  QUEtiapine (SEROQUEL) 200 MG tablet, Take 200 mg by mouth at bedtime., Disp: , Rfl:  .  traZODone (DESYREL) 100 MG tablet, Take 100-200 mg by mouth at bedtime as needed for sleep., Disp: , Rfl:  .  liothyronine (CYTOMEL) 5 MCG tablet, , Disp: , Rfl:  .  SYNTHROID 112 MCG tablet, TK 1 T PO QAM OES, Disp: , Rfl:  Medication Side Effects: none  Family Medical/ Social History: Changes? No  MENTAL HEALTH EXAM:  There were no vitals taken for this visit.There is no height or weight on file to calculate BMI.  General Appearance: Casual  Eye Contact:  Good  Speech:  Clear and Coherent  Volume:  Normal  Mood:  Euthymic  Affect:  Appropriate  Thought Process:  Goal Directed  Orientation:  Full (Time, Place, and Person)  Thought Content: Logical   Suicidal Thoughts:  No  Homicidal Thoughts:  No  Memory:  WNL  Judgement:  Good  Insight:  Good  Psychomotor Activity:  Normal  Concentration:  Concentration: Good and Attention Span: Good  Recall:  Good  Fund of Knowledge: Good  Language: Good  Assets:  Desire for Improvement  ADL's:  Intact  Cognition: WNL  Prognosis:  Good    DIAGNOSES:    ICD-10-CM   1. Bipolar I disorder ([place])  F31.9   2. Attention deficit hyperactivity disorder (ADHD), predominantly inattentive type  F90.0   3. Mixed obsessional thoughts and acts  F42.2   4. Insomnia, unspecified type  G47.00     Receiving Psychotherapy: No    RECOMMENDATIONS:  Continue Lamictal 200 mg daily.  Continue Paxil 45 mg a day. Continue Seroquel 200 mg nightly. Continue trazodone 100 mg, 1-2 nightly as needed sleep. Continue Vyvanse 10 mg every morning as needed. Return in 3 months.  Melony Overlyeresa Diahann Guajardo, PA-C   This record has been created using [employer] software.  Chart creation errors have been sought, but may not always have been located and corrected. Such creation errors do not reflect on the standard of medical care.

## 2019-03-29 ENCOUNTER — Ambulatory Visit: Payer: BC Managed Care – PPO | Admitting: Physician Assistant

## 2019-03-31 ENCOUNTER — Telehealth: Payer: Self-pay | Admitting: Physician Assistant

## 2019-03-31 NOTE — Telephone Encounter (Signed)
Patient stopped in office and wanted to know if you can put her back on the 10 mg, Vyvanse going through a lot right now.  She has appt, 10/06

## 2019-04-01 NOTE — Telephone Encounter (Signed)
Please get her pharmacy and I'll send in an Rx Thanks

## 2019-04-01 NOTE — Telephone Encounter (Signed)
[facility] on [address]

## 2019-04-04 ENCOUNTER — Other Ambulatory Visit: Payer: Self-pay | Admitting: Physician Assistant

## 2019-04-04 MED ORDER — LISDEXAMFETAMINE DIMESYLATE 10 MG PO CAPS: 10.0000 mg | ORAL_CAPSULE | Freq: Every day | ORAL | 0 refills | Status: DC

## 2019-04-04 NOTE — Telephone Encounter (Signed)
Rx sent 

## 2019-04-05 ENCOUNTER — Ambulatory Visit (INDEPENDENT_AMBULATORY_CARE_PROVIDER_SITE_OTHER): Payer: BC Managed Care – PPO | Admitting: Physician Assistant

## 2019-04-05 ENCOUNTER — Encounter: Payer: Self-pay | Admitting: Physician Assistant

## 2019-04-05 ENCOUNTER — Other Ambulatory Visit: Payer: Self-pay

## 2019-04-05 DIAGNOSIS — F429 Obsessive-compulsive disorder, unspecified: Secondary | ICD-10-CM | POA: Diagnosis not present

## 2019-04-05 DIAGNOSIS — F319 Bipolar disorder, unspecified: Secondary | ICD-10-CM | POA: Diagnosis not present

## 2019-04-05 DIAGNOSIS — F900 Attention-deficit hyperactivity disorder, predominantly inattentive type: Secondary | ICD-10-CM

## 2019-04-05 DIAGNOSIS — G4700 Insomnia, unspecified: Secondary | ICD-10-CM

## 2019-04-05 MED ORDER — LISDEXAMFETAMINE DIMESYLATE 20 MG PO CAPS: 20.0000 mg | ORAL_CAPSULE | Freq: Every day | ORAL | 0 refills | Status: DC

## 2019-04-05 NOTE — Progress Notes (Signed)
[facility]  Patient ID: Lindsay [name],  MRN: [id number]  PCP: Damaris Hippo, MD (Inactive)  Date of Evaluation: 01/12/2019 Time spent:15 minutes  Chief Complaint:  Chief Complaint    Follow-up      HISTORY/CURRENT STATUS: HPI For routine med check.   Patient states she has been doing very well.  Her moods have been stable.  She is sleeping well.  Feels that her medicines are working. The biggest problem is that she hasn't been able to focus like she needs to. She had left over Vyvanse and tried the 10 mg, but that did not help so she increased to 20 mg.  That has been much more helpful.  In the past, at higher doses of Vyvanse, she became manic.  She reports no symptoms of mania at this time.  She would like to stay on the 20 mg if it is appropriate.  Patient denies loss of interest in usual activities and is able to enjoy things.  Denies decreased energy or motivation.  Appetite has not changed.  No extreme sadness, tearfulness, or feelings of hopelessness.  Denies any changes in concentration, making decisions or remembering things.  Denies suicidal or homicidal thoughts.  Patient denies increased energy with decreased need for sleep, no increased talkativeness, no racing thoughts, no impulsivity or risky behaviors, no increased spending, no increased libido, no grandiosity.  Denies hallucinations..  Denies dizziness, syncope, seizures, numbness, tingling, tremor, tics, unsteady gait, slurred speech, confusion. Denies muscle or joint pain, stiffness, or dystonia.  Individual Medical History/ Review of Systems: Changes? :No    Past medications for mental health diagnoses include: Geodon, Lamictal, Latuda, Paxil, trazodone, Ambien, Seroquel, Vraylar, Risperdal, Saphris, Depakote, Fanapt, Abilify, Rexulti, Wellbutrin, Provigil, Nuvigil, Vyvanse  Allergies: Patient has no known allergies.  Current Medications:  Current Outpatient Medications:  .  lamoTRIgine (LAMICTAL)  200 MG tablet, Take 200 mg by mouth daily., Disp: , Rfl:  .  liothyronine (CYTOMEL) 5 MCG tablet, , Disp: , Rfl:  .  PARoxetine (PAXIL) 30 MG tablet, Take 45 mg by mouth daily., Disp: , Rfl:  .  QUEtiapine (SEROQUEL) 200 MG tablet, Take 200 mg by mouth at bedtime., Disp: , Rfl:  .  SYNTHROID 112 MCG tablet, TK 1 T PO QAM OES, Disp: , Rfl:  .  traZODone (DESYREL) 100 MG tablet, Take 100-200 mg by mouth at bedtime as needed for sleep., Disp: , Rfl:  .  [START ON 04/17/2019] lisdexamfetamine (VYVANSE) 20 MG capsule, Take 1 capsule (20 mg total) by mouth daily., Disp: 30 capsule, Rfl: 0 Medication Side Effects: none  Family Medical/ Social History: Changes? No  MENTAL HEALTH EXAM:  There were no vitals taken for this visit.There is no height or weight on file to calculate BMI.  General Appearance: Casual  Eye Contact:  Good  Speech:  Clear and Coherent  Volume:  Normal  Mood:  Euthymic  Affect:  Appropriate  Thought Process:  Goal Directed and Descriptions of Associations: Intact  Orientation:  Full (Time, Place, and Person)  Thought Content: Logical   Suicidal Thoughts:  No  Homicidal Thoughts:  No  Memory:  WNL  Judgement:  Good  Insight:  Good  Psychomotor Activity:  Normal  Concentration:  Concentration: Good and Attention Span: Good  Recall:  Good  Fund of Knowledge: Good  Language: Good  Assets:  Desire for Improvement  ADL's:  Intact  Cognition: WNL  Prognosis:  Good    DIAGNOSES:    ICD-10-CM  1. Attention deficit hyperactivity disorder (ADHD), predominantly inattentive type  F90.0   2. Bipolar I disorder ([facility])  F31.9   3. Obsessive-compulsive disorder, unspecified type  F42.9   4. Insomnia, unspecified type  G47.00     Receiving Psychotherapy: No    RECOMMENDATIONS:  Increase Vyvanse to 20 mg p.o. every morning.  She will use up to 10 mg, taking 2 p.o. every morning.  When that is gone, get the new prescription filled.  I have written a note to the pharmacist  saying she could fill that early, which will be October 18. Continue Lamictal 200 mg daily. Continue Paxil 45 mg a day. Continue Seroquel 200 mg nightly. Continue trazodone 100 mg, 1-2 nightly as needed sleep. Return in 4 to 6 weeks.  [name], PA-C

## 2019-04-11 ENCOUNTER — Telehealth: Payer: Self-pay | Admitting: Physician Assistant

## 2019-04-11 NOTE — Telephone Encounter (Signed)
Pt thinks that her vyvanse dosage is working ok, but she thinks that it is causing her to have more racing thoughts. Pt would like to talk to someone.

## 2019-04-12 NOTE — Telephone Encounter (Signed)
Have her go back to the 10mg  qd.  It's not worth her having racing thoughts to have more 'focus.' If she didn't pick up Rx for 20 mg, call and cancel it at her pharmacy.  If she did, she'll have to bring the full bottle in for [place] to dispose of, and then I'll send in Rx for 10 mg.  Thanks

## 2019-04-12 NOTE — Telephone Encounter (Signed)
Pt. States that the 20 Mg is helping so much with her mood, focus and she is able to move around more. She says it is helping with her seasonal depression as well. She really does not want to cut it back. She would like her seroquel to be increased. She increased it her self to 400 mg slowly and it is doing wonders. She says She feels so much better and would like you to consider making this a permanent change. Please advise.

## 2019-04-14 ENCOUNTER — Ambulatory Visit: Payer: BC Managed Care – PPO | Admitting: Physician Assistant

## 2019-04-14 ENCOUNTER — Telehealth: Payer: Self-pay | Admitting: Physician Assistant

## 2019-04-14 NOTE — Telephone Encounter (Signed)
Yes, it's ok to stay on Vyvanse 20 mg, and stay at the Seroquel 400 mg since she already increased it.  Does she need it sent in?  Tell her to stop changing the doses on her own!

## 2019-04-14 NOTE — Telephone Encounter (Signed)
Pt. Made aware and wanted me to let you know that everything is going well since she changed the dose. She stated that the racing thoughts are usually in the morning but after exercising they subside. She is sleeping better and is having to take less Trazodone now. She says thank you very much and she does not need any more Seroquel sent in at the moment. She will call if she does.

## 2019-04-14 NOTE — Telephone Encounter (Signed)
Patient stated the racing thoughts have gotten better, also is you need to speak with her husband so he can share with you that she's not Mania it would be good for her to stay on the 20 mg., of Vyvanse

## 2019-04-18 DIAGNOSIS — C44329 Squamous cell carcinoma of skin of other parts of face: Secondary | ICD-10-CM | POA: Diagnosis not present

## 2019-04-18 DIAGNOSIS — D485 Neoplasm of uncertain behavior of skin: Secondary | ICD-10-CM | POA: Diagnosis not present

## 2019-04-18 DIAGNOSIS — E039 Hypothyroidism, unspecified: Secondary | ICD-10-CM | POA: Diagnosis not present

## 2019-04-18 NOTE — Telephone Encounter (Signed)
Note reviewed.

## 2019-04-22 DIAGNOSIS — E039 Hypothyroidism, unspecified: Secondary | ICD-10-CM | POA: Diagnosis not present

## 2019-04-22 DIAGNOSIS — E669 Obesity, unspecified: Secondary | ICD-10-CM | POA: Diagnosis not present

## 2019-04-22 DIAGNOSIS — E049 Nontoxic goiter, unspecified: Secondary | ICD-10-CM | POA: Diagnosis not present

## 2019-05-06 ENCOUNTER — Institutional Professional Consult (permissible substitution): Payer: BC Managed Care – PPO | Admitting: Plastic Surgery

## 2019-05-10 ENCOUNTER — Ambulatory Visit: Payer: BC Managed Care – PPO | Admitting: Physician Assistant

## 2019-05-16 ENCOUNTER — Telehealth: Payer: Self-pay | Admitting: Physician Assistant

## 2019-05-16 ENCOUNTER — Other Ambulatory Visit: Payer: Self-pay

## 2019-05-16 NOTE — Telephone Encounter (Signed)
Last refill 11/20 for 20 mg #30 Pended for approval

## 2019-05-16 NOTE — Telephone Encounter (Signed)
Pt came in office requesting refill for Vyvance @ [facility] on [address]

## 2019-05-17 DIAGNOSIS — L821 Other seborrheic keratosis: Secondary | ICD-10-CM | POA: Diagnosis not present

## 2019-05-17 DIAGNOSIS — D229 Melanocytic nevi, unspecified: Secondary | ICD-10-CM | POA: Diagnosis not present

## 2019-05-17 DIAGNOSIS — L814 Other melanin hyperpigmentation: Secondary | ICD-10-CM | POA: Diagnosis not present

## 2019-05-17 DIAGNOSIS — L819 Disorder of pigmentation, unspecified: Secondary | ICD-10-CM | POA: Diagnosis not present

## 2019-05-17 MED ORDER — LISDEXAMFETAMINE DIMESYLATE 20 MG PO CAPS: 20.0000 mg | ORAL_CAPSULE | Freq: Every day | ORAL | 0 refills | Status: DC

## 2019-05-18 DIAGNOSIS — G4721 Circadian rhythm sleep disorder, delayed sleep phase type: Secondary | ICD-10-CM | POA: Diagnosis not present

## 2019-05-18 DIAGNOSIS — G47419 Narcolepsy without cataplexy: Secondary | ICD-10-CM | POA: Diagnosis not present

## 2019-05-24 ENCOUNTER — Ambulatory Visit (INDEPENDENT_AMBULATORY_CARE_PROVIDER_SITE_OTHER): Payer: Self-pay | Admitting: Plastic Surgery

## 2019-05-24 ENCOUNTER — Other Ambulatory Visit: Payer: Self-pay

## 2019-05-24 DIAGNOSIS — Z719 Counseling, unspecified: Secondary | ICD-10-CM

## 2019-05-30 ENCOUNTER — Ambulatory Visit (INDEPENDENT_AMBULATORY_CARE_PROVIDER_SITE_OTHER): Payer: BC Managed Care – PPO | Admitting: Adult Health

## 2019-05-30 ENCOUNTER — Other Ambulatory Visit: Payer: Self-pay

## 2019-05-30 ENCOUNTER — Encounter: Payer: Self-pay | Admitting: Adult Health

## 2019-05-30 DIAGNOSIS — G4700 Insomnia, unspecified: Secondary | ICD-10-CM

## 2019-05-30 DIAGNOSIS — F900 Attention-deficit hyperactivity disorder, predominantly inattentive type: Secondary | ICD-10-CM | POA: Diagnosis not present

## 2019-05-30 DIAGNOSIS — F429 Obsessive-compulsive disorder, unspecified: Secondary | ICD-10-CM

## 2019-05-30 DIAGNOSIS — F319 Bipolar disorder, unspecified: Secondary | ICD-10-CM | POA: Diagnosis not present

## 2019-05-30 DIAGNOSIS — F422 Mixed obsessional thoughts and acts: Secondary | ICD-10-CM

## 2019-05-30 DIAGNOSIS — F3341 Major depressive disorder, recurrent, in partial remission: Secondary | ICD-10-CM | POA: Diagnosis not present

## 2019-05-30 NOTE — Progress Notes (Signed)
Lindsay [name] [id number] 04-21-1961 58 y.o.  Subjective:   Patient ID:  Lindsay [name] is a 58 y.o. (DOB 10-30-1960) female.  Chief Complaint: No chief complaint on file.   HPI   Lindsay [name] presents to the office today for follow-up of BPD, ADHD, GAD, OCD, and insomnia.   Describes mood today as "ok". Pleasant. Mood symptoms - denies depression, anxiety, and irritability.  Has been to see "sleep doctor" - has a circadium rhythm seep disorder and narcolepsy. Feels sleepy in the afternoon. Takes Vyvanse 20mg  in the morning and that helps with energy and motivation to get things done. Also feels like it helps with her depression. Sleep doctor wanting to add 5mg  to 10mg  of Vyvanse in the afternoons to help with late afternoon sleepiness. Stating "I would only take it if I needed it". Most days "crashes" in the afternoons. Likes to stay active in the afternoon. Feels like the "winter" months are worse for her. Has tried light therapy but it doesn't help. Stable interest and motivation. Taking medications as prescribed.  Energy levels stable. Active, does not have a regular exercise routine.  Enjoys some usual interests and activities. Married. Lives with husband of 41 years. Has 3 grown children - 2 local. Spending time with family. Appetite adequate. Weight loss. Sleeps well most nights. Averages 6 to 8 hours - "I get enough". Focus and concentration stable. Completing tasks. Managing aspects of household. Housewife. Denies SI or HI. Denies AH or VH.  Review of Systems:  Review of Systems  Musculoskeletal: Negative for gait problem.  Neurological: Negative for tremors, seizures, weakness and headaches.  Psychiatric/Behavioral: Negative for sleep disturbance. The patient is not nervous/anxious.     Medications: I have reviewed the patient's current medications.  Current Outpatient Medications  Medication Sig Dispense Refill  . lamoTRIgine (LAMICTAL) 200 MG tablet Take 200 mg by mouth  daily.    liothyronine (CYTOMEL) 5 MCG tablet     . lisdexamfetamine (VYVANSE) 20 MG capsule Take 1 capsule (20 mg total) by mouth daily. 30 capsule 0  . PARoxetine (PAXIL) 30 MG tablet Take 45 mg by mouth daily.    . QUEtiapine (SEROQUEL) 200 MG tablet Take 200 mg by mouth at bedtime.    SYNTHROID 112 MCG tablet TK 1 T PO QAM OES    . traZODone (DESYREL) 100 MG tablet Take 100-200 mg by mouth at bedtime as needed for sleep.     No current facility-administered medications for this visit.     Medication Side Effects: None  Allergies: No Known Allergies  Past Medical History:  Diagnosis Date  . Anxiety   . Bipolar disorder (HCC)   . Depression    OCD  . Hypothyroidism     No family history on file.  Social History   Socioeconomic History  . Marital status: Married    Spouse name: Not on file  . Number of children: Not on file  . Years of education: Not on file  . Highest education level: Not on file  Occupational History  . Not on file  Social Needs  . Financial resource strain: Not on file  . Food insecurity    Worry: Not on file    Inability: Not on file  . Transportation needs    Medical: Not on file    Non-medical: Not on file  Tobacco Use  . Smoking status: Never Smoker  . Smokeless tobacco: Never Used  Substance and Sexual Activity  .  Alcohol use: Yes    Alcohol/week: 2.0 standard drinks    Types: 2 Cans of beer per week    Comment: per week maybe  . Drug use: No  . Sexual activity: Not on file  Lifestyle  . Physical activity    Days per week: Not on file    Minutes per session: Not on file  . Stress: Not on file  Relationships  . Social Musicianconnections    Talks on phone: Not on file    Gets together: Not on file    Attends religious service: Not on file    Active member of club or organization: Not on file    Attends meetings of clubs or organizations: Not on file    Relationship status: Not on file  . Intimate partner violence    Fear of  current or ex partner: Not on file    Emotionally abused: Not on file    Physically abused: Not on file    Forced sexual activity: Not on file  Other Topics Concern  . Not on file  Social History Narrative  . Not on file    Past Medical History, Surgical history, Social history, and Family history were reviewed and updated as appropriate.   Please see review of systems for further details on the patient's review from today.   Objective:   Physical Exam:  There were no vitals taken for this visit.  Physical Exam Constitutional:      General: She is not in acute distress.    Appearance: She is well-developed.  Musculoskeletal:        General: No deformity.  Neurological:     Mental Status: She is alert and oriented to person, place, and time.     Coordination: Coordination normal.  Psychiatric:        Attention and Perception: Attention and perception normal. She does not perceive auditory or visual hallucinations.        Mood and Affect: Mood normal. Mood is not anxious or depressed. Affect is not labile, blunt, angry or inappropriate.        Speech: Speech normal.        Behavior: Behavior normal.        Thought Content: Thought content normal. Thought content is not paranoid or delusional. Thought content does not include homicidal or suicidal ideation. Thought content does not include homicidal or suicidal plan.        Cognition and Memory: Cognition and memory normal.        Judgment: Judgment normal.     Comments: Insight intact     Lab Review:     Component Value Date/Time   NA 135 12/21/2018 1659   K 3.9 12/21/2018 1659   CL 100 12/21/2018 1659   CO2 25 12/21/2018 1659   GLUCOSE 86 12/21/2018 1659   BUN 31 (H) 12/21/2018 1659   CREATININE 0.93 12/21/2018 1659   CALCIUM 9.6 12/21/2018 1659       Component Value Date/Time   WBC 6.6 12/21/2018 1659   RBC 4.42 12/21/2018 1659   HGB 14.1 12/21/2018 1659   HCT 41.6 12/21/2018 1659   PLT 251.0 12/21/2018 1659    MCV 94.1 12/21/2018 1659   MCH 31.1 06/02/2015 0615   MCHC 33.8 12/21/2018 1659   RDW 13.5 12/21/2018 1659   LYMPHSABS 1.6 12/21/2018 1659   MONOABS 0.4 12/21/2018 1659   EOSABS 0.1 12/21/2018 1659   BASOSABS 0.1 12/21/2018 1659    No results found for:  POCLITH, LITHIUM   No results found for: PHENYTOIN, PHENOBARB, VALPROATE, CBMZ   .res Assessment: Plan:    Plan:  Continue Vyvanse to 20 mg p.o. every morning.   Continue Lamictal 200 mg daily. Continue Paxil 45 mg a day. Continue Seroquel 200 mg nightly. Continue Trazodone 100 mg, 1-2 nightly as needed sleep.  Return in 4 to 6 weeks.  Have patient send over report from sleep doctor - [facility].  Patient advised to contact office with any questions, adverse effects, or acute worsening in signs and symptoms.  Discussed potential metabolic side effects associated with atypical antipsychotics, as well as potential risk for movement side effects. Advised pt to contact office if movement side effects occur.   Discussed potential benefits, risks, and side effects of stimulants with patient to include increased heart rate, palpitations, insomnia, increased anxiety, increased irritability, or decreased appetite.  Instructed patient to contact office if experiencing any significant tolerability issues.  Counseled patient regarding potential benefits, risks, and side effects of Lamictal to include potential risk of Stevens-Johnson syndrome. Advised patient to stop taking Lamictal and contact office immediately if rash develops and to seek urgent medical attention if rash is severe and/or spreading quickly.  Diagnoses and all orders for this visit:  Bipolar I disorder ([place])  Attention deficit hyperactivity disorder (ADHD), predominantly inattentive type  Obsessive-compulsive disorder, unspecified type  Insomnia, unspecified type  Mixed obsessional thoughts and acts     Please see After Visit Summary for patient  specific instructions.  No future appointments.  No orders of the defined types were placed in this encounter.   -------------------------------

## 2019-06-01 ENCOUNTER — Encounter: Payer: Self-pay | Admitting: Plastic Surgery

## 2019-06-01 ENCOUNTER — Ambulatory Visit: Payer: BC Managed Care – PPO | Admitting: Physician Assistant

## 2019-06-01 DIAGNOSIS — Z719 Counseling, unspecified: Secondary | ICD-10-CM | POA: Insufficient documentation

## 2019-06-01 NOTE — Progress Notes (Signed)
Patient ID: Satira Mccallum, female    DOB: [birth year], 58 y.o.   MRN: [id number]   Chief Complaint  Patient presents with  . Advice Only    The patient is a 58 yrs old wf here for evaluation / consultation for facial rejuvenation.  She does not like the jowls and neck loose tissue and skin.  She is otherwise healthy.  She has loss of midface volume and some fat herniation of the lower lids.  She has some hyperpigmentation of the facial skin.    Review of Systems  Constitutional: Negative.   HENT: Negative.   Eyes: Negative.   Respiratory: Negative.   Cardiovascular: Negative.   Gastrointestinal: Negative.   Endocrine: Negative.   Genitourinary: Negative.   Musculoskeletal: Negative.   Hematological: Negative.   Psychiatric/Behavioral: Negative.     Past Medical History:  Diagnosis Date  . Anxiety   . Bipolar disorder (HCC)   . Depression    OCD  . Hypothyroidism     Past Surgical History:  Procedure Laterality Date  . BLADDER SUSPENSION N/A 06/01/2015   Procedure: TRANSVAGINAL TAPE (TVT) PROCEDURE;  Surgeon: Osborn Coho, MD;  Location: [facility];  Service: Gynecology;  Laterality: N/A;  . cesarean section times two     . CYSTOSCOPY N/A 06/01/2015   Procedure: CYSTOSCOPY;  Surgeon: Osborn Coho, MD;  Location: [facility];  Service: Gynecology;  Laterality: N/A;  . DIAGNOSTIC LAPAROSCOPY    . DILATATION & CURRETTAGE/HYSTEROSCOPY WITH RESECTOCOPE N/A 06/01/2015   Procedure: DILATATION & CURETTAGE/HYSTEROSCOPY WITH RESECTOCOPE;  Surgeon: Osborn Coho, MD;  Location: [facility];  Service: Gynecology;  Laterality: N/A;  . DILATION AND CURETTAGE OF UTERUS    . FRACTURE SURGERY     on right heel   . LAPAROSCOPIC GASTRIC SLEEVE RESECTION        Current Outpatient Medications:  .  lamoTRIgine (LAMICTAL) 200 MG tablet, Take 200 mg by mouth daily., Disp: , Rfl:  .  liothyronine (CYTOMEL) 5 MCG tablet, , Disp: , Rfl:  .  lisdexamfetamine (VYVANSE) 20 MG capsule, Take 1 capsule (20  mg total) by mouth daily., Disp: 30 capsule, Rfl: 0 .  PARoxetine (PAXIL) 30 MG tablet, Take 45 mg by mouth daily., Disp: , Rfl:  .  QUEtiapine (SEROQUEL) 200 MG tablet, Take 200 mg by mouth at bedtime., Disp: , Rfl:  .  SYNTHROID 112 MCG tablet, TK 1 T PO QAM OES, Disp: , Rfl:  .  traZODone (DESYREL) 100 MG tablet, Take 100-200 mg by mouth at bedtime as needed for sleep., Disp: , Rfl:    Objective:   Vitals:   05/24/19 1043  BP: (!) 147/77  Pulse: (!) 54  Temp: 98.2 F (36.8 C)  SpO2: 98%    Physical Exam Vitals signs and nursing note reviewed.  Constitutional:      Appearance: Normal appearance.  Eyes:     Extraocular Movements: Extraocular movements intact.  Cardiovascular:     Rate and Rhythm: Normal rate.     Pulses: Normal pulses.  Pulmonary:     Effort: Pulmonary effort is normal.  Abdominal:     General: Abdomen is flat. There is no distension.     Tenderness: There is no abdominal tenderness.  Musculoskeletal: Normal range of motion.  Skin:    General: Skin is warm.  Neurological:     General: No focal deficit present.     Mental Status: She is alert and oriented to person, place, and time.  Psychiatric:  Mood and Affect: Mood normal.        Behavior: Behavior normal.        Thought Content: Thought content normal.     Assessment & Plan:  Encounter for counseling  Patient a candidate for face / neck lift with or without eyelids.  She would like a quote and then think about her options. Pictures were obtained of the patient and placed in the chart with the patient's or guardian's permission.   [place], DO

## 2019-06-02 ENCOUNTER — Encounter: Payer: Self-pay | Admitting: Physician Assistant

## 2019-06-02 ENCOUNTER — Telehealth: Payer: Self-pay | Admitting: Physician Assistant

## 2019-06-02 DIAGNOSIS — G4721 Circadian rhythm sleep disorder, delayed sleep phase type: Secondary | ICD-10-CM

## 2019-06-02 DIAGNOSIS — G47419 Narcolepsy without cataplexy: Secondary | ICD-10-CM | POA: Insufficient documentation

## 2019-06-02 HISTORY — DX: Circadian rhythm sleep disorder, delayed sleep phase type: G47.21

## 2019-06-07 NOTE — Telephone Encounter (Signed)
Thanks

## 2019-06-08 DIAGNOSIS — D0439 Carcinoma in situ of skin of other parts of face: Secondary | ICD-10-CM | POA: Diagnosis not present

## 2019-06-09 ENCOUNTER — Other Ambulatory Visit: Payer: Self-pay | Admitting: Physician Assistant

## 2019-06-09 NOTE — Telephone Encounter (Signed)
Lindsay [name] called to request of her Vyvanse.  She also wandering what the status is of [name] prescribing her Adderall.  Please let her know if she will take over the med.  Next appt 06/30/19

## 2019-06-10 ENCOUNTER — Telehealth: Payer: Self-pay | Admitting: Physician Assistant

## 2019-06-10 ENCOUNTER — Other Ambulatory Visit: Payer: Self-pay | Admitting: Physician Assistant

## 2019-06-10 MED ORDER — AMPHETAMINE-DEXTROAMPHETAMINE 5 MG PO TABS: 5.0000 mg | ORAL_TABLET | Freq: Every day | ORAL | 0 refills | Status: DC

## 2019-06-10 MED ORDER — LISDEXAMFETAMINE DIMESYLATE 20 MG PO CAPS: 20.0000 mg | ORAL_CAPSULE | Freq: Every day | ORAL | 0 refills | Status: DC

## 2019-06-10 MED ORDER — AMPHETAMINE-DEXTROAMPHET ER 5 MG PO CP24: 5.0000 mg | ORAL_CAPSULE | Freq: Every day | ORAL | 0 refills | Status: DC

## 2019-06-10 NOTE — Telephone Encounter (Signed)
Which drug is she talking about?

## 2019-06-10 NOTE — Telephone Encounter (Signed)
Pharmacy called to advise they do not have in stock. [facility] on [address] does have. Please send new Rx there so it can be pickup today.  Pharmacy stated she would close out the Rx at [facility] on [address]

## 2019-06-10 NOTE — Telephone Encounter (Signed)
Adderall is the medication per pt.

## 2019-06-10 NOTE — Telephone Encounter (Signed)
Rx sent 

## 2019-06-13 DIAGNOSIS — Z23 Encounter for immunization: Secondary | ICD-10-CM | POA: Diagnosis not present

## 2019-06-13 DIAGNOSIS — Z13220 Encounter for screening for lipoid disorders: Secondary | ICD-10-CM | POA: Diagnosis not present

## 2019-06-13 DIAGNOSIS — Z0000 Encounter for general adult medical examination without abnormal findings: Secondary | ICD-10-CM | POA: Diagnosis not present

## 2019-06-14 DIAGNOSIS — F3341 Major depressive disorder, recurrent, in partial remission: Secondary | ICD-10-CM | POA: Diagnosis not present

## 2019-06-20 ENCOUNTER — Telehealth: Payer: Self-pay | Admitting: Physician Assistant

## 2019-06-20 ENCOUNTER — Other Ambulatory Visit: Payer: Self-pay | Admitting: Psychiatry

## 2019-06-20 NOTE — Telephone Encounter (Signed)
Return telephone call to husband Lindsay [name].  Patient has been cutting down on some of her medication including her Seroquel.  Did not sleep much last couple of nights.  He has noted she has been more aggressive since taking Vyvanse.  Adderall recently added.  Symptoms include hyperverbal speech, decreased sleep, paranoia with ideas of reference and religious delusions.  She is having a paranoid manic episode.  Discussed criteria for hospitalization which may be required.  He may be able to get her to cooperate with medication recommendations which in the short-term would be stop all stimulant.  Take quetiapine 200 mg now and repeat quetiapine 200 mg at bedtime.  We will see her emergently tomorrow at 5 PM if she can make it to that appointment without requiring hospitalization

## 2019-06-20 NOTE — Progress Notes (Signed)
Return telephone call to husband Lindsay [name].  Patient has been cutting down on some of her medication including her Seroquel.  Did not sleep much last couple of nights.  He has noted she has been more aggressive since taking Vyvanse.  Adderall recently added.  Symptoms include hyperverbal speech, decreased sleep, paranoia with ideas of reference and religious delusions.  She is having a paranoid manic episode.  Discussed criteria for hospitalization which may be required.  He may be able to get her to cooperate with medication recommendations which in the short-term would be stop all stimulant.  Take quetiapine 200 mg now and repeat quetiapine 200 mg at bedtime.  We will see her emergently tomorrow at 5 PM if she can make it to that appointment without requiring hospitalization 

## 2019-06-20 NOTE — Telephone Encounter (Signed)
Schedule her to see me as a work in Tuesday 12-22 at 5 PM.  Her husband is aware of the appointment and will bring her.

## 2019-06-20 NOTE — Telephone Encounter (Signed)
Pt husband Elta [place] left message stating patient has gone totally full blown paranoid-schizophrenia. Reaching out to provider. Don't know what to do. Please advise ASAP! [phone]

## 2019-06-21 ENCOUNTER — Encounter: Payer: Self-pay | Admitting: Psychiatry

## 2019-06-21 ENCOUNTER — Ambulatory Visit (INDEPENDENT_AMBULATORY_CARE_PROVIDER_SITE_OTHER): Payer: BC Managed Care – PPO | Admitting: Psychiatry

## 2019-06-21 DIAGNOSIS — G47419 Narcolepsy without cataplexy: Secondary | ICD-10-CM

## 2019-06-21 DIAGNOSIS — F429 Obsessive-compulsive disorder, unspecified: Secondary | ICD-10-CM

## 2019-06-21 DIAGNOSIS — G4700 Insomnia, unspecified: Secondary | ICD-10-CM

## 2019-06-21 DIAGNOSIS — F302 Manic episode, severe with psychotic symptoms: Secondary | ICD-10-CM | POA: Diagnosis not present

## 2019-06-21 NOTE — Progress Notes (Signed)
Lindsay [name] [id number] 12-17-1960 58 y.o.  Virtual Visit via Telephone Note  I connected with pt on 06/21/19 at  5:00 PM EST by telephone and verified that I am speaking with the correct person using two identifiers.   I discussed the limitations, risks, security and privacy concerns of performing an evaluation and management service by telephone and the availability of in person appointments. I also discussed with the patient that there may be a patient responsible charge related to this service. The patient expressed understanding and agreed to proceed.   I discussed the assessment and treatment plan with the patient. The patient was provided an opportunity to ask questions and all were answered. The patient agreed with the plan and demonstrated an understanding of the instructions.   The patient was advised to call back or seek an in-person evaluation if the symptoms worsen or if the condition fails to improve as anticipated.  I provided 30 minutes of non-face-to-face time during this encounter.  The patient was located at home.  The provider was located at [facility].   Lauraine Rinne, MD   Subjective:   Patient ID:  Lindsay [name] is a 58 y.o. (DOB 09-01-1960) female.  Chief Complaint:  Chief Complaint  Patient presents with  . Follow-up    Medication Management  . Other    Bipolar  . Agitation  . Manic Behavior  . Paranoid    HPI Lindsay [name] presents for follow-up of bipolar manic paranoid psychosis with acute onset.  Received phone call from patient's husband yesterday indicating patient had not slept much in a couple of days and had reduced her Seroquel.  She had become gradually more hyper religious and paranoid with ideas of reference and hyperactivity and rapid speech and somewhat threatening.  She did agree to stop the Vyvanse and Adderall which may have been contributing.  She agreed to take her medications last night and to schedule this emergency  appointment today.  She thinks what happened was after MOHS surgery Dec 9 and couldn't exercise 7 days and that set her off.  Also not eating much either and one night stayed up all  Night.  Today jogged and needs to exercise to feel well.  Last night slept well.  Slept 12 hours last night.  Took 400 mg Seroquel last night.   More sleepy off the stimulant.  Narcolepsy dx.  Modafinil and Nuvigil didn't help.  Was not combining vyvanse and adderall.  Was taking one or the other.  No changes in meds lately.  H confronted she had reduced Seroquel on her own.  She did so bc it slows her down.  Admits it's been a month.    Review of Systems:  Review of Systems  Neurological: Negative for tremors and weakness.    Medications: I have reviewed the patient's current medications.  Current Outpatient Medications  Medication Sig Dispense Refill  . lamoTRIgine (LAMICTAL) 200 MG tablet TAKE 1 TABLET(200 MG) BY MOUTH DAILY 30 tablet 1  . liothyronine (CYTOMEL) 5 MCG tablet     . PARoxetine (PAXIL) 30 MG tablet Take 45 mg by mouth daily.    . QUEtiapine (SEROQUEL) 200 MG tablet Take 200 mg by mouth at bedtime.    Marland Kitchen SYNTHROID 112 MCG tablet TK 1 T PO QAM OES    . traZODone (DESYREL) 100 MG tablet Take 100-200 mg by mouth at bedtime as needed for sleep.    Marland Kitchen amphetamine-dextroamphetamine (ADDERALL) 5 MG tablet  Take 1 tablet (5 mg total) by mouth daily in the afternoon. (Patient not taking: Reported on 06/21/2019) 30 tablet 0  . lisdexamfetamine (VYVANSE) 20 MG capsule Take 1 capsule (20 mg total) by mouth daily. (Patient not taking: Reported on 06/21/2019) 30 capsule 0   No current facility-administered medications for this visit.    Medication Side Effects: Sedation  Allergies: No Known Allergies  Past Medical History:  Diagnosis Date  . Anxiety   . Bipolar disorder (HCC)   . 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 Theressa MillardJames Osborne  . Depression    OCD  . Hypothyroidism      History reviewed. No pertinent family history.  Social History   Socioeconomic History  . Marital status: Married    Spouse name: Not on file  . Number of children: Not on file  . Years of education: Not on file  . Highest education level: Not on file  Occupational History  . Not on file  Tobacco Use  . Smoking status: Never Smoker  . Smokeless tobacco: Never Used  Substance and Sexual Activity  . Alcohol use: Yes    Alcohol/week: 2.0 standard drinks    Types: 2 Cans of beer per week    Comment: per week maybe  . Drug use: No  . Sexual activity: Not on file  Other Topics Concern  . Not on file  Social History Narrative  . Not on file   Social Determinants of Health   Financial Resource Strain:   . Difficulty of Paying Living Expenses: Not on file  Food Insecurity:   . Worried About Programme researcher, broadcasting/film/videounning Out of Food in the Last Year: Not on file  . Ran Out of Food in the Last Year: Not on file  Transportation Needs:   . Lack of Transportation (Medical): Not on file  . Lack of Transportation (Non-Medical): Not on file  Physical Activity:   . Days of Exercise per Week: Not on file  . Minutes of Exercise per Session: Not on file  Stress:   . Feeling of Stress : Not on file  Social Connections:   . Frequency of Communication with Friends and Family: Not on file  . Frequency of Social Gatherings with Friends and Family: Not on file  . Attends Religious Services: Not on file  . Active Member of Clubs or Organizations: Not on file  . Attends BankerClub or Organization Meetings: Not on file  . Marital Status: Not on file  Intimate Partner Violence:   . Fear of Current or Ex-Partner: Not on file  . Emotionally Abused: Not on file  . Physically Abused: Not on file  . Sexually Abused: Not on file    Past Medical History, Surgical history, Social history, and Family history were reviewed and updated as appropriate.   Please see review of systems for further details on the patient's  review from today.   Objective:   Physical Exam:  There were no vitals taken for this visit.  Physical Exam Neurological:     Mental Status: She is alert and oriented to person, place, and time.     Cranial Nerves: No dysarthria.  Psychiatric:        Attention and Perception: Attention and perception normal.        Mood and Affect: Mood normal. Affect is not labile, angry or tearful.        Speech: Speech normal.        Behavior: Behavior is  not agitated or aggressive. Behavior is cooperative.        Thought Content: Thought content normal. Thought content is not paranoid or delusional. Thought content does not include homicidal or suicidal ideation. Thought content does not include homicidal or suicidal plan.        Cognition and Memory: Cognition and memory normal.     Comments: Insight fair. Mildly pressured speech. No delusions elicited.     Lab Review:     Component Value Date/Time   NA 135 12/21/2018 1659   K 3.9 12/21/2018 1659   CL 100 12/21/2018 1659   CO2 25 12/21/2018 1659   GLUCOSE 86 12/21/2018 1659   BUN 31 (H) 12/21/2018 1659   CREATININE 0.93 12/21/2018 1659   CALCIUM 9.6 12/21/2018 1659       Component Value Date/Time   WBC 6.6 12/21/2018 1659   RBC 4.42 12/21/2018 1659   HGB 14.1 12/21/2018 1659   HCT 41.6 12/21/2018 1659   PLT 251.0 12/21/2018 1659   MCV 94.1 12/21/2018 1659   MCH 31.1 06/02/2015 0615   MCHC 33.8 12/21/2018 1659   RDW 13.5 12/21/2018 1659   LYMPHSABS 1.6 12/21/2018 1659   MONOABS 0.4 12/21/2018 1659   EOSABS 0.1 12/21/2018 1659   BASOSABS 0.1 12/21/2018 1659    No results found for: POCLITH, LITHIUM   No results found for: PHENYTOIN, PHENOBARB, VALPROATE, CBMZ   .res Assessment: Plan:    Mala was seen today for follow-up, other, agitation, manic behavior and paranoid.  Diagnoses and all orders for this visit:  Bipolar I disorder, single manic episode, severe, with psychosis (HCC)  Primary narcolepsy without  cataplexy  Obsessive-compulsive disorder, unspecified type  Insomnia, unspecified type  Patient's husband called yesterday reporting the patient was manic, hyperactive hyper religious and paranoid gradually worsening over the last 2 weeks or so.  This was an emergency appointment.  There have been no recent medicine changes of significance other than the patient admits that she is been gradually cutting back on the Seroquel because it tends to make her a little lethargic during the day.  She did cooperate and take the full 400 mg Seroquel recommended last night and slept 12 hours.  She feels fine today.  Husband reports she is more cooperative today and not as delusional.  She reports she will be compliant with medication.  Informed the patient and her husband that Seroquel 200 mg daily is typically the lowest effective mood stabilizing dose of Seroquel and if necessary alternatives could be pursued.  However she agrees to continue Seroquel and will take 300 mg nightly and hold the stimulant until after Christmas.  She has an appointment with her primary provider [name] next week.  In summary I believe the patient's manic episode which was brief but significant was due to her noncompliance with the full dose of Seroquel.  Recommend she hold the trazodone and if needs help for sleep take extra Seroquel instead.  If she is compliant with 200 mg nightly over the long-term she is likely to be able to return to use of the Vyvanse as she had been stable doing so for an extended period of time.  We discussed criteria for hospitalization.  She does not meet those criteria at this time.  Continue Seroquel 300 mg until appointment with [name] next week.  At that point she may be able to reduce the dose if she is stable.  Hold the Vyvanse until her appointment with [name] next  week  Described emergency on call service which is available as needed.  Lynder Parents MD, DFAPA Please see  After Visit Summary for patient specific instructions.  Future Appointments  Date Time Provider [place]  06/30/2019 10:30 AM Donnal Moat T, PA-C CP-CP None    No orders of the defined types were placed in this encounter.     -------------------------------

## 2019-06-21 NOTE — Telephone Encounter (Signed)
Thank you for seeing her.  She has narcolepsy dx by sleep study and that was the reason for recent addition of Adderall.  She had been stable on Vyvanse alone, if I remember correctly.

## 2019-06-30 ENCOUNTER — Other Ambulatory Visit: Payer: Self-pay

## 2019-06-30 ENCOUNTER — Ambulatory Visit (INDEPENDENT_AMBULATORY_CARE_PROVIDER_SITE_OTHER): Payer: BC Managed Care – PPO | Admitting: Physician Assistant

## 2019-06-30 ENCOUNTER — Encounter: Payer: Self-pay | Admitting: Physician Assistant

## 2019-06-30 DIAGNOSIS — F900 Attention-deficit hyperactivity disorder, predominantly inattentive type: Secondary | ICD-10-CM | POA: Diagnosis not present

## 2019-06-30 DIAGNOSIS — F429 Obsessive-compulsive disorder, unspecified: Secondary | ICD-10-CM

## 2019-06-30 DIAGNOSIS — G4700 Insomnia, unspecified: Secondary | ICD-10-CM

## 2019-06-30 DIAGNOSIS — F302 Manic episode, severe with psychotic symptoms: Secondary | ICD-10-CM | POA: Diagnosis not present

## 2019-06-30 DIAGNOSIS — G47419 Narcolepsy without cataplexy: Secondary | ICD-10-CM | POA: Diagnosis not present

## 2019-06-30 MED ORDER — QUETIAPINE FUMARATE 300 MG PO TABS: 300.0000 mg | ORAL_TABLET | Freq: Every day | ORAL | 1 refills | Status: DC

## 2019-06-30 NOTE — Progress Notes (Signed)
[facility]  Patient ID: TZIPORA MCINROY,  MRN: [id number]  PCP: [name], MD (Inactive)  Date of Evaluation: 06/30/2019 Time spent:30 minutes  Chief Complaint:  Chief Complaint    ADD      HISTORY/CURRENT STATUS: HPI For f/u from emergency room visit last week due to mania and hallucinations.  Her husband, [name], accompanies her today.  Patient states she is doing much better.  She stopped taking the Vyvanse at the direction of Dr. Jennelle Human.  She increased the Seroquel back up to 300 mg and has felt better emotionally.  She has had no further hallucinations or paranoia.  Her husband corroborates that.  She has stopped the trazodone and has not felt as exhausted every day.  The biggest problem with the current treatment is that she is having more hunger again since increasing the Seroquel back up.  Also not having a stimulant to curb her appetite has been an issue.  She has not been able to walk for exercise like she has been in the past, partly because of the Mohs procedure that she had done a few weeks ago but also the mental emergencies she had.  She and I have discussed this many times concerning her exercise routine.  She benefits greatly from consistent heart exercise mostly walking several miles every day.  She enjoys doing that so it has been hard for her not to do so these past few weeks.  Since she was here last week and stopped stimulants, she has had a couple of days where she was so lethargic she could not get out of bed.  "That depresses me just as much as anything else does.  I do not want to stay in bed all the time."  She admits, and her husband agrees, that she took Adderall 2.5 mg 1 day just to get out of bed, which was helpful.  She had no manic symptoms afterwards.  Of importance is that she had not started the Adderall prior to the manic episode with psychosis a week or so ago.  She was only taking the Vyvanse for the ADD/narcolepsy.  She is able to enjoy  things.  Feels more clear headed now that she is back on the Seroquel at a more therapeutic dose even though it is only been a week or so.  Energy and motivation are low since she is not on a stimulant right now.  She does not cry easily.  She denies suicidal or homicidal thoughts.  Denies increased energy with decreased need for sleep.  No increased libido, increased spending, impulsivity, or risky behavior.  No grandiosity.  No hallucinations.  No paranoia.  Denies dizziness, syncope, seizures, numbness, tingling, tremor, tics, unsteady gait, slurred speech, confusion. Denies muscle or joint pain, stiffness, or dystonia.  Individual Medical History/ Review of Systems: Changes? :Yes  see previous note from Dr. Jennelle Human.   Past medications for mental health diagnoses include: Geodon, Lamictal, Latuda, Paxil, trazodone, Ambien, Seroquel, Vraylar, Risperdal, Saphris, Depakote, Fanapt, Abilify, Rexulti, Wellbutrin, Provigil, Nuvigil, Vyvanse  Allergies: Patient has no known allergies.  Current Medications:  Current Outpatient Medications:  .  lamoTRIgine (LAMICTAL) 200 MG tablet, TAKE 1 TABLET(200 MG) BY MOUTH DAILY, Disp: 30 tablet, Rfl: 1 .  liothyronine (CYTOMEL) 5 MCG tablet, , Disp: , Rfl:  .  PARoxetine (PAXIL) 30 MG tablet, Take 45 mg by mouth daily., Disp: , Rfl:  .  SYNTHROID 112 MCG tablet, TK 1 T PO QAM OES, Disp: , Rfl:  .  traZODone (DESYREL) 100 MG tablet, Take 100-200 mg by mouth at bedtime as needed for sleep., Disp: , Rfl:  .  amphetamine-dextroamphetamine (ADDERALL) 5 MG tablet, Take 1 tablet (5 mg total) by mouth daily in the afternoon. (Patient not taking: Reported on 06/21/2019), Disp: 30 tablet, Rfl: 0 .  lisdexamfetamine (VYVANSE) 20 MG capsule, Take 1 capsule (20 mg total) by mouth daily. (Patient not taking: Reported on 06/21/2019), Disp: 30 capsule, Rfl: 0 .  QUEtiapine (SEROQUEL) 300 MG tablet, Take 1 tablet (300 mg total) by mouth at bedtime., Disp: 30 tablet, Rfl:  1 Medication Side Effects: none  Family Medical/ Social History: Changes? No  MENTAL HEALTH EXAM:  There were no vitals taken for this visit.There is no height or weight on file to calculate BMI.  General Appearance: Casual, Neat and Well Groomed  Eye Contact:  Good  Speech:  Clear and Coherent  Volume:  Normal  Mood:  Euthymic  Affect:  Appropriate  Thought Process:  Goal Directed  Orientation:  Full (Time, Place, and Person)  Thought Content: Logical   Suicidal Thoughts:  No  Homicidal Thoughts:  No  Memory:  WNL  Judgement:  Good  Insight:  Good  Psychomotor Activity:  Normal  Concentration:  Concentration: Fair and Attention Span: Fair  Recall:  Good  Fund of Knowledge: Good  Language: Good  Assets:  Desire for Improvement  ADL's:  Intact  Cognition: WNL  Prognosis:  Good    DIAGNOSES:    ICD-10-CM   1. Bipolar I disorder, single manic episode, severe, with psychosis ([place])  F30.2   2. Obsessive-compulsive disorder, unspecified type  F42.9   3. 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 Attention deficit hyperactivity disorder (ADHD), predominantly inattentive type  F90.0   5. Insomnia, unspecified type  G47.00     Receiving Psychotherapy: No    RECOMMENDATIONS:  I spent 30 minutes with her and her husband face-to-face and we discussed the recent emergency, medication compliance, and discussing with me in the future if there are side effects that are intolerable with her medicines. I am glad to see her doing so much better.  Madalene, her husband, and I discussed the fact that her mania and psychotic episode was most likely brought on by her lowering the Seroquel dose.  After reviewing Dr. Casimiro Needle note from last week, and discussing with the patient and her husband, I feel it is safe to add back the Vyvanse.  She can also take Adderall 2.5 to 5 mg in the afternoon as needed.  Patient and husband know what to watch for if she becomes agitated or irritable or  shows any signs of mania to stop those drugs and call immediately. Restart Vyvanse 20 mg every morning. Start Adderall 5 mg, 1/2-1 p.o. q. afternoon as needed. Continue Lamictal 200 mg 1 p.o. daily. Continue Paxil 45 mg daily. Continue Seroquel 300 mg 1 nightly. Return in 3 to 4 weeks.  Donnal Moat, PA-C

## 2019-07-13 DIAGNOSIS — D1801 Hemangioma of skin and subcutaneous tissue: Secondary | ICD-10-CM | POA: Diagnosis not present

## 2019-07-13 DIAGNOSIS — L814 Other melanin hyperpigmentation: Secondary | ICD-10-CM | POA: Diagnosis not present

## 2019-07-13 DIAGNOSIS — L819 Disorder of pigmentation, unspecified: Secondary | ICD-10-CM | POA: Diagnosis not present

## 2019-07-13 DIAGNOSIS — L570 Actinic keratosis: Secondary | ICD-10-CM | POA: Diagnosis not present

## 2019-07-15 DIAGNOSIS — Z03818 Encounter for observation for suspected exposure to other biological agents ruled out: Secondary | ICD-10-CM | POA: Diagnosis not present

## 2019-07-15 DIAGNOSIS — R6889 Other general symptoms and signs: Secondary | ICD-10-CM | POA: Diagnosis not present

## 2019-07-15 DIAGNOSIS — R300 Dysuria: Secondary | ICD-10-CM | POA: Diagnosis not present

## 2019-07-20 DIAGNOSIS — Z1231 Encounter for screening mammogram for malignant neoplasm of breast: Secondary | ICD-10-CM | POA: Diagnosis not present

## 2019-07-20 DIAGNOSIS — Z01419 Encounter for gynecological examination (general) (routine) without abnormal findings: Secondary | ICD-10-CM | POA: Diagnosis not present

## 2019-07-20 DIAGNOSIS — N3946 Mixed incontinence: Secondary | ICD-10-CM | POA: Diagnosis not present

## 2019-07-20 DIAGNOSIS — Z113 Encounter for screening for infections with a predominantly sexual mode of transmission: Secondary | ICD-10-CM | POA: Diagnosis not present

## 2019-07-20 DIAGNOSIS — Z124 Encounter for screening for malignant neoplasm of cervix: Secondary | ICD-10-CM | POA: Diagnosis not present

## 2019-07-21 ENCOUNTER — Other Ambulatory Visit: Payer: Self-pay

## 2019-07-21 ENCOUNTER — Telehealth: Payer: Self-pay | Admitting: Physician Assistant

## 2019-07-21 MED ORDER — LISDEXAMFETAMINE DIMESYLATE 20 MG PO CAPS: 20.0000 mg | ORAL_CAPSULE | Freq: Every day | ORAL | 0 refills | Status: DC

## 2019-07-21 MED ORDER — AMPHETAMINE-DEXTROAMPHETAMINE 5 MG PO TABS: 5.0000 mg | ORAL_TABLET | Freq: Every day | ORAL | 0 refills | Status: DC

## 2019-07-21 NOTE — Telephone Encounter (Signed)
Alexiz called to request refills of her Vyvanse and Adderall.  Next appt 08/02/19.  Send to [facility] on [address]

## 2019-07-21 NOTE — Telephone Encounter (Signed)
Last refill Adderall 06/10/2019 Last refill Vyvanse 06/14/2019  Both pended for approval for Lindsay [name]

## 2019-07-27 ENCOUNTER — Other Ambulatory Visit: Payer: Self-pay | Admitting: Physician Assistant

## 2019-07-27 DIAGNOSIS — E039 Hypothyroidism, unspecified: Secondary | ICD-10-CM | POA: Diagnosis not present

## 2019-08-02 ENCOUNTER — Ambulatory Visit: Payer: BC Managed Care – PPO | Admitting: Physician Assistant

## 2019-08-05 DIAGNOSIS — M8580 Other specified disorders of bone density and structure, unspecified site: Secondary | ICD-10-CM | POA: Diagnosis not present

## 2019-08-08 ENCOUNTER — Other Ambulatory Visit: Payer: Self-pay | Admitting: Physician Assistant

## 2019-08-10 ENCOUNTER — Other Ambulatory Visit: Payer: Self-pay

## 2019-08-10 ENCOUNTER — Telehealth: Payer: Self-pay | Admitting: Physician Assistant

## 2019-08-10 MED ORDER — LAMOTRIGINE 200 MG PO TABS: ORAL_TABLET | ORAL | 0 refills | Status: DC

## 2019-08-10 NOTE — Telephone Encounter (Signed)
Pt would like a refill onLamictal 200mg  90day supply. Send to on [address]

## 2019-08-10 NOTE — Telephone Encounter (Signed)
Rx for Lamotrigine 200 mg 1 daily #90 submitted

## 2019-08-15 DIAGNOSIS — Z85828 Personal history of other malignant neoplasm of skin: Secondary | ICD-10-CM | POA: Diagnosis not present

## 2019-08-15 DIAGNOSIS — L819 Disorder of pigmentation, unspecified: Secondary | ICD-10-CM | POA: Diagnosis not present

## 2019-08-15 DIAGNOSIS — L570 Actinic keratosis: Secondary | ICD-10-CM | POA: Diagnosis not present

## 2019-08-15 DIAGNOSIS — L905 Scar conditions and fibrosis of skin: Secondary | ICD-10-CM | POA: Diagnosis not present

## 2019-08-16 ENCOUNTER — Other Ambulatory Visit: Payer: Self-pay

## 2019-08-16 ENCOUNTER — Encounter: Payer: Self-pay | Admitting: Physician Assistant

## 2019-08-16 ENCOUNTER — Ambulatory Visit (INDEPENDENT_AMBULATORY_CARE_PROVIDER_SITE_OTHER): Payer: BC Managed Care – PPO | Admitting: Physician Assistant

## 2019-08-16 DIAGNOSIS — F429 Obsessive-compulsive disorder, unspecified: Secondary | ICD-10-CM | POA: Diagnosis not present

## 2019-08-16 DIAGNOSIS — F319 Bipolar disorder, unspecified: Secondary | ICD-10-CM

## 2019-08-16 DIAGNOSIS — G47419 Narcolepsy without cataplexy: Secondary | ICD-10-CM | POA: Diagnosis not present

## 2019-08-16 DIAGNOSIS — F900 Attention-deficit hyperactivity disorder, predominantly inattentive type: Secondary | ICD-10-CM | POA: Diagnosis not present

## 2019-08-16 MED ORDER — QUETIAPINE FUMARATE 300 MG PO TABS: 450.0000 mg | ORAL_TABLET | Freq: Every day | ORAL | 1 refills | Status: DC

## 2019-08-16 MED ORDER — AMPHETAMINE-DEXTROAMPHETAMINE 5 MG PO TABS: 5.0000 mg | ORAL_TABLET | Freq: Every day | ORAL | 0 refills | Status: DC

## 2019-08-16 MED ORDER — LISDEXAMFETAMINE DIMESYLATE 20 MG PO CAPS: 20.0000 mg | ORAL_CAPSULE | Freq: Every day | ORAL | 0 refills | Status: DC

## 2019-08-16 MED ORDER — AMPHETAMINE-DEXTROAMPHET ER 20 MG PO CP24: 20.0000 mg | ORAL_CAPSULE | Freq: Every day | ORAL | 0 refills | Status: DC

## 2019-08-16 NOTE — Progress Notes (Signed)
[facility]  Patient ID: Lindsay [name],  MRN: [id number]  PCP: [name], MD (Inactive)  Date of Evaluation: 08/16/2019 Time spent:20 minutes  Chief Complaint:  Chief Complaint    Depression; Insomnia; ADD      HISTORY/CURRENT STATUS: HPI For routine med check.  She increased the Seroquel to 450 mg b/c she was having more trouble sleeping. She's sleeping much better now.   Obsessive thoughts are a little worse now.  Feels guilty about doing something good for herself. Like she went shopping yesterday and felt guilty. "I think the OCD is coming out more, but I've always been that way about shopping."  Compulsions are no worse.  Patient denies loss of interest in usual activities and is able to enjoy things.  Denies decreased energy or motivation.  Appetite has not changed.  No extreme sadness, tearfulness, or feelings of hopelessness.  Denies any changes in concentration, making decisions or remembering things.  Denies suicidal or homicidal thoughts.  Patient denies increased energy with decreased need for sleep, no increased talkativeness, no racing thoughts, no impulsivity or risky behaviors, no increased spending, no increased libido, no grandiosity.  She is able to focus and complete tasks in a timely manner.  Since we have added the stimulants back in a couple of months ago, she has done well without any signs of mania.  Plus she is not falling asleep all throughout the day secondary to the narcolepsy.  "I feel good most of the time."  Denies dizziness, syncope, seizures, numbness, tingling, tremor, tics, unsteady gait, slurred speech, confusion. Denies muscle or joint pain, stiffness, or dystonia.  Individual Medical History/ Review of Systems: Changes? :No    Past medications for mental health diagnoses include: Geodon, Lamictal, Latuda, Paxil, trazodone, Ambien, Seroquel, Vraylar, Risperdal, Saphris, Depakote, Fanapt, Abilify, Rexulti, Wellbutrin, Provigil,  Nuvigil, Vyvanse  Allergies: Patient has no known allergies.  Current Medications:  Current Outpatient Medications:  .  amphetamine-dextroamphetamine (ADDERALL) 5 MG tablet, Take 1 tablet (5 mg total) by mouth daily in the afternoon., Disp: 30 tablet, Rfl: 0 .  lamoTRIgine (LAMICTAL) 200 MG tablet, TAKE 1 TABLET(200 MG) BY MOUTH DAILY, Disp: 90 tablet, Rfl: 0 .  liothyronine (CYTOMEL) 5 MCG tablet, , Disp: , Rfl:  .  lisdexamfetamine (VYVANSE) 20 MG capsule, Take 1 capsule (20 mg total) by mouth daily., Disp: 30 capsule, Rfl: 0 .  PARoxetine (PAXIL) 30 MG tablet, TAKE 1&1/2 TABLET BY MOUTH DAILY, Disp: 135 tablet, Rfl: 0 .  SYNTHROID 112 MCG tablet, TK 1 T PO QAM OES, Disp: , Rfl:  .  traZODone (DESYREL) 100 MG tablet, Take 100-200 mg by mouth at bedtime as needed for sleep., Disp: , Rfl:  .  [START ON 08/18/2019] amphetamine-dextroamphetamine (ADDERALL) 5 MG tablet, Take 1 tablet (5 mg total) by mouth daily., Disp: 30 tablet, Rfl: 0 .  [START ON 09/14/2019] amphetamine-dextroamphetamine (ADDERALL) 5 MG tablet, Take 1 tablet (5 mg total) by mouth daily., Disp: 30 tablet, Rfl: 0 .  [START ON 08/18/2019] lisdexamfetamine (VYVANSE) 20 MG capsule, Take 1 capsule (20 mg total) by mouth daily., Disp: 30 capsule, Rfl: 0 .  [START ON 09/14/2019] lisdexamfetamine (VYVANSE) 20 MG capsule, Take 1 capsule (20 mg total) by mouth daily., Disp: 30 capsule, Rfl: 0 .  [START ON 10/14/2019] lisdexamfetamine (VYVANSE) 20 MG capsule, Take 1 capsule (20 mg total) by mouth daily., Disp: 30 capsule, Rfl: 0 .  QUEtiapine (SEROQUEL) 300 MG tablet, Take 1.5 tablets (450 mg total) by mouth at  bedtime., Disp: 135 tablet, Rfl: 1 Medication Side Effects: none  Family Medical/ Social History: Changes? No  MENTAL HEALTH EXAM:  There were no vitals taken for this visit.There is no height or weight on file to calculate BMI.  General Appearance: Casual, Neat and Well Groomed  Eye Contact:  Good  Speech:  Clear and Coherent   Volume:  Normal  Mood:  Euthymic  Affect:  Appropriate  Thought Process:  Goal Directed and Descriptions of Associations: Intact  Orientation:  Full (Time, Place, and Person)  Thought Content: Logical   Suicidal Thoughts:  No  Homicidal Thoughts:  No  Memory:  WNL  Judgement:  Good  Insight:  Good  Psychomotor Activity:  Normal  Concentration:  Concentration: Good and Attention Span: Good  Recall:  Good  Fund of Knowledge: Good  Language: Good  Assets:  Desire for Improvement  ADL's:  Intact  Cognition: WNL  Prognosis:  Good    DIAGNOSES:    ICD-10-CM   1. Bipolar I disorder ([place])  F31.9   2. Obsessive-compulsive disorder, unspecified type  F42.9   3. Attention deficit hyperactivity disorder (ADHD), predominantly inattentive type  F90.0   4. Primary narcolepsy without cataplexy  G47.419     Receiving Psychotherapy: No    RECOMMENDATIONS:  I spent 20 minutes with her. PDMP was reviewed. Continue Vyvanse 20 mg 1 p.o. every morning. Continue Adderall 5 mg 1 daily in the afternoon. Continue Lamictal 200 mg 1 daily. Continue Paxil 30 mg, 1.5 pills daily. Continue Seroquel 300 mg, 1.5 pills nightly. Continue trazodone 100 mg, 1-2 nightly as needed. Return in 6 weeks.  Donnal Moat, PA-C

## 2019-08-17 ENCOUNTER — Encounter: Payer: Self-pay | Admitting: Physician Assistant

## 2019-08-19 DIAGNOSIS — S6990XA Unspecified injury of unspecified wrist, hand and finger(s), initial encounter: Secondary | ICD-10-CM | POA: Diagnosis not present

## 2019-08-19 DIAGNOSIS — H1132 Conjunctival hemorrhage, left eye: Secondary | ICD-10-CM | POA: Diagnosis not present

## 2019-08-19 DIAGNOSIS — M7989 Other specified soft tissue disorders: Secondary | ICD-10-CM | POA: Diagnosis not present

## 2019-08-19 DIAGNOSIS — W4904XA Ring or other jewelry causing external constriction, initial encounter: Secondary | ICD-10-CM | POA: Diagnosis not present

## 2019-08-22 DIAGNOSIS — M19041 Primary osteoarthritis, right hand: Secondary | ICD-10-CM | POA: Diagnosis not present

## 2019-09-16 ENCOUNTER — Telehealth: Payer: Self-pay | Admitting: Physician Assistant

## 2019-09-16 NOTE — Telephone Encounter (Signed)
Patient already has 2 Rx's on file for Vyvanse at her pharmacy

## 2019-09-16 NOTE — Telephone Encounter (Signed)
Pt called to request refill for Vyvanse @ [facility]. Next appt 3/30

## 2019-09-22 ENCOUNTER — Telehealth: Payer: Self-pay | Admitting: Physician Assistant

## 2019-09-22 NOTE — Telephone Encounter (Signed)
Pt requesting refill for Adderall @ [facility] on [address]. Not more refills. APPT 3/30

## 2019-09-22 NOTE — Telephone Encounter (Signed)
Contacted her pharmacy and they already had a refill on file, they will get it filled for her.

## 2019-09-27 ENCOUNTER — Encounter: Payer: Self-pay | Admitting: Physician Assistant

## 2019-09-27 ENCOUNTER — Other Ambulatory Visit: Payer: Self-pay

## 2019-09-27 ENCOUNTER — Ambulatory Visit (INDEPENDENT_AMBULATORY_CARE_PROVIDER_SITE_OTHER): Payer: BC Managed Care – PPO | Admitting: Physician Assistant

## 2019-09-27 VITALS — BP 140/78 | HR 63

## 2019-09-27 DIAGNOSIS — G47419 Narcolepsy without cataplexy: Secondary | ICD-10-CM | POA: Diagnosis not present

## 2019-09-27 DIAGNOSIS — F319 Bipolar disorder, unspecified: Secondary | ICD-10-CM

## 2019-09-27 DIAGNOSIS — F429 Obsessive-compulsive disorder, unspecified: Secondary | ICD-10-CM

## 2019-09-27 DIAGNOSIS — G4700 Insomnia, unspecified: Secondary | ICD-10-CM

## 2019-09-27 DIAGNOSIS — F900 Attention-deficit hyperactivity disorder, predominantly inattentive type: Secondary | ICD-10-CM

## 2019-09-27 MED ORDER — PAROXETINE HCL 30 MG PO TABS: 60.0000 mg | ORAL_TABLET | Freq: Every day | ORAL | 1 refills | Status: DC

## 2019-09-27 NOTE — Progress Notes (Signed)
[facility]  Patient ID: Lindsay [name],  MRN: [id number]  PCP: [name], MD (Inactive)  Date of Evaluation: 09/27/2019 Time spent:30 minutes  Chief Complaint:  Chief Complaint    ADHD      HISTORY/CURRENT STATUS: HPI For routine med check.  OCD is much worse.  Unsure when it got worse, but maybe around the same time we increased the Seroquel. (Maybe around 08/16/19 after she increased it to 450 mg herself.)  Has many high expectations, it's tiring. Exercises a lot, obsesses about that, and obsesses about not being close enough to God and that's very upsetting to her. Very discouraged. Feels like she's going to have to live like this forever and it's so painful.  Doesn't want to talk to her husband about it b/c she's afraid all he'll think about is her being sick. Has an appt with counselor, Danice Goltz, in July. Can't get in any sooner.  She has seen her before.  Compulsions are cleaning house, and exercising religiously.  She is sleeping better since being on the Seroquel 450 mg.  She has had no further psychoses since going to that dose.  Denies increased energy with decreased need for sleep.  No impulsivity or risky behaviors.  No increased spending or libido.  No grandiosity.  No increased anger or irritability.  No hallucinations.  Has been very sad at times when she thinks about her illnesses and how it is affected her whole life.  The OCD is so bad right now that it is really bringing her down.  She does not want to stay in bed and she does keep up with routine hygiene.  She just feels melancholy.  She is able to concentrate on the Vyvanse and in the afternoon Adderall.  She is on those more for the narcolepsy then ADD type symptoms.  If she does not take them, she will fall asleep wherever she is sitting, even if she has had a good night sleep before.  Review of Systems  Constitutional: Negative.   HENT: Negative.   Eyes: Negative.   Respiratory: Negative.    Cardiovascular: Negative.   Gastrointestinal: Negative.   Genitourinary: Negative.   Musculoskeletal: Negative.   Skin: Negative.   Neurological: Negative.   Endo/Heme/Allergies: Negative.   Psychiatric/Behavioral: Positive for depression.   Individual Medical History/ Review of Systems: Changes? :No    Past medications for mental health diagnoses include: Geodon, Lamictal, Latuda, Paxil, trazodone, Ambien, Seroquel, Vraylar, Risperdal, Saphris, Depakote, Fanapt, Abilify, Rexulti, Wellbutrin, Provigil, Nuvigil, Vyvanse  Allergies: Patient has no known allergies.  Current Medications:  Current Outpatient Medications:  .  amphetamine-dextroamphetamine (ADDERALL) 5 MG tablet, Take 1 tablet (5 mg total) by mouth daily in the afternoon., Disp: 30 tablet, Rfl: 0 .  amphetamine-dextroamphetamine (ADDERALL) 5 MG tablet, Take 1 tablet (5 mg total) by mouth daily., Disp: 30 tablet, Rfl: 0 .  amphetamine-dextroamphetamine (ADDERALL) 5 MG tablet, Take 1 tablet (5 mg total) by mouth daily., Disp: 30 tablet, Rfl: 0 .  lamoTRIgine (LAMICTAL) 200 MG tablet, TAKE 1 TABLET(200 MG) BY MOUTH DAILY, Disp: 90 tablet, Rfl: 0 .  liothyronine (CYTOMEL) 5 MCG tablet, , Disp: , Rfl:  .  lisdexamfetamine (VYVANSE) 20 MG capsule, Take 1 capsule (20 mg total) by mouth daily., Disp: 30 capsule, Rfl: 0 .  lisdexamfetamine (VYVANSE) 20 MG capsule, Take 1 capsule (20 mg total) by mouth daily., Disp: 30 capsule, Rfl: 0 .  lisdexamfetamine (VYVANSE) 20 MG capsule, Take 1 capsule (20 mg total)  by mouth daily., Disp: 30 capsule, Rfl: 0 .  [START ON 10/14/2019] lisdexamfetamine (VYVANSE) 20 MG capsule, Take 1 capsule (20 mg total) by mouth daily., Disp: 30 capsule, Rfl: 0 .  QUEtiapine (SEROQUEL) 300 MG tablet, Take 1.5 tablets (450 mg total) by mouth at bedtime., Disp: 135 tablet, Rfl: 1 .  SYNTHROID 112 MCG tablet, TK 1 T PO QAM OES, Disp: , Rfl:  .  traZODone (DESYREL) 100 MG tablet, Take 100-200 mg by mouth at bedtime as  needed for sleep., Disp: , Rfl:  .  PARoxetine (PAXIL) 30 MG tablet, Take 2 tablets (60 mg total) by mouth daily., Disp: 180 tablet, Rfl: 1 Medication Side Effects: none  Family Medical/ Social History: Changes? No  MENTAL HEALTH EXAM:  Blood pressure 140/78, pulse 63.There is no height or weight on file to calculate BMI.  General Appearance: Casual, Neat and Well Groomed  Eye Contact:  Good  Speech:  Clear and Coherent and Normal Rate  Volume:  Normal  Mood:  Depressed  Affect:  Depressed and Tearful  Thought Process:  Goal Directed and Descriptions of Associations: Intact  Orientation:  Full (Time, Place, and Person)  Thought Content: Logical   Suicidal Thoughts:  No  Homicidal Thoughts:  No  Memory:  WNL  Judgement:  Good  Insight:  Good  Psychomotor Activity:  Normal  Concentration:  Concentration: Good  Recall:  Good  Fund of Knowledge: Good  Language: Good  Assets:  Desire for Improvement  ADL's:  Intact  Cognition: WNL  Prognosis:  Good    DIAGNOSES:    ICD-10-CM   1. Obsessive-compulsive disorder, unspecified type  F42.9   2. Bipolar I disorder ([place])  F31.9   3. 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 Insomnia, unspecified type  G47.00   5. Attention deficit hyperactivity disorder (ADHD), predominantly inattentive type  F90.0     Receiving Psychotherapy: No    RECOMMENDATIONS:  PDMP was reviewed. I spent 30 minutes with her. We discussed different options for the OCD.  It is imperative that she get back in counseling.  She already has an appointment scheduled with a counselor she has seen in the past.  Patient states she is ready to get help from a therapist.  We can also increase the Paxil.  We agreed to do that and she knows to watch for symptoms of mania or psychosis.  If that occurs, call immediately and go ahead and decrease the Paxil back to a total of 45 mg daily.  She verbalizes understanding.  Increase Paxil to 30 mg, 2 p.o. every  morning. Continue Adderall 5 mg q. afternoon. Continue Vyvanse 20 mg p.o. every morning. Continue Seroquel 300 mg, 1.5 pills nightly. Continue trazodone 100 mg nightly as needed.  She has not been taking this as she has not needed it since increasing the Seroquel. Return in 4 weeks.  Donnal Moat, PA-C

## 2019-09-28 DIAGNOSIS — L570 Actinic keratosis: Secondary | ICD-10-CM | POA: Diagnosis not present

## 2019-10-21 ENCOUNTER — Other Ambulatory Visit: Payer: Self-pay

## 2019-10-21 ENCOUNTER — Telehealth: Payer: Self-pay | Admitting: Physician Assistant

## 2019-10-21 MED ORDER — AMPHETAMINE-DEXTROAMPHETAMINE 5 MG PO TABS: 5.0000 mg | ORAL_TABLET | Freq: Every day | ORAL | 0 refills | Status: DC

## 2019-10-21 NOTE — Telephone Encounter (Signed)
Last refill 09/22/2019, pended for Rosey Bath

## 2019-10-21 NOTE — Telephone Encounter (Signed)
Pt requesting refill for Adderall 5 mg @ [facility].  Next appt 4/27

## 2019-10-25 ENCOUNTER — Encounter: Payer: Self-pay | Admitting: Physician Assistant

## 2019-10-25 ENCOUNTER — Ambulatory Visit (INDEPENDENT_AMBULATORY_CARE_PROVIDER_SITE_OTHER): Payer: BC Managed Care – PPO | Admitting: Physician Assistant

## 2019-10-25 ENCOUNTER — Other Ambulatory Visit: Payer: Self-pay

## 2019-10-25 DIAGNOSIS — F429 Obsessive-compulsive disorder, unspecified: Secondary | ICD-10-CM | POA: Diagnosis not present

## 2019-10-25 DIAGNOSIS — G47419 Narcolepsy without cataplexy: Secondary | ICD-10-CM

## 2019-10-25 DIAGNOSIS — F319 Bipolar disorder, unspecified: Secondary | ICD-10-CM | POA: Diagnosis not present

## 2019-10-25 DIAGNOSIS — F900 Attention-deficit hyperactivity disorder, predominantly inattentive type: Secondary | ICD-10-CM | POA: Diagnosis not present

## 2019-10-25 DIAGNOSIS — G4700 Insomnia, unspecified: Secondary | ICD-10-CM

## 2019-10-25 MED ORDER — AMPHETAMINE-DEXTROAMPHETAMINE 5 MG PO TABS: 5.0000 mg | ORAL_TABLET | Freq: Every day | ORAL | 0 refills | Status: DC

## 2019-10-25 MED ORDER — LISDEXAMFETAMINE DIMESYLATE 20 MG PO CAPS: 20.0000 mg | ORAL_CAPSULE | Freq: Every day | ORAL | 0 refills | Status: DC

## 2019-10-25 MED ORDER — LAMOTRIGINE 200 MG PO TABS: ORAL_TABLET | ORAL | 1 refills | Status: DC

## 2019-10-25 NOTE — Progress Notes (Signed)
[facility]  Patient ID: Lindsay [name],  MRN: [id number]  PCP: Damaris Hippo, MD (Inactive)  Date of Evaluation: 10/25/2019 Time spent:20 minutes  Chief Complaint:  Chief Complaint    Follow-up      HISTORY/CURRENT STATUS: HPI For routine med check.  Doing well.  She decreased the Paxil b/c it didn't seem to be helping any better than 45 mg, plus she was having more constipation.  States she's feeling better w/ depression.  "Now that the weather is better I feel better."  Able to enjoy things, energy and motivation are good, no SI/HI. She hasn't been walking as much b/c she hurt her right leg at the gym.    OCD is better than it had been several months ago.  In the fall and winter every year, it gets a lot worse. It's usually has to do with spiritual things. Generalized anxiety is controlled.   Patient denies loss of interest in usual activities and is able to enjoy things.  Denies decreased energy or motivation.  Appetite has not changed.  No extreme sadness, tearfulness, or feelings of hopelessness.  Denies any changes in concentration, making decisions or remembering things.  Denies suicidal or homicidal thoughts.  Patient denies increased energy with decreased need for sleep, no increased talkativeness, no racing thoughts, no impulsivity or risky behaviors, no increased spending, no increased libido, no grandiosity, no increased irritability or anger, and no hallucinations.  Denies dizziness, syncope, seizures, numbness, tingling, tremor, tics, unsteady gait, slurred speech, confusion. Denies muscle or joint pain, stiffness, or dystonia.Denies unexplained weight loss, frequent infections, or sores that heal slowly.  No polyphagia, polydipsia, or polyuria. Denies visual changes or paresthesias.   Individual Medical History/ Review of Systems: Changes? :No    Past medications for mental health diagnoses include: Geodon, Lamictal, Latuda, Paxil, trazodone, Ambien,  Seroquel, Vraylar, Risperdal, Saphris, Depakote, Fanapt, Abilify, Rexulti, Wellbutrin, Provigil, Nuvigil, Vyvanse, ECT  Allergies: Patient has no known allergies.  Current Medications:  Current Outpatient Medications:  .  amphetamine-dextroamphetamine (ADDERALL) 5 MG tablet, Take 1 tablet (5 mg total) by mouth daily in the afternoon., Disp: 30 tablet, Rfl: 0 .  [START ON 12/19/2019] amphetamine-dextroamphetamine (ADDERALL) 5 MG tablet, Take 1 tablet (5 mg total) by mouth daily., Disp: 30 tablet, Rfl: 0 .  [START ON 11/19/2019] amphetamine-dextroamphetamine (ADDERALL) 5 MG tablet, Take 1 tablet (5 mg total) by mouth daily., Disp: 30 tablet, Rfl: 0 .  lamoTRIgine (LAMICTAL) 200 MG tablet, TAKE 1 TABLET(200 MG) BY MOUTH DAILY, Disp: 90 tablet, Rfl: 1 .  liothyronine (CYTOMEL) 5 MCG tablet, , Disp: , Rfl:  .  [START ON 01/12/2020] lisdexamfetamine (VYVANSE) 20 MG capsule, Take 1 capsule (20 mg total) by mouth daily., Disp: 30 capsule, Rfl: 0 .  [START ON 12/14/2019] lisdexamfetamine (VYVANSE) 20 MG capsule, Take 1 capsule (20 mg total) by mouth daily., Disp: 30 capsule, Rfl: 0 .  [START ON 11/14/2019] lisdexamfetamine (VYVANSE) 20 MG capsule, Take 1 capsule (20 mg total) by mouth daily., Disp: 30 capsule, Rfl: 0 .  PARoxetine (PAXIL) 30 MG tablet, Take 2 tablets (60 mg total) by mouth daily. (Patient taking differently: Take 45 mg by mouth daily. ), Disp: 180 tablet, Rfl: 1 .  QUEtiapine (SEROQUEL) 300 MG tablet, Take 1.5 tablets (450 mg total) by mouth at bedtime., Disp: 135 tablet, Rfl: 1 .  SYNTHROID 112 MCG tablet, TK 1 T PO QAM OES, Disp: , Rfl:  .  traZODone (DESYREL) 100 MG tablet, Take 100-200 mg by mouth  at bedtime as needed for sleep., Disp: , Rfl:  .  [START ON 01/17/2020] amphetamine-dextroamphetamine (ADDERALL) 5 MG tablet, Take 1 tablet (5 mg total) by mouth daily., Disp: 30 tablet, Rfl: 0 .  lisdexamfetamine (VYVANSE) 20 MG capsule, Take 1 capsule (20 mg total) by mouth daily., Disp: 30  capsule, Rfl: 0 Medication Side Effects: none  Family Medical/ Social History: Changes? No  MENTAL HEALTH EXAM:  There were no vitals taken for this visit.There is no height or weight on file to calculate BMI.  General Appearance: Casual, Neat and Well Groomed  Eye Contact:  Good  Speech:  Clear and Coherent and Talkative  Volume:  Normal  Mood:  Euthymic  Affect:  Appropriate  Thought Process:  Goal Directed and Descriptions of Associations: Intact  Orientation:  Full (Time, Place, and Person)  Thought Content: Logical   Suicidal Thoughts:  No  Homicidal Thoughts:  No  Memory:  WNL  Judgement:  Good  Insight:  Good  Psychomotor Activity:  Normal  Concentration:  Concentration: Good and Attention Span: Good  Recall:  Good  Fund of Knowledge: Good  Language: Good  Assets:  Desire for Improvement  ADL's:  Intact  Cognition: WNL  Prognosis:  Good    DIAGNOSES:    ICD-10-CM   1. Bipolar I disorder ([facility])  F31.9   2. Obsessive-compulsive disorder, unspecified type  F42.9   3. Attention deficit hyperactivity disorder (ADHD), predominantly inattentive type  F90.0   4. 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 Insomnia, unspecified type  G47.00     Receiving Psychotherapy: Yes    RECOMMENDATIONS:  PDMP reviewed. I spent 20 minutes with her. Continue Vyvanse 20 mg 1 p.o. every morning. Continue Adderall 5 mg p.o. q. afternoon. Continue Lamictal 1 p.o. daily. Continue Paxil 30 mg, 1.5 pills daily. Continue Seroquel 300 mg, 1.5 pills nightly. Continue trazodone 100 mg, 1-2 p.o. nightly as needed sleep. She has a therapist outside this office and is unable to get an appointment until June or July.  But asks if possible, to see Dr. [name].  She has worked with him in the past with the OCD.  We will ask and let her know. Return in 3 months.  [name], PA-C

## 2019-10-26 ENCOUNTER — Other Ambulatory Visit: Payer: Self-pay | Admitting: Physician Assistant

## 2019-10-26 DIAGNOSIS — M7051 Other bursitis of knee, right knee: Secondary | ICD-10-CM | POA: Diagnosis not present

## 2019-11-14 DIAGNOSIS — L821 Other seborrheic keratosis: Secondary | ICD-10-CM | POA: Diagnosis not present

## 2019-11-14 DIAGNOSIS — D1801 Hemangioma of skin and subcutaneous tissue: Secondary | ICD-10-CM | POA: Diagnosis not present

## 2019-11-14 DIAGNOSIS — L819 Disorder of pigmentation, unspecified: Secondary | ICD-10-CM | POA: Diagnosis not present

## 2019-11-14 DIAGNOSIS — L905 Scar conditions and fibrosis of skin: Secondary | ICD-10-CM | POA: Diagnosis not present

## 2019-11-21 ENCOUNTER — Other Ambulatory Visit: Payer: Self-pay

## 2019-11-21 ENCOUNTER — Ambulatory Visit (INDEPENDENT_AMBULATORY_CARE_PROVIDER_SITE_OTHER): Payer: BC Managed Care – PPO | Admitting: Psychiatry

## 2019-11-21 DIAGNOSIS — F900 Attention-deficit hyperactivity disorder, predominantly inattentive type: Secondary | ICD-10-CM | POA: Diagnosis not present

## 2019-11-21 DIAGNOSIS — F429 Obsessive-compulsive disorder, unspecified: Secondary | ICD-10-CM

## 2019-11-21 DIAGNOSIS — F319 Bipolar disorder, unspecified: Secondary | ICD-10-CM

## 2019-11-21 NOTE — Progress Notes (Signed)
PROBLEM-FOCUSED INITIAL PSYCHOTHERAPY EVALUATION Lindsay Czar, PhD LP [facility]  Name: Lindsay [name] Date: 11/21/2019 Time spent: 55 min MRN: [id number] DOB: 28-Oct-1960 Guardian/Payee: self  PCP: [name], MD (Inactive) Documentation requested on this visit: No  PROBLEM HISTORY Reason for Visit /Presenting Problem:  Chief Complaint  Patient presents with  . Establish Care  . Anxiety    Narrative/History of Present Illness Referred by [name], PA-C for treatment of spiritual/scrupulosity OCD.  PT reports she is also scheduled with a therapist named [name] at the [facility], not till July.  Wanted to get started earlier, with intention to compare approaches and go with whomever she feels more comfortable.  Remembers prior therapy with [place] many years ago, felt she was not settled enough then to make use of it, plus she has difficulties trusting men, but she wanted to challenge that, while checking on Ms. Hurst's recommendation.  Accepted, and agreed to meet and determine afterward what course she wants to take.  Meanwhile, has found a helpful resource in videos from a pastor, [employer], which seems to offer more grace-based interpretation.  Notable problem with intrusive thoughts while reading Bible and thinking she must not love God.  Recognizes she can't see God as loving, and is fundamentally afraid of Him.  Reading Bible passages seems to reinforce the idea that it's all about rules, she is defective, and the promises/hopeful parts of scripture can't possibly apply to her.  Anxious about going to church, can't enjoy it if she does.  Recognizes a long history of perfectionism and "performance Christianity".  Sometimes feels like God is trying to embarrass her.  Knows God is supposed to be good and kind but cannot feel it, always reaches a block trying, gets frustrated.  Has long felt that she is unintelligent and embarrass-able.  Also gets intrusive ideas  that her H Lindsay [name] will be unhappy with her, disappointed enough to leave her, even after 41 yrs.  He has rejection issues, himself, and may get offended if she chooses to do something on her own.  She can get afraid of him for no obvious reason.  Story of family and self-esteem that she had an alcoholic maternal grandfather, who was typically more sedated, checked out.  Father was mentally ill, difficult, and has developed the thought of him that he was "a fucking idiot".  Bipolar, often manic, absent, refused medication, and he was embarrassing to her for his behavior.  He was away a lot, prone to grandiosity, history of affairs.  Mother Lindsay [name], deceased 2 years now) tried twice to be married to him, without success.  Mother wounds include a sense of abandonment for mother marrying a man Lindsay [name], who was jealous of her attention and grumpy.  Mother was afraid to "fail" again and gave it long effort.  About 4th or 5th grade, PT developed stomach pains, threw up on the bus.  Had to sit next to Lindsay [name] at dinner and hated it.  He was unable to pay his own child support, and M covered him.  Mother could be passive-aggressive and quick-tempered, too.  mGM was the secure one and the affirming one, though she put an emphasis on being smart.    Also born prematurely, was in incubator, and realizes she has had an attachment problem and extra fear of abandonment for it.  Did nearly die as an infant as well.  On top of that, school was very difficult for PT.  Attributes to  ADHD and suspects some form of LD as well that cultured a sense of should be able to learn/perform but couldn't.  Figures this bled over into her spiritual OCD.  No claim of mean or degrading teachers, but she felt very inadequate.  Older sister Lindsay [name] special needs, too (intellectually handicapped), firstborn, institutionalized at 59yo.  PT 5 or 59yo, felt guilty that they were abandoning her.  Can feel the same sense of guilt for abandoning leaving her  dog sometimes.  Second eldest brother,   younger sister Lindsay [name] gifted, intelligent, PT felt inferior to [employer], though mother did affirm PT.     Being a Ephriam Knuckles came to look like the way to be OK, significant, worthwhile, which set her up for performance-based self-esteem, perfectionism, and depression.  Bipolar, tends toward winter depressions.  Typically feels guilty about that.  Has had psychotic (manic) episodes, as has daughter.    Prior Psychiatric Assessment/Treatment:   Outpatient treatment: medication management with [name], PA and prior service through this practice Psychiatric hospitalization: Not assessed Psychological assessment/testing: Not assessed   Abuse/neglect screening: Victim of abuse: No.   Victim of neglect: No.   Perpetrator of abuse/neglect: No.   Witness / Exposure to Domestic Violence: No.   Witness to [employer] Violence:  No.   Protective Services Involvement: No.   Report needed: No.    Substance abuse screening: Current substance abuse: Not assessed at this time / none suspected.   History of impactful substance use/abuse: Not assessed at this time / none suspected.     FAMILY/SOCIAL HISTORY Family of origin -- see above Family of intention/current living situation -- husband Lindsay [name].  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   Past Medical History:  Diagnosis Date  . Anxiety   . Bipolar disorder (HCC)   . Delayed  sleep phase syndrome 06/02/2019   Dx [facility] Dr. Theressa Millard  . Depression    OCD  . Hypothyroidism      Past Surgical History:  Procedure Laterality Date  . BLADDER SUSPENSION N/A 06/01/2015   Procedure: TRANSVAGINAL TAPE (TVT) PROCEDURE;  Surgeon: Osborn Coho, MD;  Location: [facility];  Service: Gynecology;  Laterality: N/A;  . cesarean section times two     . CYSTOSCOPY N/A 06/01/2015   Procedure: CYSTOSCOPY;  Surgeon: Osborn Coho, MD;  Location: [facility];  Service: Gynecology;  Laterality: N/A;  . DIAGNOSTIC LAPAROSCOPY    . DILATATION & CURRETTAGE/HYSTEROSCOPY WITH RESECTOCOPE N/A 06/01/2015   Procedure: DILATATION & CURETTAGE/HYSTEROSCOPY WITH RESECTOCOPE;  Surgeon: Osborn Coho, MD;  Location: [facility];  Service: Gynecology;  Laterality: N/A;  . DILATION AND CURETTAGE OF UTERUS    . FRACTURE SURGERY     on right heel   . LAPAROSCOPIC GASTRIC SLEEVE RESECTION      No Known Allergies  Medications (as listed in Epic): Current Outpatient Medications  Medication Sig Dispense Refill  . [START ON 05/10/2020] amphetamine-dextroamphetamine (ADDERALL) 5 MG tablet Take 1 tablet (5 mg total) by mouth daily. 30 tablet 0  . [START ON  04/10/2020] amphetamine-dextroamphetamine (ADDERALL) 5 MG tablet Take 1 tablet (5 mg total) by mouth daily. 30 tablet 0  . amphetamine-dextroamphetamine (ADDERALL) 5 MG tablet Take 1 tablet (5 mg total) by mouth daily. 30 tablet 0  . lamoTRIgine (LAMICTAL) 200 MG tablet TAKE 1 TABLET(200 MG) BY MOUTH DAILY 90 tablet 1  . liothyronine (CYTOMEL) 5 MCG tablet     . [START ON 05/10/2020] lisdexamfetamine (VYVANSE) 20 MG capsule Take 1 capsule (20 mg total) by mouth daily. 30 capsule 0  . [START ON 04/10/2020] lisdexamfetamine (VYVANSE) 20 MG capsule Take 1 capsule (20 mg total) by mouth daily. 30 capsule 0  . lisdexamfetamine (VYVANSE) 20 MG capsule Take 1 capsule (20 mg total) by mouth daily. 30 capsule 0  . PARoxetine (PAXIL) 30 MG tablet TAKE 1&1/2  TABLET BY MOUTH DAILY 135 tablet 1  . QUEtiapine (SEROQUEL) 300 MG tablet TAKE 1 AND 1/2 TABLETS(450 MG) BY MOUTH AT BEDTIME 135 tablet 0  . SYNTHROID 112 MCG tablet TK 1 T PO QAM OES    . traZODone (DESYREL) 100 MG tablet Take 100-200 mg by mouth at bedtime as needed for sleep. (Patient not taking: Reported on 02/16/2020)     No current facility-administered medications for this visit.    MENTAL STATUS AND OBSERVATIONS Appearance:   Neat     Behavior:  Appropriate  Motor:  Normal  Speech/Language:   Clear and Coherent  Affect:  Appropriate and energetic  Mood:  anxious and responsive  Thought process:  normal  Thought content:    WNL  Sensory/Perceptual disturbances:    WNL  Orientation:  Fully oriented  Attention:  Good  Concentration:  Fair  Memory:  WNL  Fund of knowledge:   Fair  Insight:    Fair  Judgment:   Good  Impulse Control:  Fair   Initial Risk Assessment: Danger to self: No Self-injurious behavior: No Danger to others: No Physical aggression / violence: No Duty to warn: No Access to firearms a concern: No Gang involvement: No Patient / guardian was educated about steps to take if suicide or homicide risk level increases between visits: yes . While future psychiatric events cannot be accurately predicted, the patient does not currently require acute inpatient psychiatric care and does not currently meet [facility] involuntary commitment criteria.   DIAGNOSIS:    ICD-10-CM   1. 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 Bipolar I disorder ([place])  F31.9   3. Attention deficit hyperactivity disorder (ADHD), predominantly inattentive type  F90.0     INITIAL TREATMENT: . Support/validation provided for distressing symptoms and confirmed rapport . Ethical orientation and informed consent confirmed re: o privacy rights -- including but not limited to HIPAA, EMR and use of e-PHI o patient responsibilities -- scheduling, fair notice of  changes, in-person vs. telehealth and regulatory and financial conditions affecting choice o expectations for working relationship in psychotherapy o needs and consents for working partnerships and exchange of information with other health care providers, especially any medication and other behavioral health providers . Initial orientation to cognitive-behavioral and solution-focused therapy approach . Psychoeducation and initial recommendations: o Characterized OCD as rooted in developmental susceptibility including genetic predisposition to feel doubt/disgust acutely and have difficulty down regulating it, plus adverse attachment experience and punitive/moralistic upbringing.  Characterized persistent negative self-esteem from conditional/negative absent parenting and experiences of failure relative to siblings and peers and magical childhood thinking that it was her fault that bad things happen, parents fail, etc. but  that her ongoing susceptibility to believing negative thoughts is actually only based on being accustomed to thinking/feeling them, that, with practice and good information, she can better dispute o Noted principles and coping thoughts re. intrusive thoughts: - Just b/c it's loud doesn't mean it's true - Check doubts/condemnation against the character of God o Introduced visualization of supportive figure (mGM) entering the scene after receiving a harsh message from parent (M), offered that imagining supportive persons' input for any negative messages may help mitigate and break down o Faith-based education about interpreting scripture, relying on taking Jesus' example and teachings as the core and illustration of God's benevolent nature.  Noted personal orientation that Jesus chose social and academic failures for first followers.  . Outlook for therapy -- scheduling constraints, availability of crisis service, inclusion of family member(s) as appropriate.  May compare therapists if  still motivated to do so and defer decision with whom to continue  Plan: . Initial challenge to collect evidence of God's kindness as portrayed in (a) scripture, particularly Jesus' example, and (b) people of God she has known . Self-affirm and use interpretations given to combat intrusive guilty thoughts . May compare therapists at her discretion -- says she wants to continue -- but just forthrightly inform whichever therapist is not selected . Maintain medication as prescribed and work faithfully with relevant prescriber(s) if any changes are desired or seem indicated . Call the clinic on-call service, present to ER, or call 911 if any life-threatening psychiatric crisis Return in about 2 weeks (around 12/05/2019) for time as available.  Robley Fries, PhD  Lindsay Czar, PhD LP Clinical Psychologist, [facility] [address] [place], [place] [address] [phone]

## 2019-12-26 ENCOUNTER — Ambulatory Visit: Payer: BC Managed Care – PPO | Admitting: Psychiatry

## 2020-01-05 DIAGNOSIS — F3013 Manic episode, severe, without psychotic symptoms: Secondary | ICD-10-CM | POA: Diagnosis not present

## 2020-01-17 ENCOUNTER — Other Ambulatory Visit: Payer: Self-pay | Admitting: Physician Assistant

## 2020-01-19 DIAGNOSIS — F3013 Manic episode, severe, without psychotic symptoms: Secondary | ICD-10-CM | POA: Diagnosis not present

## 2020-01-20 DIAGNOSIS — L905 Scar conditions and fibrosis of skin: Secondary | ICD-10-CM | POA: Diagnosis not present

## 2020-01-20 DIAGNOSIS — L439 Lichen planus, unspecified: Secondary | ICD-10-CM | POA: Diagnosis not present

## 2020-01-20 DIAGNOSIS — Z85828 Personal history of other malignant neoplasm of skin: Secondary | ICD-10-CM | POA: Diagnosis not present

## 2020-01-20 DIAGNOSIS — L570 Actinic keratosis: Secondary | ICD-10-CM | POA: Diagnosis not present

## 2020-01-20 DIAGNOSIS — D485 Neoplasm of uncertain behavior of skin: Secondary | ICD-10-CM | POA: Diagnosis not present

## 2020-01-24 ENCOUNTER — Ambulatory Visit: Payer: BC Managed Care – PPO | Admitting: Physician Assistant

## 2020-02-02 DIAGNOSIS — F3013 Manic episode, severe, without psychotic symptoms: Secondary | ICD-10-CM | POA: Diagnosis not present

## 2020-02-08 ENCOUNTER — Other Ambulatory Visit: Payer: Self-pay

## 2020-02-08 ENCOUNTER — Telehealth: Payer: Self-pay | Admitting: Physician Assistant

## 2020-02-08 MED ORDER — AMPHETAMINE-DEXTROAMPHETAMINE 5 MG PO TABS: 5.0000 mg | ORAL_TABLET | Freq: Every day | ORAL | 0 refills | Status: DC

## 2020-02-08 MED ORDER — LISDEXAMFETAMINE DIMESYLATE 20 MG PO CAPS: 20.0000 mg | ORAL_CAPSULE | Freq: Every day | ORAL | 0 refills | Status: DC

## 2020-02-08 NOTE — Telephone Encounter (Signed)
Pt called for refill on Vyvanse and Adderall. Send to [facility] on [address]. Next appt is 02/16/20.

## 2020-02-08 NOTE — Telephone Encounter (Signed)
Both Rx's pended for Lindsay [name] to sign and send. Has upcoming apt 02/16/2020 Last refill Vyvanse 01/12/20 Last refill Adderall 01/17/20

## 2020-02-09 ENCOUNTER — Other Ambulatory Visit: Payer: Self-pay | Admitting: Adult Health

## 2020-02-09 DIAGNOSIS — F900 Attention-deficit hyperactivity disorder, predominantly inattentive type: Secondary | ICD-10-CM

## 2020-02-09 MED ORDER — LISDEXAMFETAMINE DIMESYLATE 20 MG PO CAPS: 20.0000 mg | ORAL_CAPSULE | Freq: Every day | ORAL | 0 refills | Status: DC

## 2020-02-09 MED ORDER — AMPHETAMINE-DEXTROAMPHETAMINE 5 MG PO TABS: 5.0000 mg | ORAL_TABLET | Freq: Every day | ORAL | 0 refills | Status: DC

## 2020-02-13 DIAGNOSIS — L570 Actinic keratosis: Secondary | ICD-10-CM | POA: Diagnosis not present

## 2020-02-13 DIAGNOSIS — L819 Disorder of pigmentation, unspecified: Secondary | ICD-10-CM | POA: Diagnosis not present

## 2020-02-13 DIAGNOSIS — L905 Scar conditions and fibrosis of skin: Secondary | ICD-10-CM | POA: Diagnosis not present

## 2020-02-13 DIAGNOSIS — Z85828 Personal history of other malignant neoplasm of skin: Secondary | ICD-10-CM | POA: Diagnosis not present

## 2020-02-15 DIAGNOSIS — Z23 Encounter for immunization: Secondary | ICD-10-CM | POA: Diagnosis not present

## 2020-02-16 ENCOUNTER — Other Ambulatory Visit: Payer: Self-pay

## 2020-02-16 ENCOUNTER — Ambulatory Visit (INDEPENDENT_AMBULATORY_CARE_PROVIDER_SITE_OTHER): Payer: BC Managed Care – PPO | Admitting: Physician Assistant

## 2020-02-16 ENCOUNTER — Encounter: Payer: Self-pay | Admitting: Physician Assistant

## 2020-02-16 DIAGNOSIS — F900 Attention-deficit hyperactivity disorder, predominantly inattentive type: Secondary | ICD-10-CM | POA: Diagnosis not present

## 2020-02-16 DIAGNOSIS — F319 Bipolar disorder, unspecified: Secondary | ICD-10-CM

## 2020-02-16 DIAGNOSIS — F429 Obsessive-compulsive disorder, unspecified: Secondary | ICD-10-CM | POA: Diagnosis not present

## 2020-02-16 DIAGNOSIS — G47419 Narcolepsy without cataplexy: Secondary | ICD-10-CM | POA: Diagnosis not present

## 2020-02-16 MED ORDER — AMPHETAMINE-DEXTROAMPHETAMINE 5 MG PO TABS: 5.0000 mg | ORAL_TABLET | Freq: Every day | ORAL | 0 refills | Status: DC

## 2020-02-16 MED ORDER — LISDEXAMFETAMINE DIMESYLATE 20 MG PO CAPS: 20.0000 mg | ORAL_CAPSULE | Freq: Every day | ORAL | 0 refills | Status: DC

## 2020-02-16 NOTE — Progress Notes (Signed)
[facility]  Patient ID: Lindsay [name],  MRN: [id number]  PCP: [name], MD (Inactive)  Date of Evaluation: 02/16/2020 Time spent:20 minutes  Chief Complaint:  Chief Complaint    Medication Refill      HISTORY/CURRENT STATUS: HPI For routine med check.  Feels like her meds are working well.  She is going through the 12-step program adult child of an alcoholic and dysfunctional families which is very helpful.  She does not work outside the home.  She continues to exercise, walking several miles a day.  Being out in the sun and exercising helps her mental health.  Not having a lot of anxiety.  She sleeps well and does not need the trazodone because the Seroquel is effective for that.  She is not falling asleep during the day since she is on the stimulant.  The Vyvanse and the Adderall are effective for the narcolepsy.  "I would fall asleep at the drop of the hat when I sat down before I was on the stimulant.  It helps my mood too."    OCD symptoms are well controlled right now.  She states they usually get worse in the winter and early spring.  Patient denies loss of interest in usual activities and is able to enjoy things.  Denies decreased energy or motivation.  Appetite has not changed.  No extreme sadness, tearfulness, or feelings of hopelessness.  Denies suicidal or homicidal thoughts.  Patient denies increased energy with decreased need for sleep, no increased talkativeness, no racing thoughts, no impulsivity or risky behaviors, no increased spending, no increased libido, no grandiosity, no increased irritability or anger, no paranoia.  And no hallucinations.  Denies dizziness, syncope, seizures, numbness, tingling, tremor, tics, unsteady gait, slurred speech, confusion. Denies muscle or joint pain, stiffness, or dystonia.Denies unexplained weight loss, frequent infections, or sores that heal slowly.  No polyphagia, polydipsia, or polyuria. Denies visual changes or  paresthesias.   Individual Medical History/ Review of Systems: Changes? :No    Past medications for mental health diagnoses include: Geodon, Lamictal, Latuda, Paxil, trazodone, Ambien, Seroquel, Vraylar, Risperdal, Saphris, Depakote, Fanapt, Abilify, Rexulti, Wellbutrin, Provigil, Nuvigil, Vyvanse, ECT  Allergies: Patient has no known allergies.  Current Medications:  Current Outpatient Medications:    [START ON 05/10/2020] amphetamine-dextroamphetamine (ADDERALL) 5 MG tablet, Take 1 tablet (5 mg total) by mouth daily., Disp: 30 tablet, Rfl: 0   [START ON 04/10/2020] amphetamine-dextroamphetamine (ADDERALL) 5 MG tablet, Take 1 tablet (5 mg total) by mouth daily., Disp: 30 tablet, Rfl: 0   [START ON 03/12/2020] amphetamine-dextroamphetamine (ADDERALL) 5 MG tablet, Take 1 tablet (5 mg total) by mouth daily., Disp: 30 tablet, Rfl: 0   lamoTRIgine (LAMICTAL) 200 MG tablet, TAKE 1 TABLET(200 MG) BY MOUTH DAILY, Disp: 90 tablet, Rfl: 1   liothyronine (CYTOMEL) 5 MCG tablet, , Disp: , Rfl:    [START ON 05/10/2020] lisdexamfetamine (VYVANSE) 20 MG capsule, Take 1 capsule (20 mg total) by mouth daily., Disp: 30 capsule, Rfl: 0   [START ON 04/10/2020] lisdexamfetamine (VYVANSE) 20 MG capsule, Take 1 capsule (20 mg total) by mouth daily., Disp: 30 capsule, Rfl: 0   [START ON 03/12/2020] lisdexamfetamine (VYVANSE) 20 MG capsule, Take 1 capsule (20 mg total) by mouth daily., Disp: 30 capsule, Rfl: 0   PARoxetine (PAXIL) 30 MG tablet, TAKE 1&1/2 TABLET BY MOUTH DAILY, Disp: 135 tablet, Rfl: 1   QUEtiapine (SEROQUEL) 300 MG tablet, TAKE 1 AND 1/2 TABLETS(450 MG) BY MOUTH AT BEDTIME, Disp: 135 tablet, Rfl:  0   SYNTHROID 112 MCG tablet, TK 1 T PO QAM OES, Disp: , Rfl:    traZODone (DESYREL) 100 MG tablet, Take 100-200 mg by mouth at bedtime as needed for sleep. (Patient not taking: Reported on 02/16/2020), Disp: , Rfl:  Medication Side Effects: none  Family Medical/ Social History: Changes?  No  MENTAL HEALTH EXAM:  There were no vitals taken for this visit.There is no height or weight on file to calculate BMI.  General Appearance: Casual, Neat and Well Groomed  Eye Contact:  Good  Speech:  Clear and Coherent and Talkative  Volume:  Normal  Mood:  Euthymic  Affect:  Appropriate  Thought Process:  Goal Directed and Descriptions of Associations: Intact  Orientation:  Full (Time, Place, and Person)  Thought Content: Logical   Suicidal Thoughts:  No  Homicidal Thoughts:  No  Memory:  WNL  Judgement:  Good  Insight:  Good  Psychomotor Activity:  Normal  Concentration:  Concentration: Good and Attention Span: Good  Recall:  Good  Fund of Knowledge: Good  Language: Good  Assets:  Desire for Improvement  ADL's:  Intact  Cognition: WNL  Prognosis:  Good    DIAGNOSES:    ICD-10-CM   1. 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 Attention deficit hyperactivity disorder (ADHD), predominantly inattentive type  F90.0 lisdexamfetamine (VYVANSE) 20 MG capsule  3. Bipolar I disorder (HCC)  F31.9   4. 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 I provided 20 mins of face to face time during this encounter.  I'm glad she's doing well.  Continue Vyvanse 20 mg 1 p.o. every morning. Continue Adderall 5 mg p.o. q. afternoon. Continue Lamictal 1 p.o. daily. Continue Paxil 30 mg, 1.5 pills daily. Continue Seroquel 300 mg, 1.5 pills nightly. Continue trazodone 100 mg, 1-2 p.o. nightly as needed sleep.Not using now. Continue counseling.  Needs labs at next OV. Return in 6 weeks.  [name], PA-C

## 2020-03-15 DIAGNOSIS — F3013 Manic episode, severe, without psychotic symptoms: Secondary | ICD-10-CM | POA: Diagnosis not present

## 2020-03-28 ENCOUNTER — Ambulatory Visit (INDEPENDENT_AMBULATORY_CARE_PROVIDER_SITE_OTHER): Payer: BC Managed Care – PPO | Admitting: Physician Assistant

## 2020-03-28 ENCOUNTER — Encounter: Payer: Self-pay | Admitting: Physician Assistant

## 2020-03-28 ENCOUNTER — Other Ambulatory Visit: Payer: Self-pay

## 2020-03-28 DIAGNOSIS — F319 Bipolar disorder, unspecified: Secondary | ICD-10-CM | POA: Diagnosis not present

## 2020-03-28 DIAGNOSIS — F429 Obsessive-compulsive disorder, unspecified: Secondary | ICD-10-CM

## 2020-03-28 DIAGNOSIS — G47419 Narcolepsy without cataplexy: Secondary | ICD-10-CM

## 2020-03-28 MED ORDER — AMPHETAMINE-DEXTROAMPHETAMINE 10 MG PO TABS: 5.0000 mg | ORAL_TABLET | Freq: Every day | ORAL | 0 refills | Status: DC

## 2020-03-28 NOTE — Progress Notes (Signed)
[facility]  Patient ID: Lindsay [name],  MRN: [id number]  PCP: [name], MD (Inactive)  Date of Evaluation: 03/28/2020 Time spent:20 minutes  Chief Complaint:  Chief Complaint    Follow-up      HISTORY/CURRENT STATUS: For routine med check.  Only problem is feeling kind of hung over in the mornings from the Seroquel. She'll get up and have her coffee and fall back to sleep for maybe 45 mins or so. The Vyvanse finally kicks in and lasts about 4-6 hours. If she doesn't take it, she can fall asleep sitting still for only a few minutes. The Adderall in the afternoon seems weak now or something. Doesn't last long enough.  OCD symptoms are well controlled right now.  Patient denies loss of interest in usual activities and is able to enjoy things. Walks daily for exercise.  Denies decreased energy or motivation.  Appetite has not changed.  No extreme sadness, tearfulness, or feelings of hopelessness.  Denies suicidal or homicidal thoughts.  Patient denies increased energy with decreased need for sleep, no increased talkativeness, no racing thoughts, no impulsivity or risky behaviors, no increased spending, no increased libido, no grandiosity, no increased irritability or anger, no paranoia.  And no hallucinations.  Denies dizziness, syncope, seizures, numbness, tingling, tremor, tics, unsteady gait, slurred speech, confusion. Denies muscle or joint pain, stiffness, or dystonia.Denies unexplained weight loss, frequent infections, or sores that heal slowly.  No polyphagia, polydipsia, or polyuria. Denies visual changes or paresthesias.   Individual Medical History/ Review of Systems: Changes? :No    Past medications for mental health diagnoses include: Geodon, Lamictal, Latuda, Paxil, trazodone, Ambien, Seroquel, Vraylar, Risperdal, Saphris, Depakote, Fanapt, Abilify, Rexulti, Wellbutrin, Provigil, Nuvigil, Vyvanse, ECT  Allergies: Patient has no known allergies.  Current  Medications:  Current Outpatient Medications:  .  lamoTRIgine (LAMICTAL) 200 MG tablet, TAKE 1 TABLET(200 MG) BY MOUTH DAILY, Disp: 90 tablet, Rfl: 1 .  liothyronine (CYTOMEL) 5 MCG tablet, , Disp: , Rfl:  .  [START ON 05/10/2020] lisdexamfetamine (VYVANSE) 20 MG capsule, Take 1 capsule (20 mg total) by mouth daily., Disp: 30 capsule, Rfl: 0 .  [START ON 04/10/2020] lisdexamfetamine (VYVANSE) 20 MG capsule, Take 1 capsule (20 mg total) by mouth daily., Disp: 30 capsule, Rfl: 0 .  lisdexamfetamine (VYVANSE) 20 MG capsule, Take 1 capsule (20 mg total) by mouth daily., Disp: 30 capsule, Rfl: 0 .  PARoxetine (PAXIL) 30 MG tablet, TAKE 1&1/2 TABLET BY MOUTH DAILY, Disp: 135 tablet, Rfl: 1 .  QUEtiapine (SEROQUEL) 300 MG tablet, TAKE 1 AND 1/2 TABLETS(450 MG) BY MOUTH AT BEDTIME, Disp: 135 tablet, Rfl: 0 .  SYNTHROID 112 MCG tablet, TK 1 T PO QAM OES, Disp: , Rfl:  .  amphetamine-dextroamphetamine (ADDERALL) 10 MG tablet, Take 0.5-1 tablets (5-10 mg total) by mouth daily with lunch., Disp: 30 tablet, Rfl: 0 .  traZODone (DESYREL) 100 MG tablet, Take 100-200 mg by mouth at bedtime as needed for sleep. (Patient not taking: Reported on 02/16/2020), Disp: , Rfl:  Medication Side Effects: none  Family Medical/ Social History: Changes? No  MENTAL HEALTH EXAM:  There were no vitals taken for this visit.There is no height or weight on file to calculate BMI.  General Appearance: Casual, Neat and Well Groomed  Eye Contact:  Good  Speech:  Clear and Coherent and Talkative  Volume:  Normal  Mood:  Euthymic  Affect:  Appropriate  Thought Process:  Goal Directed and Descriptions of Associations: Intact  Orientation:  Full (  Time, Place, and Person)  Thought Content: Logical   Suicidal Thoughts:  No  Homicidal Thoughts:  No  Memory:  WNL  Judgement:  Good  Insight:  Good  Psychomotor Activity:  Normal  Concentration:  Concentration: Good and Attention Span: Good  Recall:  Good  Fund of Knowledge: Good   Language: Good  Assets:  Desire for Improvement  ADL's:  Intact  Cognition: WNL  Prognosis:  Good    DIAGNOSES:    ICD-10-CM   1. 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 Bipolar I disorder (HCC)  F31.9   3. 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 I provided 20 mins of face to face time during this encounter.  I'm glad she's doing well but increasing the Adderall dose can be very helpful. Of course we'll watch closely to make sure she doesn't become manic.  She knows to call if she or husband notice any signs of beginning stages of mania. She can cont the 5mg  and only go up if she starts having more 'down' times in the afternoon, as the days get shorter. Continue Vyvanse 20 mg 1 p.o. every morning. Increase Adderall to 10 mg p.o. q. afternoon. Continue Lamictal 200 mg, 1 p.o. daily. Continue Paxil 30 mg, 1.5 pills daily. Continue Seroquel 300 mg, 1.5 pills nightly. Continue trazodone 100 mg, 1-2 p.o. nightly as needed sleep.Not using now. Continue counseling.  Return in 4 weeks.  , PA-C

## 2020-03-29 DIAGNOSIS — F3013 Manic episode, severe, without psychotic symptoms: Secondary | ICD-10-CM | POA: Diagnosis not present

## 2020-03-30 DIAGNOSIS — F3341 Major depressive disorder, recurrent, in partial remission: Secondary | ICD-10-CM | POA: Diagnosis not present

## 2020-04-10 ENCOUNTER — Other Ambulatory Visit: Payer: Self-pay | Admitting: Physician Assistant

## 2020-04-10 NOTE — Telephone Encounter (Signed)
review 

## 2020-04-12 DIAGNOSIS — F3013 Manic episode, severe, without psychotic symptoms: Secondary | ICD-10-CM | POA: Diagnosis not present

## 2020-04-13 DIAGNOSIS — F3341 Major depressive disorder, recurrent, in partial remission: Secondary | ICD-10-CM | POA: Diagnosis not present

## 2020-04-17 DIAGNOSIS — F3341 Major depressive disorder, recurrent, in partial remission: Secondary | ICD-10-CM | POA: Diagnosis not present

## 2020-04-23 DIAGNOSIS — E039 Hypothyroidism, unspecified: Secondary | ICD-10-CM | POA: Diagnosis not present

## 2020-04-30 DIAGNOSIS — E039 Hypothyroidism, unspecified: Secondary | ICD-10-CM | POA: Diagnosis not present

## 2020-04-30 DIAGNOSIS — E049 Nontoxic goiter, unspecified: Secondary | ICD-10-CM | POA: Diagnosis not present

## 2020-05-03 ENCOUNTER — Other Ambulatory Visit: Payer: Self-pay

## 2020-05-03 ENCOUNTER — Ambulatory Visit (INDEPENDENT_AMBULATORY_CARE_PROVIDER_SITE_OTHER): Payer: BC Managed Care – PPO | Admitting: Physician Assistant

## 2020-05-03 ENCOUNTER — Encounter: Payer: Self-pay | Admitting: Physician Assistant

## 2020-05-03 DIAGNOSIS — F900 Attention-deficit hyperactivity disorder, predominantly inattentive type: Secondary | ICD-10-CM | POA: Diagnosis not present

## 2020-05-03 DIAGNOSIS — F319 Bipolar disorder, unspecified: Secondary | ICD-10-CM | POA: Diagnosis not present

## 2020-05-03 DIAGNOSIS — F429 Obsessive-compulsive disorder, unspecified: Secondary | ICD-10-CM | POA: Diagnosis not present

## 2020-05-03 DIAGNOSIS — G47419 Narcolepsy without cataplexy: Secondary | ICD-10-CM | POA: Diagnosis not present

## 2020-05-03 MED ORDER — PAROXETINE HCL 30 MG PO TABS: ORAL_TABLET | ORAL | 3 refills | Status: DC

## 2020-05-03 MED ORDER — LAMOTRIGINE 200 MG PO TABS: ORAL_TABLET | ORAL | 3 refills | Status: DC

## 2020-05-03 MED ORDER — AMPHETAMINE-DEXTROAMPHETAMINE 10 MG PO TABS: 10.0000 mg | ORAL_TABLET | Freq: Every day | ORAL | 0 refills | Status: DC

## 2020-05-03 NOTE — Progress Notes (Signed)
[facility]  Patient ID: Lindsay [name],  MRN: [id number]  PCP: [name], MD (Inactive)  Date of Evaluation: 05/03/2020 Time spent:20 minutes  Chief Complaint:  Chief Complaint    Follow-up      HISTORY/CURRENT STATUS: For routine med check.  States she's doing well. She is able to enjoy things. Walks daily for exercise.  Denies decreased energy or motivation.  Appetite has not changed.  No extreme sadness, tearfulness, or feelings of hopelessness.  Denies suicidal or homicidal thoughts.  Increasing the Adderall dose last time has been helpful. It had been wearing off quickly and she would get extremely sleepy in the late afternoon, to the point she wasn't able to do anything. Better now.   OCD is well-controlled. "I have my days where I can't get the thoughts out of head, but most days, I'm good." Anxiety overall isn't a problem.   Patient denies increased energy with decreased need for sleep, no increased talkativeness, no racing thoughts, no impulsivity or risky behaviors, no increased spending, no increased libido, no grandiosity, no increased irritability or anger, no paranoia.  And no hallucinations.  Denies dizziness, syncope, seizures, numbness, tingling, tremor, tics, unsteady gait, slurred speech, confusion. Denies muscle or joint pain, stiffness, or dystonia.Denies unexplained weight loss, frequent infections, or sores that heal slowly.  No polyphagia, polydipsia, or polyuria. Denies visual changes or paresthesias.   Individual Medical History/ Review of Systems: Changes? :No    Past medications for mental health diagnoses include: Geodon, Lamictal, Latuda, Paxil, trazodone, Ambien, Seroquel, Vraylar, Risperdal, Saphris, Depakote, Fanapt, Abilify, Rexulti, Wellbutrin, Provigil, Nuvigil, Vyvanse, ECT  Allergies: Patient has no known allergies.  Current Medications:  Current Outpatient Medications:    amphetamine-dextroamphetamine (ADDERALL) 10 MG  tablet, Take 0.5-1 tablets (5-10 mg total) by mouth daily with lunch., Disp: 30 tablet, Rfl: 0   lamoTRIgine (LAMICTAL) 200 MG tablet, TAKE 1 TABLET(200 MG) BY MOUTH DAILY, Disp: 90 tablet, Rfl: 3   liothyronine (CYTOMEL) 5 MCG tablet, , Disp: , Rfl:    [START ON 05/10/2020] lisdexamfetamine (VYVANSE) 20 MG capsule, Take 1 capsule (20 mg total) by mouth daily., Disp: 30 capsule, Rfl: 0   lisdexamfetamine (VYVANSE) 20 MG capsule, Take 1 capsule (20 mg total) by mouth daily., Disp: 30 capsule, Rfl: 0   lisdexamfetamine (VYVANSE) 20 MG capsule, Take 1 capsule (20 mg total) by mouth daily., Disp: 30 capsule, Rfl: 0   PARoxetine (PAXIL) 30 MG tablet, TAKE 1&1/2 TABLET BY MOUTH DAILY, Disp: 135 tablet, Rfl: 3   QUEtiapine (SEROQUEL) 300 MG tablet, TAKE 1 AND 1/2 TABLETS(450 MG) BY MOUTH AT BEDTIME, Disp: 135 tablet, Rfl: 0   SYNTHROID 112 MCG tablet, TK 1 T PO QAM OES, Disp: , Rfl:    traZODone (DESYREL) 100 MG tablet, Take 100-200 mg by mouth at bedtime as needed for sleep. , Disp: , Rfl:    [START ON 05/10/2020] amphetamine-dextroamphetamine (ADDERALL) 10 MG tablet, Take 1 tablet (10 mg total) by mouth daily with lunch., Disp: 30 tablet, Rfl: 0 Medication Side Effects: none  Family Medical/ Social History: Changes? No  MENTAL HEALTH EXAM:  There were no vitals taken for this visit.There is no height or weight on file to calculate BMI.  General Appearance: Casual, Neat and Well Groomed  Eye Contact:  Good  Speech:  Clear and Coherent and Talkative  Volume:  Normal  Mood:  Euthymic  Affect:  Appropriate  Thought Process:  Goal Directed and Descriptions of Associations: Intact  Orientation:  Full (Time,  Place, and Person)  Thought Content: Logical   Suicidal Thoughts:  No  Homicidal Thoughts:  No  Memory:  WNL  Judgement:  Good  Insight:  Good  Psychomotor Activity:  Normal  Concentration:  Concentration: Good and Attention Span: Good  Recall:  Good  Fund of Knowledge: Good   Language: Good  Assets:  Desire for Improvement  ADL's:  Intact  Cognition: WNL  Prognosis:  Good    DIAGNOSES:    ICD-10-CM   1. Bipolar I disorder (HCC)  F31.9   2. 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 Attention deficit hyperactivity disorder (ADHD), predominantly inattentive type  F90.0     Receiving Psychotherapy: Yes    RECOMMENDATIONS:  PDMP reviewed. I provided 20 mins of face to face time during this encounter.  She's doing well on current meds.  No changes will be made. She tends to get more depressed in winter so we'll watch her closely. Continue Vyvanse 20 mg 1 p.o. every morning. Continue Adderall 10 mg p.o. q. afternoon. Continue Lamictal 200 mg, 1 p.o. daily. Continue Paxil 30 mg, 1.5 pills daily. Continue Seroquel 300 mg, 1.5 pills nightly. Continue trazodone 100 mg, 1-2 p.o. nightly as needed sleep.Not using now. Continue counseling.  Need to discuss labs at next OV.  Return in 4 weeks.  [name], PA-C

## 2020-05-04 ENCOUNTER — Telehealth: Payer: Self-pay | Admitting: Physician Assistant

## 2020-05-04 NOTE — Telephone Encounter (Signed)
Pt called to report refills for Vyvanse & Adderall is due 11/11. Pt has only 4 pills left. She will be 2 days short. Contact @ [phone]

## 2020-05-07 NOTE — Telephone Encounter (Signed)
Does she mean 4 pills of each?  If so, that we will get her to the 11th, right?  If she does only have 2 days worth of both meds, please call her pharmacy and okay to fell on Wednesday which is the 10th.

## 2020-05-07 NOTE — Telephone Encounter (Signed)
Patient has 2 pills left on Vyvanse, her Adderall she is okay.   Tried to reach pharmacist to okay early refill, unable to speak to anyone. Will call back in the morning.

## 2020-05-07 NOTE — Telephone Encounter (Signed)
Last refill for Adderall 10 mg on 10/13 Last refill for Vyvanse 10/12   Rx's dated fill on 05/10/2020

## 2020-05-09 DIAGNOSIS — Z23 Encounter for immunization: Secondary | ICD-10-CM | POA: Diagnosis not present

## 2020-05-09 NOTE — Telephone Encounter (Signed)
Finally able to get through to pharmacy and okayed early refill today. They noted it and said they would fill it.

## 2020-05-15 ENCOUNTER — Telehealth: Payer: Self-pay | Admitting: Physician Assistant

## 2020-05-15 NOTE — Telephone Encounter (Signed)
Pt called to report she is currently taking Vyvanse 20 mg and will need to  Increase this soon, due to winter. Also, taking Adderall 10 mg. Pt needs to talk to Traci to explain she has started taking 15 mg and is working well for her. Contact # [phone]. Apt 12/16

## 2020-05-16 NOTE — Telephone Encounter (Signed)
Left message to call back  

## 2020-05-16 NOTE — Telephone Encounter (Signed)
Rtc to patient and she is requesting to change to Adderall 15 mg in the am and 15 mg around 1 pm. She feels like the Vyvanse doesn't do a lot. Has a friend whom is another patient her at practice and sees Dr. Jennelle Human whom takes that dose. She doesn't feel it will interfere with sleep or cause mania. She feels it will help her stay more focused and get more done.

## 2020-05-17 DIAGNOSIS — F3341 Major depressive disorder, recurrent, in partial remission: Secondary | ICD-10-CM | POA: Diagnosis not present

## 2020-05-20 DIAGNOSIS — M7918 Myalgia, other site: Secondary | ICD-10-CM | POA: Diagnosis not present

## 2020-05-21 NOTE — Telephone Encounter (Signed)
Left message with information and to call back with her pharmacy. 

## 2020-05-21 NOTE — Telephone Encounter (Signed)
Please give her a call and apologize for me that it is taken this long to get back with her.  I wanted to discuss this with Dr. Jennelle Human before I made any changes, just because of what happened last year.  His recommendation is that as long as she is taking the Seroquel as directed, it is fine to try the Adderall 15 mg in the morning and 1 around lunch.  Let me know which pharmacy and I will send it in.

## 2020-05-22 ENCOUNTER — Other Ambulatory Visit: Payer: Self-pay | Admitting: Physician Assistant

## 2020-05-22 MED ORDER — AMPHETAMINE-DEXTROAMPHETAMINE 15 MG PO TABS: 15.0000 mg | ORAL_TABLET | Freq: Two times a day (BID) | ORAL | 0 refills | Status: DC

## 2020-05-22 NOTE — Telephone Encounter (Signed)
Prescription was sent

## 2020-05-22 NOTE — Telephone Encounter (Signed)
Patient called back and uses [facility] on [address] [place] and [address]

## 2020-05-23 DIAGNOSIS — M542 Cervicalgia: Secondary | ICD-10-CM | POA: Diagnosis not present

## 2020-05-23 DIAGNOSIS — M9901 Segmental and somatic dysfunction of cervical region: Secondary | ICD-10-CM | POA: Diagnosis not present

## 2020-05-23 DIAGNOSIS — M5441 Lumbago with sciatica, right side: Secondary | ICD-10-CM | POA: Diagnosis not present

## 2020-05-23 DIAGNOSIS — M5417 Radiculopathy, lumbosacral region: Secondary | ICD-10-CM | POA: Diagnosis not present

## 2020-05-23 DIAGNOSIS — M9903 Segmental and somatic dysfunction of lumbar region: Secondary | ICD-10-CM | POA: Diagnosis not present

## 2020-05-29 DIAGNOSIS — M5136 Other intervertebral disc degeneration, lumbar region: Secondary | ICD-10-CM | POA: Diagnosis not present

## 2020-05-29 DIAGNOSIS — M5116 Intervertebral disc disorders with radiculopathy, lumbar region: Secondary | ICD-10-CM | POA: Diagnosis not present

## 2020-05-29 DIAGNOSIS — F3341 Major depressive disorder, recurrent, in partial remission: Secondary | ICD-10-CM | POA: Diagnosis not present

## 2020-05-31 ENCOUNTER — Emergency Department (HOSPITAL_BASED_OUTPATIENT_CLINIC_OR_DEPARTMENT_OTHER)
Admission: EM | Admit: 2020-05-31 | Discharge: 2020-05-31 | Disposition: A | Discharge status: Home / Self Care | Payer: BC Managed Care – PPO | Attending: Emergency Medicine | Admitting: Emergency Medicine

## 2020-05-31 ENCOUNTER — Encounter (HOSPITAL_BASED_OUTPATIENT_CLINIC_OR_DEPARTMENT_OTHER): Payer: Self-pay

## 2020-05-31 ENCOUNTER — Emergency Department (HOSPITAL_BASED_OUTPATIENT_CLINIC_OR_DEPARTMENT_OTHER): Payer: BC Managed Care – PPO

## 2020-05-31 ENCOUNTER — Other Ambulatory Visit: Payer: Self-pay

## 2020-05-31 DIAGNOSIS — S61213A Laceration without foreign body of left middle finger without damage to nail, initial encounter: Secondary | ICD-10-CM

## 2020-05-31 DIAGNOSIS — W293XXA Contact with powered garden and outdoor hand tools and machinery, initial encounter: Secondary | ICD-10-CM | POA: Diagnosis not present

## 2020-05-31 DIAGNOSIS — Z79899 Other long term (current) drug therapy: Secondary | ICD-10-CM | POA: Diagnosis not present

## 2020-05-31 DIAGNOSIS — E039 Hypothyroidism, unspecified: Secondary | ICD-10-CM | POA: Insufficient documentation

## 2020-05-31 MED ORDER — LIDOCAINE HCL (PF) 1 % IJ SOLN
5.0000 mL | Freq: Once | INTRAMUSCULAR | Status: DC
  Filled 2020-05-31: qty 5

## 2020-05-31 NOTE — ED Provider Notes (Signed)
[facility] EMERGENCY DEPARTMENT Provider Note   CSN: [id number] Arrival date & time: 05/31/20  0844     History Chief Complaint  Patient presents with  . Finger Injury    Lindsay [name] is a 59 y.o. female presenting to the emergency department with complaint of laceration to left third digit that occurred yesterday afternoon around 3:30 PM. She was trimming her hedges with an electric hedge trimmer and lacerated her finger. She cleaned and dressed her wound with antibiotic ointment. No difficulty moving her digit. She presents today for evaluation. No history of immunocompromise.  The history is provided by the patient.       Past Medical History:  Diagnosis Date  . Anxiety   . Bipolar disorder (HCC)   . 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 Theressa Millard  . Depression    OCD  . Hypothyroidism     Patient Active Problem List   Diagnosis Date Noted  . 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  . 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 Encounter for counseling 06/01/2019  . Hypothyroidism, acquired 12/22/2018  . DOE (dyspnea on exertion) 12/21/2018  . OCD (obsessive compulsive disorder) 05/18/2018  . Bipolar disorder (HCC) 05/18/2018  . Stress incontinence in female 06/01/2015    Past Surgical History:  Procedure Laterality Date  . BLADDER SUSPENSION N/A 06/01/2015   Procedure: TRANSVAGINAL TAPE (TVT) PROCEDURE;  Surgeon: Osborn Coho, MD;  Location: [facility];  Service: Gynecology;  Laterality: N/A;  . cesarean section times two     . CYSTOSCOPY N/A 06/01/2015   Procedure: CYSTOSCOPY;  Surgeon: Osborn Coho, MD;  Location: [facility];  Service: Gynecology;  Laterality: N/A;  . DIAGNOSTIC LAPAROSCOPY    . DILATATION & CURRETTAGE/HYSTEROSCOPY WITH RESECTOCOPE N/A 06/01/2015   Procedure: DILATATION & CURETTAGE/HYSTEROSCOPY WITH RESECTOCOPE;  Surgeon: Osborn Coho, MD;  Location: [facility];  Service: Gynecology;  Laterality: N/A;  . DILATION AND  CURETTAGE OF UTERUS    . FRACTURE SURGERY     on right heel   . LAPAROSCOPIC GASTRIC SLEEVE RESECTION       OB History   No obstetric history on file.     History reviewed. No pertinent family history.  Social History   Tobacco Use  . Smoking status: Never Smoker  . Smokeless tobacco: Never Used  Substance Use Topics  . Alcohol use: Yes    Alcohol/week: 1.0 standard drink    Types: 1 Glasses of wine per week    Comment: occ  . Drug use: No    Home Medications Prior to Admission medications   Medication Sig Start Date End Date Taking? Authorizing Provider  amphetamine-dextroamphetamine (ADDERALL) 15 MG tablet Take 1 tablet by mouth 2 (two) times daily with breakfast and lunch. 05/22/20   [name] T, PA-C  lamoTRIgine (LAMICTAL) 200 MG tablet TAKE 1 TABLET(200 MG) BY MOUTH DAILY 05/03/20   [name] T, PA-C  liothyronine (CYTOMEL) 5 MCG tablet  12/27/18   [provider]  PARoxetine (PAXIL) 30 MG tablet TAKE 1&1/2 TABLET BY MOUTH DAILY 05/03/20   [name] T, PA-C  QUEtiapine (SEROQUEL) 300 MG tablet TAKE 1 AND 1/2 TABLETS(450 MG) BY MOUTH AT BEDTIME 04/10/20   Hurst, Teresa T, PA-C  SYNTHROID 112 MCG tablet TK 1 T PO QAM OES 12/24/18   [provider]  traZODone (DESYREL) 100 MG tablet Take 100-200 mg by mouth at bedtime as needed for sleep.     [provider]  Allergies    Patient has no known allergies.  Review of Systems   Review of Systems  Musculoskeletal: Positive for myalgias.  Skin: Positive for wound.    Physical Exam Updated Vital Signs BP 125/79 (BP Location: Right Arm)   Pulse (!) 53   Temp 97.6 F (36.4 C) (Oral)   Resp 18   Ht 5\' 6"  (1.676 m)   Wt 86.2 kg   SpO2 100%   BMI 30.67 kg/m   Physical Exam Vitals and nursing note reviewed.  Constitutional:      General: She is not in acute distress.    Appearance: She is well-developed.  HENT:     Head: Normocephalic and atraumatic.  Eyes:      Conjunctiva/sclera: Conjunctivae normal.  Cardiovascular:     Rate and Rhythm: Normal rate.  Pulmonary:     Effort: Pulmonary effort is normal.  Musculoskeletal:     Comments: Pad of left third distal digit with jagged incomplete avulsion laceration, about 1.5cm in total length. No drainage, not grossly contaminated. There is associated tenderness and mild swelling of the digit. No redness. No tendons visualized at the base of the wound. Patient has strong active flexion of the DIP joint.  Neurological:     Mental Status: She is alert.  Psychiatric:        Mood and Affect: Mood normal.        Behavior: Behavior normal.     ED Results / Procedures / Treatments   Labs (all labs ordered are listed, but only abnormal results are displayed) Labs Reviewed - No data to display  EKG None  Radiology DG Finger Middle Left  Result Date: 05/31/2020 CLINICAL DATA:  Left middle finger laceration. EXAM: LEFT MIDDLE FINGER 2+V COMPARISON:  None. FINDINGS: There is no evidence of fracture or dislocation. There is no evidence of arthropathy or other focal bone abnormality. Soft tissues are unremarkable. IMPRESSION: Negative. Electronically Signed   By: 14/08/2019 M.D.   On: 05/31/2020 09:43    Procedures Procedures (including critical care time)  Medications Ordered in ED Medications - No data to display  ED Course  I have reviewed the triage vital signs and the nursing notes.  Pertinent labs & imaging results that were available during my care of the patient were reviewed by me and considered in my medical decision making (see chart for details).    MDM Rules/Calculators/A&P                          Patient with laceration to left third digit from an electric hedge trimmer that occurred yesterday afternoon around 3:30 PM. Tetanus was updated within the last 1 to 2 years. Laceration appears to be a jagged incomplete rather superficial avulsion laceration to the pad of the distal  phalanx. Do not recommend any closure of the wound due to duration since occurrence. No obvious tendon injury, strong flexion against resistance of the DIP joint. Wound is not grossly contaminated. X-ray is negative for retained foreign body. Wound is irrigated and dressed with nonadherent gauze. Discussed wound care and signs of infection which would warrant return. No hx of immunocompromise, abx not indicated at this time. PCP follow up early next week. Patient is appropriate for discharge.  Discussed results, findings, treatment and follow up. Patient advised of return precautions. Patient verbalized understanding and agreed with plan.  Final Clinical Impression(s) / ED Diagnoses Final diagnoses:  Laceration of left middle finger  without foreign body without damage to nail, initial encounter    Rx / DC Orders ED Discharge Orders    None       Niani Mourer, [place] N, PA-C 05/31/20 1049    Arby Barrette, MD 06/08/20 1541

## 2020-05-31 NOTE — Discharge Instructions (Signed)
Please read instructions below.  Keep your wound clean and covered. Gently clean it with soap and water daily, pat it dry, and reapply a clean bandage. You can take tylenol every 4-6 hours as needed for pain. Apply ice for 20 minutes at a time, elevate it to help with swelling Follow up with your primary care or urgent care for wound recheck in 3-5 days.  Return to the ER for fever, pus draining from wound, redness, or new or worsening symptoms.

## 2020-05-31 NOTE — ED Triage Notes (Signed)
Pt states she cut her middle finger on her L hand yesterday afternoon with an Publishing rights manager.

## 2020-06-01 DIAGNOSIS — M5136 Other intervertebral disc degeneration, lumbar region: Secondary | ICD-10-CM | POA: Diagnosis not present

## 2020-06-01 DIAGNOSIS — M5416 Radiculopathy, lumbar region: Secondary | ICD-10-CM | POA: Diagnosis not present

## 2020-06-05 DIAGNOSIS — M5416 Radiculopathy, lumbar region: Secondary | ICD-10-CM | POA: Diagnosis not present

## 2020-06-05 DIAGNOSIS — M5136 Other intervertebral disc degeneration, lumbar region: Secondary | ICD-10-CM | POA: Diagnosis not present

## 2020-06-06 DIAGNOSIS — M9903 Segmental and somatic dysfunction of lumbar region: Secondary | ICD-10-CM | POA: Diagnosis not present

## 2020-06-06 DIAGNOSIS — M5417 Radiculopathy, lumbosacral region: Secondary | ICD-10-CM | POA: Diagnosis not present

## 2020-06-06 DIAGNOSIS — M9901 Segmental and somatic dysfunction of cervical region: Secondary | ICD-10-CM | POA: Diagnosis not present

## 2020-06-06 DIAGNOSIS — M542 Cervicalgia: Secondary | ICD-10-CM | POA: Diagnosis not present

## 2020-06-08 DIAGNOSIS — F3341 Major depressive disorder, recurrent, in partial remission: Secondary | ICD-10-CM | POA: Diagnosis not present

## 2020-06-08 DIAGNOSIS — M5416 Radiculopathy, lumbar region: Secondary | ICD-10-CM | POA: Diagnosis not present

## 2020-06-08 DIAGNOSIS — M5136 Other intervertebral disc degeneration, lumbar region: Secondary | ICD-10-CM | POA: Diagnosis not present

## 2020-06-12 DIAGNOSIS — M5136 Other intervertebral disc degeneration, lumbar region: Secondary | ICD-10-CM | POA: Diagnosis not present

## 2020-06-12 DIAGNOSIS — M5416 Radiculopathy, lumbar region: Secondary | ICD-10-CM | POA: Diagnosis not present

## 2020-06-13 DIAGNOSIS — M9903 Segmental and somatic dysfunction of lumbar region: Secondary | ICD-10-CM | POA: Diagnosis not present

## 2020-06-13 DIAGNOSIS — M5417 Radiculopathy, lumbosacral region: Secondary | ICD-10-CM | POA: Diagnosis not present

## 2020-06-13 DIAGNOSIS — M542 Cervicalgia: Secondary | ICD-10-CM | POA: Diagnosis not present

## 2020-06-13 DIAGNOSIS — M9901 Segmental and somatic dysfunction of cervical region: Secondary | ICD-10-CM | POA: Diagnosis not present

## 2020-06-13 IMAGING — DX CHEST - 2 VIEW
2 series · 2 of 2 positions shown · non-contrast
Comparison: None.

CLINICAL DATA: Order for TARLA  Hx cough

EXAM:
CHEST - 2 VIEW

[chest pa]
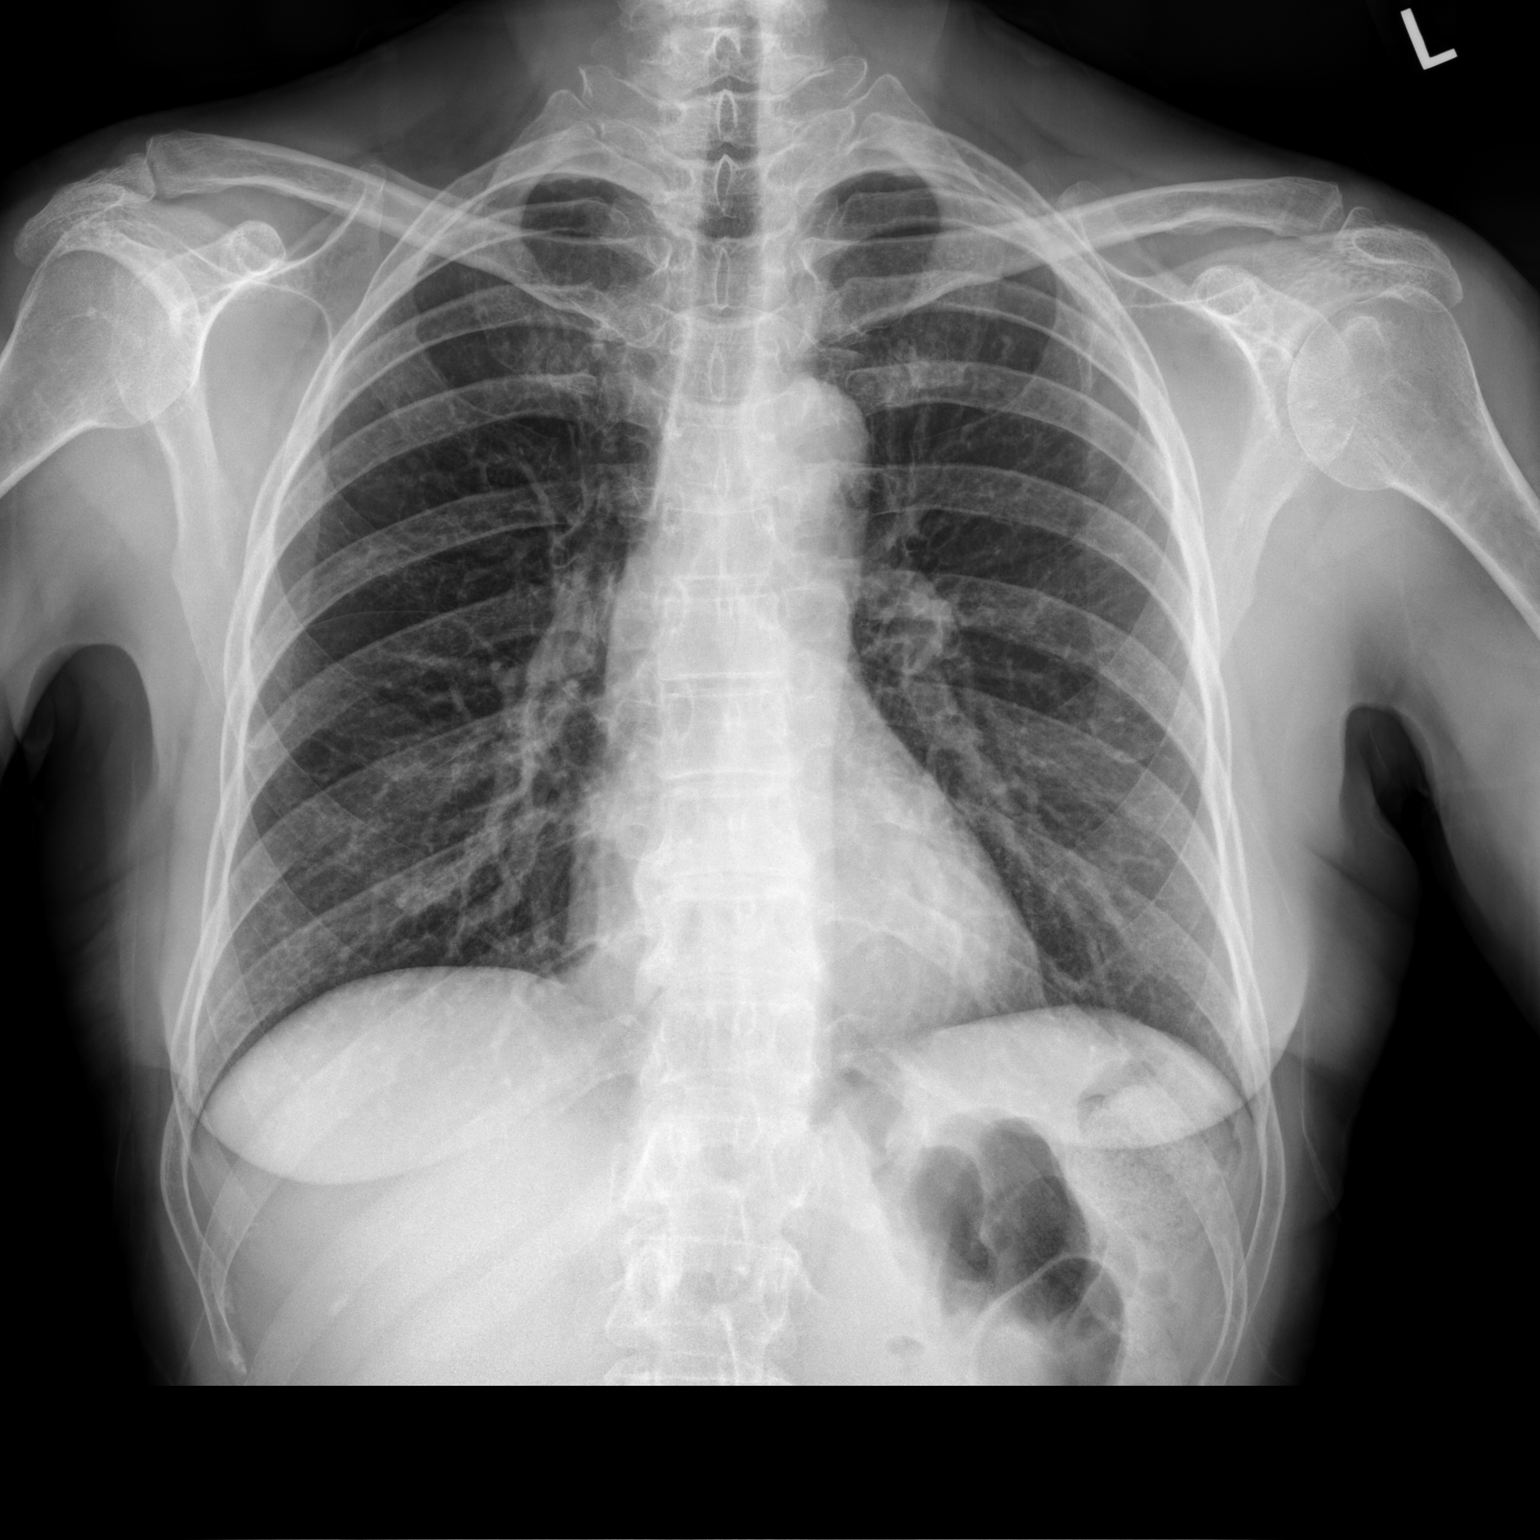

[chest lat]
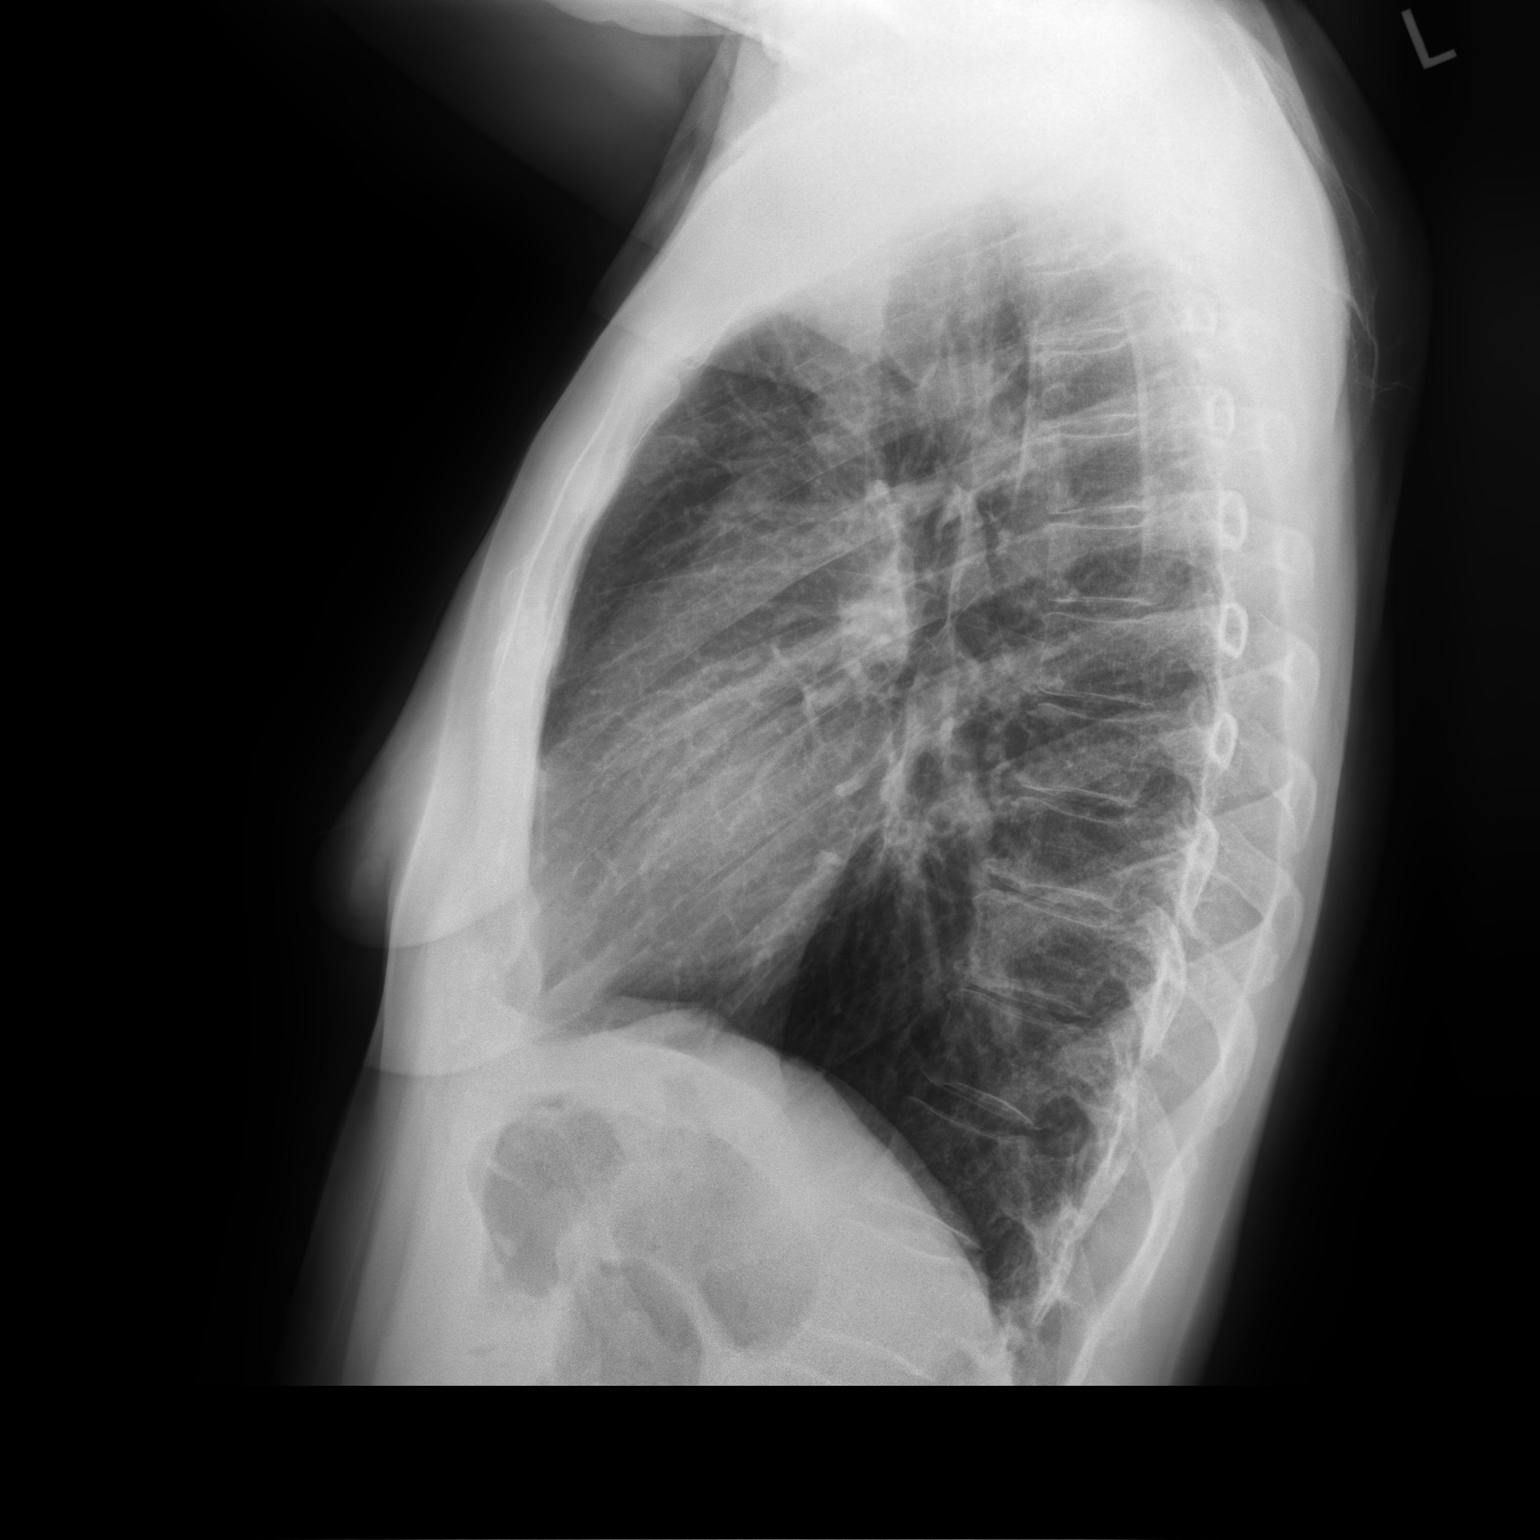

[2 of 2 positions shown; findings below may reference images not displayed]

FINDINGS: Normal mediastinum and cardiac silhouette. Normal pulmonary
vasculature. No evidence of effusion, infiltrate, or pneumothorax.
No acute bony abnormality.
IMPRESSION: No acute cardiopulmonary process.

## 2020-06-14 ENCOUNTER — Encounter: Payer: Self-pay | Admitting: Physician Assistant

## 2020-06-14 ENCOUNTER — Ambulatory Visit (INDEPENDENT_AMBULATORY_CARE_PROVIDER_SITE_OTHER): Payer: BC Managed Care – PPO | Admitting: Physician Assistant

## 2020-06-14 ENCOUNTER — Other Ambulatory Visit: Payer: Self-pay

## 2020-06-14 DIAGNOSIS — G47419 Narcolepsy without cataplexy: Secondary | ICD-10-CM

## 2020-06-14 DIAGNOSIS — F900 Attention-deficit hyperactivity disorder, predominantly inattentive type: Secondary | ICD-10-CM | POA: Diagnosis not present

## 2020-06-14 DIAGNOSIS — F319 Bipolar disorder, unspecified: Secondary | ICD-10-CM | POA: Diagnosis not present

## 2020-06-14 DIAGNOSIS — F429 Obsessive-compulsive disorder, unspecified: Secondary | ICD-10-CM | POA: Diagnosis not present

## 2020-06-14 DIAGNOSIS — G4700 Insomnia, unspecified: Secondary | ICD-10-CM

## 2020-06-14 MED ORDER — AMPHETAMINE-DEXTROAMPHETAMINE 15 MG PO TABS: 15.0000 mg | ORAL_TABLET | Freq: Two times a day (BID) | ORAL | 0 refills | Status: DC

## 2020-06-14 MED ORDER — AMPHETAMINE-DEXTROAMPHETAMINE 15 MG PO TABS: 15.0000 mg | ORAL_TABLET | Freq: Every day | ORAL | 0 refills | Status: DC

## 2020-06-14 MED ORDER — TRAZODONE HCL 100 MG PO TABS: 100.0000 mg | ORAL_TABLET | Freq: Every evening | ORAL | 3 refills | Status: DC | PRN

## 2020-06-14 MED ORDER — QUETIAPINE FUMARATE 300 MG PO TABS: ORAL_TABLET | ORAL | 3 refills | Status: DC

## 2020-06-14 NOTE — Progress Notes (Signed)
[facility]  Patient ID: Lindsay [name],  MRN: [id number]  PCP: Aliene Beams, MD  Date of Evaluation: 06/14/2020 Time spent:30 minutes  Chief Complaint:  Chief Complaint    Anxiety; Depression      HISTORY/CURRENT STATUS: HPI For routine med check.   Doing well.  States that she is usually depressed progress, of the year but so far it has not hit her.  She feels good about the medications and where she is as far as her mental health goes.  She does mention that she still can doze off during the day if she does not have a lot of things going on.  She is not willing to sit still so he prefers to be awake so she can do things, not "sleep her life away."  Increasing the Adderall since her last visit has been helpful.  And it has not caused mania this time.  When she was on a higher dose of stimulants last year she became manic.  But she was not well treated with an antipsychotic at that time.  She is diligent about taking the Seroquel now.  She is able to enjoy things.  Energy and motivation are good.  She is not isolating.  She does not cry easily.  She is able to focus and concentrate most of the time, and a stimulant helps that a lot.  She is not isolating.  She continues to exercise daily.  No complaints of anxiety.  Denies suicidal or homicidal thoughts.  OCD symptoms are well controlled.  She does not obsess about spiritual things right now.  She does have times where she worries that she locked the door or things of that nature but it is not debilitating like it has been in the past.  Patient denies increased energy with decreased need for sleep, no increased talkativeness, no racing thoughts, no impulsivity or risky behaviors, no increased spending, no increased libido, no grandiosity, no increased irritability or anger, and no hallucinations.  Denies dizziness, syncope, seizures,tingling, tremor, tics, unsteady gait, slurred speech, confusion. Denies muscle or joint  pain, stiffness, or dystonia.  Individual Medical History/ Review of Systems: Changes? :Yes   Had pinched nerve right buttock, seeing  Chiropractor and PT. Getting better.   Past medications for mental health diagnoses include: Geodon, Lamictal, Latuda, Paxil, trazodone, Ambien, Seroquel, Vraylar, Risperdal, Saphris, Depakote, Fanapt, Abilify, Rexulti, Wellbutrin, Provigil, Nuvigil, Vyvanse, ECT  Allergies: Patient has no known allergies.  Current Medications:  Current Outpatient Medications:  .  lamoTRIgine (LAMICTAL) 200 MG tablet, TAKE 1 TABLET(200 MG) BY MOUTH DAILY, Disp: 90 tablet, Rfl: 3 .  liothyronine (CYTOMEL) 5 MCG tablet, , Disp: , Rfl:  .  PARoxetine (PAXIL) 30 MG tablet, TAKE 1&1/2 TABLET BY MOUTH DAILY, Disp: 135 tablet, Rfl: 3 .  SYNTHROID 112 MCG tablet, TK 1 T PO QAM OES, Disp: , Rfl:  .  [START ON 07/22/2020] amphetamine-dextroamphetamine (ADDERALL) 15 MG tablet, Take 1 tablet by mouth 2 (two) times daily with breakfast and lunch., Disp: 60 tablet, Rfl: 0 .  amphetamine-dextroamphetamine (ADDERALL) 15 MG tablet, Take 1 tablet by mouth daily., Disp: 60 tablet, Rfl: 0 .  QUEtiapine (SEROQUEL) 300 MG tablet, TAKE 1 AND 1/2 TABLETS(450 MG) BY MOUTH AT BEDTIME, Disp: 135 tablet, Rfl: 3 .  traZODone (DESYREL) 100 MG tablet, Take 1-2 tablets (100-200 mg total) by mouth at bedtime as needed for sleep., Disp: 180 tablet, Rfl: 3 Medication Side Effects: none  Family Medical/ Social History: Changes? No  MENTAL HEALTH EXAM:  There were no vitals taken for this visit.There is no height or weight on file to calculate BMI.  General Appearance: Casual, Neat and Well Groomed  Eye Contact:  Good  Speech:  Clear and Coherent and Normal Rate  Volume:  Normal  Mood:  Euthymic  Affect:  Appropriate  Thought Process:  Goal Directed and Descriptions of Associations: Intact  Orientation:  Full (Time, Place, and Person)  Thought Content: Logical   Suicidal Thoughts:  No  Homicidal  Thoughts:  No  Memory:  WNL  Judgement:  Good  Insight:  Good  Psychomotor Activity:  Normal  Concentration:  Concentration: Good  Recall:  Good  Fund of Knowledge: Good  Language: Good  Assets:  Desire for Improvement  ADL's:  Intact  Cognition: WNL  Prognosis:  Good    DIAGNOSES:    ICD-10-CM   1. Bipolar I disorder (HCC)  F31.9   2. 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 Attention deficit hyperactivity disorder (ADHD), predominantly inattentive type  F90.0   5. Insomnia, unspecified type  G47.00     Receiving Psychotherapy: Yes    RECOMMENDATIONS:  PDMP was reviewed. I provided 30 minutes of face-to-face time during this encounter. I am glad to see her doing so well and not more depressed at this point in the winter season.  I still would like to follow closely as we have another 2 months with more dark days.  She understands and agrees.  I reminded her about therapy lamp.  She has used it before with some relief. Continue Adderall 15 mg, 1 p.o. at breakfast, 1 p.o. at lunch. Continue Lamictal 200 mg, 1 p.o. daily. Continue Paxil 30 mg, 1.5 pills daily. Continue Seroquel 300 mg, 1.5 pills nightly. Continue trazodone 100 mg, 1-2 nightly as needed sleep. Return in 6 weeks.  [name], PA-C

## 2020-06-15 DIAGNOSIS — Z01818 Encounter for other preprocedural examination: Secondary | ICD-10-CM | POA: Diagnosis not present

## 2020-06-15 DIAGNOSIS — Z0000 Encounter for general adult medical examination without abnormal findings: Secondary | ICD-10-CM | POA: Diagnosis not present

## 2020-06-15 DIAGNOSIS — Z23 Encounter for immunization: Secondary | ICD-10-CM | POA: Diagnosis not present

## 2020-06-15 DIAGNOSIS — Z01810 Encounter for preprocedural cardiovascular examination: Secondary | ICD-10-CM | POA: Diagnosis not present

## 2020-06-15 DIAGNOSIS — Z13220 Encounter for screening for lipoid disorders: Secondary | ICD-10-CM | POA: Diagnosis not present

## 2020-06-18 DIAGNOSIS — M5136 Other intervertebral disc degeneration, lumbar region: Secondary | ICD-10-CM | POA: Diagnosis not present

## 2020-06-18 DIAGNOSIS — M5416 Radiculopathy, lumbar region: Secondary | ICD-10-CM | POA: Diagnosis not present

## 2020-06-20 ENCOUNTER — Telehealth: Payer: Self-pay | Admitting: Physician Assistant

## 2020-06-20 DIAGNOSIS — M5417 Radiculopathy, lumbosacral region: Secondary | ICD-10-CM | POA: Diagnosis not present

## 2020-06-20 DIAGNOSIS — M9903 Segmental and somatic dysfunction of lumbar region: Secondary | ICD-10-CM | POA: Diagnosis not present

## 2020-06-20 DIAGNOSIS — M542 Cervicalgia: Secondary | ICD-10-CM | POA: Diagnosis not present

## 2020-06-20 DIAGNOSIS — M9901 Segmental and somatic dysfunction of cervical region: Secondary | ICD-10-CM | POA: Diagnosis not present

## 2020-06-20 NOTE — Telephone Encounter (Signed)
Pt called and said that she has noticed for the last week when she takes her adderall she gets so sleepy that she has to go lay down. Pt would like to know if she may need a med adjustment. Please call.

## 2020-06-20 NOTE — Telephone Encounter (Signed)
I called and left a message on her voicemail which was identified as the patient.  Message stated that I did not want to change any medications at this point.  I would like for her to give it a couple of more weeks.  If not any better by January 10 or so, call.  I did not mention this in the message but I think she could just be tired from Christmas festivities.

## 2020-06-25 DIAGNOSIS — B370 Candidal stomatitis: Secondary | ICD-10-CM | POA: Diagnosis not present

## 2020-06-25 DIAGNOSIS — E871 Hypo-osmolality and hyponatremia: Secondary | ICD-10-CM | POA: Diagnosis not present

## 2020-06-25 DIAGNOSIS — R7303 Prediabetes: Secondary | ICD-10-CM | POA: Diagnosis not present

## 2020-06-28 DIAGNOSIS — M542 Cervicalgia: Secondary | ICD-10-CM | POA: Diagnosis not present

## 2020-06-28 DIAGNOSIS — M9903 Segmental and somatic dysfunction of lumbar region: Secondary | ICD-10-CM | POA: Diagnosis not present

## 2020-06-28 DIAGNOSIS — M9901 Segmental and somatic dysfunction of cervical region: Secondary | ICD-10-CM | POA: Diagnosis not present

## 2020-06-28 DIAGNOSIS — M5417 Radiculopathy, lumbosacral region: Secondary | ICD-10-CM | POA: Diagnosis not present

## 2020-07-02 ENCOUNTER — Other Ambulatory Visit: Payer: Self-pay | Admitting: Physician Assistant

## 2020-07-02 MED ORDER — AMPHETAMINE-DEXTROAMPHETAMINE 15 MG PO TABS: 15.0000 mg | ORAL_TABLET | Freq: Two times a day (BID) | ORAL | 0 refills | Status: DC

## 2020-07-06 DIAGNOSIS — F3341 Major depressive disorder, recurrent, in partial remission: Secondary | ICD-10-CM | POA: Diagnosis not present

## 2020-07-17 DIAGNOSIS — F3341 Major depressive disorder, recurrent, in partial remission: Secondary | ICD-10-CM | POA: Diagnosis not present

## 2020-07-25 ENCOUNTER — Other Ambulatory Visit: Payer: Self-pay

## 2020-07-25 ENCOUNTER — Encounter: Payer: Self-pay | Admitting: Neurology

## 2020-07-25 ENCOUNTER — Ambulatory Visit (INDEPENDENT_AMBULATORY_CARE_PROVIDER_SITE_OTHER): Payer: BC Managed Care – PPO | Admitting: Neurology

## 2020-07-25 VITALS — BP 118/76 | HR 75 | Ht 66.0 in | Wt 192.0 lb

## 2020-07-25 DIAGNOSIS — F429 Obsessive-compulsive disorder, unspecified: Secondary | ICD-10-CM

## 2020-07-25 DIAGNOSIS — G4721 Circadian rhythm sleep disorder, delayed sleep phase type: Secondary | ICD-10-CM

## 2020-07-25 DIAGNOSIS — Z87898 Personal history of other specified conditions: Secondary | ICD-10-CM | POA: Diagnosis not present

## 2020-07-25 DIAGNOSIS — F32A Depression, unspecified: Secondary | ICD-10-CM | POA: Diagnosis not present

## 2020-07-25 DIAGNOSIS — F310 Bipolar disorder, current episode hypomanic: Secondary | ICD-10-CM

## 2020-07-25 DIAGNOSIS — R5383 Other fatigue: Secondary | ICD-10-CM | POA: Diagnosis not present

## 2020-07-25 DIAGNOSIS — Z8669 Personal history of other diseases of the nervous system and sense organs: Secondary | ICD-10-CM

## 2020-07-25 NOTE — Patient Instructions (Signed)
Quality Sleep Information, Adult Quality sleep is important for your mental and physical health. It also improves your quality of life. Quality sleep means you: Are asleep for most of the time you are in bed. Fall asleep within 30 minutes. Wake up no more than once a night. Are awake for no longer than 20 minutes if you do wake up during the night. Most adults need 7-8 hours of quality sleep each night. How can poor sleep affect me? If you do not get enough quality sleep, you may have: Mood swings. Daytime sleepiness. Confusion. Decreased reaction time. Sleep disorders, such as insomnia and sleep apnea. Difficulty with: Solving problems. Coping with stress. Paying attention. These issues may affect your performance and productivity at work, school, and at home. Lack of sleep may also put you at higher risk for accidents, suicide, and risky behaviors. If you do not get quality sleep you may also be at higher risk for several health problems, including: Infections. Type 2 diabetes. Heart disease. High blood pressure. Obesity. Worsening of long-term conditions, like arthritis, kidney disease, depression, Parkinson's disease, and epilepsy. What actions can I take to get more quality sleep? Stick to a sleep schedule. Go to sleep and wake up at about the same time each day. Do not try to sleep less on weekdays and make up for lost sleep on weekends. This does not work. Try to get about 30 minutes of exercise on most days. Do not exercise 2-3 hours before going to bed. Limit naps during the day to 30 minutes or less. Do not use any products that contain nicotine or tobacco, such as cigarettes or e-cigarettes. If you need help quitting, ask your health care provider. Do not drink caffeinated beverages for at least 8 hours before going to bed. Coffee, tea, and some sodas contain caffeine. Do not drink alcohol close to bedtime. Do not eat large meals close to bedtime. Do not take naps in the  late afternoon. Try to get at least 30 minutes of sunlight every day. Morning sunlight is best. Make time to relax before bed. Reading, listening to music, or taking a hot bath promotes quality sleep. Make your bedroom a place that promotes quality sleep. Keep your bedroom dark, quiet, and at a comfortable room temperature. Make sure your bed is comfortable. Take out sleep distractions like TV, a computer, smartphone, and bright lights. If you are lying awake in bed for longer than 20 minutes, get up and do a relaxing activity until you feel sleepy. Work with your health care provider to treat medical conditions that may affect sleeping, such as: Nasal obstruction. Snoring. Sleep apnea and other sleep disorders. Talk to your health care provider if you think any of your prescription medicines may cause you to have difficulty falling or staying asleep. If you have sleep problems, talk with a sleep consultant. If you think you have a sleep disorder, talk with your health care provider about getting evaluated by a specialist.      Where to find more information [place] website: https://sleepfoundation.org National Heart, Lung, and [place] ([place]): http://www.saunders.info/.pdf Centers for Disease Control and Prevention (CDC): LearningDermatology.pl Contact a health care provider if you: Have trouble getting to sleep or staying asleep. Often wake up very early in the morning and cannot get back to sleep. Have daytime sleepiness. Have daytime sleep attacks of suddenly falling asleep and sudden muscle weakness (narcolepsy). Have a tingling sensation in your legs with a strong urge to move your  legs (restless legs syndrome). Stop breathing briefly during sleep (sleep apnea). Think you have a sleep disorder or are taking a medicine that is affecting your quality of sleep. Summary Most adults need 7-8 hours of quality sleep each  night. Getting enough quality sleep is an important part of health and well-being. Make your bedroom a place that promotes quality sleep and avoid things that may cause you to have poor sleep, such as alcohol, caffeine, smoking, and large meals. Talk to your health care provider if you have trouble falling asleep or staying asleep. This information is not intended to replace advice given to you by your health care provider. Make sure you discuss any questions you have with your health care provider. Document Revised: 09/23/2017 Document Reviewed: 09/23/2017 Elsevier Patient Education  2021 [place]. Fatigue If you have fatigue, you feel tired all the time and have a lack of energy or a lack of motivation. Fatigue may make it difficult to start or complete tasks because of exhaustion. In general, occasional or mild fatigue is often a normal response to activity or life. However, long-lasting (chronic) or extreme fatigue may be a symptom of a medical condition. Follow these instructions at home: General instructions  Watch your fatigue for any changes.  Go to bed and get up at the same time every day.  Avoid fatigue by pacing yourself during the day and getting enough sleep at night.  Maintain a healthy weight. Medicines  Take over-the-counter and prescription medicines only as told by your health care provider.  Take a multivitamin, if told by your health care provider.  Do not use herbal or dietary supplements unless they are approved by your health care provider. Activity  Exercise regularly, as told by your health care provider.  Use or practice techniques to help you relax, such as yoga, tai chi, meditation, or massage therapy.   Eating and drinking  Avoid heavy meals in the evening.  Eat a well-balanced diet, which includes lean proteins, whole grains, plenty of fruits and vegetables, and low-fat dairy products.  Avoid consuming too much caffeine.  Avoid the use of  alcohol.  Drink enough fluid to keep your urine pale yellow.   Lifestyle  Change situations that cause you stress. Try to keep your work and personal schedule in balance.  Do not use any products that contain nicotine or tobacco, such as cigarettes and e-cigarettes. If you need help quitting, ask your health care provider.  Do not use drugs. Contact a health care provider if:  Your fatigue does not get better.  You have a fever.  You suddenly lose or gain weight.  You have headaches.  You have trouble falling asleep or sleeping through the night.  You feel angry, guilty, anxious, or sad.  You are unable to have a bowel movement (constipation).  Your skin is dry.  You have swelling in your legs or another part of your body. Get help right away if:  You feel confused.  Your vision is blurry.  You feel faint or you pass out.  You have a severe headache.  You have severe pain in your abdomen, your back, or the area between your waist and hips (pelvis).  You have chest pain, shortness of breath, or an irregular or fast heartbeat.  You are unable to urinate, or you urinate less than normal.  You have abnormal bleeding, such as bleeding from the rectum, vagina, nose, lungs, or nipples.  You vomit blood.  You have thoughts  about hurting yourself or others. If you ever feel like you may hurt yourself or others, or have thoughts about taking your own life, get help right away. You can go to your nearest emergency department or call:  Your local emergency services (911 in the [place]  A suicide crisis helpline, such as the [place] at 8546127095. This is open 24 hours a day. Summary  If you have fatigue, you feel tired all the time and have a lack of energy or a lack of motivation.  Fatigue may make it difficult to start or complete tasks because of exhaustion.  Long-lasting (chronic) or extreme fatigue may be a symptom of a medical  condition.  Exercise regularly, as told by your health care provider.  Change situations that cause you stress. Try to keep your work and personal schedule in balance. This information is not intended to replace advice given to you by your health care provider. Make sure you discuss any questions you have with your health care provider. Document Revised: 01/05/2019 Document Reviewed: 03/11/2017 Elsevier Patient Education  2021 [employer].

## 2020-07-25 NOTE — Progress Notes (Signed)
SLEEP MEDICINE CLINIC    Provider:  Larey Seat, MD  Primary Care Physician:  Caren Macadam, [place] [address]     Referring Provider: Caren Macadam, [place],  [place] [address]          Chief Complaint according to patient   Patient presents with:    . New Patient (Initial Visit)           HISTORY OF PRESENT ILLNESS:  Lindsay [name] is a 60 - year- old Caucasian female patient with excessive daytime sleepiness, treated on adderall, and  here seen upon a new referral on 07/25/2020 from [facility] [place], she has just seen Dr Maxwell Caul for the last 4 years. She is followed by psychiatry. Had a psychotic break down about 12 month ago.  .  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 Dr Caprice Beaver had sent me at the time, my PSG was normal and my MSLT had a nap with REM sleep". We suspected even Narcolepsy but my mental health medication may have interfered."   I have been taking Paxil, Trazodone 50 mg, 5 mg Adderall and Seroquel 331m  and Lamictal.   Dr. KBuddy Duty her endocrinologist increased her thyroid medication and she feels a bit better. But she still feels her sleep is not restorative and refreshing, and she snores. She averages about 6 hours of sleep but often feels as if not sleeping nearly as long.    I have the pleasure of seeing Lindsay PELOTtoday again , last seen in 2011, a right -handed Caucasian female with persistent hypersomnia and fatigue. 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 She had a face lift on 07-18-2020- lower face only. She had blepharoplasty years earlier( Dr Abougo/ Dr. SCarolan Clines.   The patient had the first sleep study in the year 2011  with a result of a normal PSG, followed by MSLT.   Sleep relevant  medical history: No other family member with hypersomnia. Daughter has ADHD, OCD, bipolar .   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  She lives in a household with her spouse and a cat.  ETOH use -1 drink a week maximally, not a smoker- Caffeine intake in form of Coffee( 2 in AM ) Soda(/ ) Tea ( /) or energy drinks. Regular exercise 4-5 miles a day.    Sleep habits are as follows: The patient's dinner time is between 6-7 PM. The patient goes to bed at 12 PM and continues to sleep for 5-6 hours, wakes at 5-6 AM for a bathroom break. The preferred sleep position is supine or side ways, with the support of 2-3 pillows.  Dreams are reportedly rare.  6.30- 7.30  AM is the usual rise time. The patient wakes up spontaneously at 6   She reports not feeling refreshed or restored in AM, with symptoms such as dry mouth , but no morning headaches. There is some residual fatigue.  Naps are taken rarely .   Review of Systems: Out of a complete 14 system review, the patient complains of only the following symptoms, and all other reviewed systems are negative.:  Fatigue, sleepiness , snoring, fragmented sleep, Insomnia - not really , rather  no restorative sleep. No naps, more fatigued than sleepy.    How likely are you to doze in the following situations: 0 = not likely, 1 = slight chance, 2 = moderate chance, 3 = high chance   Sitting and Reading? Watching Television? Sitting inactive in a public place (theater or meeting)? As a passenger in a car for an hour without a break? Lying down in the afternoon when circumstances permit? Sitting and talking to someone? Sitting quietly after lunch without alcohol? In a car, while stopped for a few minutes in traffic?   Total = 21/ 24 points - ??? How does that correlate with her lack of naps.  She is not falling asleep so EPWORTH 8.   FSS endorsed at 43/ 63 points.   Social History   Socioeconomic  History  . Marital status: Married    Spouse name: Not on file  . Number of children: Not on file  . Years of education: Not on file  . Highest education level: Not on file  Occupational History  . Not on file  Tobacco Use  . Smoking status: Never Smoker  . Smokeless tobacco: Never Used  Substance and Sexual Activity  . Alcohol use: Yes    Alcohol/week: 1.0 standard drink    Types: 1 Glasses of wine per week    Comment: occ  . Drug use: No  . Sexual activity: Not on file  Other Topics Concern  . Not on file  Social History Narrative  . Not on file   Social Determinants of Health   Financial Resource Strain: Not on file  Food Insecurity: Not on file  Transportation Needs: Not on file  Physical Activity: Not on file  Stress: Not on file  Social Connections: Not on file    No family history on file.  Past Medical History:  Diagnosis Date  . Anxiety   . Bipolar disorder ([place])   . 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   Dx [facility] Dr. Nehemiah Settle  . Depression    OCD  . Hypothyroidism     Past Surgical History:  Procedure Laterality Date  . BLADDER SUSPENSION N/A 06/01/2015   Procedure: TRANSVAGINAL TAPE (TVT) PROCEDURE;  Surgeon: Everett Graff, MD;  Location: [facility];  Service: Gynecology;  Laterality: N/A;  . cesarean section times two     . CYSTOSCOPY N/A 06/01/2015   Procedure: CYSTOSCOPY;  Surgeon: Everett Graff, MD;  Location: [facility];  Service: Gynecology;  Laterality: N/A;  . DIAGNOSTIC LAPAROSCOPY    . DILATATION & CURRETTAGE/HYSTEROSCOPY WITH RESECTOCOPE N/A 06/01/2015   Procedure: [place];  Surgeon: Everett Graff, MD;  Location: [facility];  Service: Gynecology;  Laterality: N/A;  . DILATION AND CURETTAGE OF UTERUS    . FRACTURE SURGERY     on right heel   . LAPAROSCOPIC GASTRIC SLEEVE RESECTION       Current Outpatient Medications on File Prior to Visit  Medication Sig Dispense Refill  .  amphetamine-dextroamphetamine (ADDERALL) 15 MG tablet Take 1 tablet by mouth 2 (two) times daily with breakfast and lunch. 60 tablet 0  . Cholecalciferol 125 MCG (5000 UT) capsule cholecalciferol (vitamin D3) 125 mcg (5,000 unit) capsule  Take by oral route.    . Estradiol (ESTROGEL) 0.75 MG/1.25 GM (0.06%) topical gel Place 1.25 g onto the skin daily.    Marland Kitchen lamoTRIgine (LAMICTAL) 200 MG tablet TAKE 1 TABLET(200 MG) BY MOUTH DAILY 90 tablet 3  . levothyroxine (SYNTHROID) 125  MCG tablet 125 mcg.    . liothyronine (CYTOMEL) 5 MCG tablet     . Multiple Vitamin (MULTIVITAMIN) tablet Take 1 tablet by mouth daily.    . Omega-3 Fatty Acids (FISH OIL PO) Take by mouth.    Marland Kitchen PARoxetine (PAXIL) 30 MG tablet TAKE 1&1/2 TABLET BY MOUTH DAILY 135 tablet 3  . progesterone (PROMETRIUM) 200 MG capsule Take 200 mg by mouth daily.    . QUEtiapine (SEROQUEL) 300 MG tablet TAKE 1 AND 1/2 TABLETS(450 MG) BY MOUTH AT BEDTIME 135 tablet 3  . simvastatin (ZOCOR) 20 MG tablet SMARTSIG:1 Tablet(s) By Mouth Every Evening    . traZODone (DESYREL) 100 MG tablet Take 1-2 tablets (100-200 mg total) by mouth at bedtime as needed for sleep. (Patient taking differently: Take 50 mg by mouth at bedtime as needed for sleep.) 180 tablet 3  . vitamin B-12 (CYANOCOBALAMIN) 1000 MCG tablet Take 1,000 mcg by mouth daily.     No current facility-administered medications on file prior to visit.    No Known Allergies  Physical exam:  Today's Vitals   07/25/20 1311  BP: 118/76  Pulse: 75  Weight: 192 lb (87.1 kg)  Height: _0  (1.676 m)   Body mass index is 30.99 kg/m.   Wt Readings from Last 3 Encounters:  07/25/20 192 lb (87.1 kg)  05/31/20 190 lb (86.2 kg)  05/24/19 180 lb (81.6 kg)     Ht Readings from Last 3 Encounters:  07/25/20 _1  (1.676 m)  05/31/20 _2  (1.676 m)  05/24/19 _3  (1.676 m)      General: The patient is awake, alert and appears not in acute distress. The patient is well groomed. Head:  Normocephalic, atraumatic. Neck is supple. Mallampati 1,  neck circumference:15 inches . Nasal airflow  patent.  Retrognathia is not seen.  Dental status:  Cardiovascular:  Regular rate and cardiac rhythm by pulse,  without distended neck veins. Respiratory: Lungs are clear to auscultation.  Skin:  Without evidence of ankle edema, palmar erythema.  Face is swollen due to facial surgery just last week.   Trunk: The patient's posture is erect.   Neurologic exam : The patient is awake and alert, oriented to place and time.   Memory subjective described as intact.  Attention span & concentration ability appears limited.  Speech is logorrhoeic-,  without dysarthria, dysphonia or aphasia.  Mood and affect are hypomanic, talkative, pressured speech, tangential.   Cranial nerves: no loss of smell or taste reported  Pupils are equal and briskly reactive to light. Funduscopic exam normal..  Extraocular movements in vertical and horizontal planes were intact and without nystagmus.  No Diplopia. Visual fields by finger perimetry are intact. Hearing was intact to soft voice and finger rubbing.    Facial sensation intact to fine touch.  Facial motor strength is symmetric and tongue and uvula move midline.  Neck ROM : rotation, tilt and flexion extension were normal for age and shoulder shrug was symmetrical.    Motor exam:  Symmetric bulk, tone and ROM.   Normal tone without cog wheeling, symmetric grip strength . Sensory:  vibration is normal.  Coordination: Rapid alternating movements in the fingers/hands were of normal speed. The Finger-to-nose maneuver was intact without evidence of ataxia, dysmetria and a very fine amplitude  tremor.  Gait and station: Patient could rise unassisted from a seated position, walked without assistive device.  Stance is of normal width/ base and the patient turned with 3 steps. Toe  and heel walk were deferred.  Deep tendon reflexes: in the  upper and lower  extremities are symmetric and intact.  Babinski response was deferred.        After spending a total time of 45 minutes face to face and additional time for physical and neurologic examination, review of laboratory studies,  personal review of imaging studies, reports and results of other testing and review of referral information / records as far as provided in visit, I have established the following assessments:  1)  No evidence of true hypersomnia, more fatigue, not sure we would diagnose her by today's criteria for   narcolepsy. 2) ADHD , OCD and bipolar 1 disorders affect her sleep and wakefulness, her motivation and her exercise tolerance. Medications also affect sleep.  3) fatigue goes together with thyroid disease.    My Plan is to proceed with:  1)  HLA test for narcolepsy 2)  HST  3) no need for oxygen. No cluster headaches.   I would like to thank Caren Macadam, MD and Caren Macadam, Md [place],  [place] [address] for allowing me to meet with and to take care of this pleasant patient.    Selinda Flavin is presenting with a high level of fatigue = and we will check if Sleep apnea or another organic cause is presnet. .   I plan to follow up either personally or through our NP within 3 month.   CC: I will share my notes with PCP.   Electronically signed by: Larey Seat, MD 07/25/2020 1:17 PM  [facility] Board certified by The [employer] of Sleep Medicine and Diplomate of the [employer] of Sleep Medicine. Board certified In Neurology through the [place], Fellow of the [employer] of Neurology. Medical Director of [facility].

## 2020-07-26 ENCOUNTER — Telehealth: Payer: Self-pay

## 2020-07-26 NOTE — Telephone Encounter (Signed)
LVM for pt to call me back to schedule sleep study  

## 2020-07-27 DIAGNOSIS — F3341 Major depressive disorder, recurrent, in partial remission: Secondary | ICD-10-CM | POA: Diagnosis not present

## 2020-07-30 ENCOUNTER — Telehealth: Payer: Self-pay

## 2020-07-30 NOTE — Telephone Encounter (Signed)
LVM for pt to call me back to schedule sleep study  

## 2020-08-02 ENCOUNTER — Encounter: Payer: Self-pay | Admitting: Physician Assistant

## 2020-08-02 ENCOUNTER — Ambulatory Visit (INDEPENDENT_AMBULATORY_CARE_PROVIDER_SITE_OTHER): Payer: BC Managed Care – PPO | Admitting: Physician Assistant

## 2020-08-02 ENCOUNTER — Other Ambulatory Visit: Payer: Self-pay

## 2020-08-02 DIAGNOSIS — G47419 Narcolepsy without cataplexy: Secondary | ICD-10-CM

## 2020-08-02 DIAGNOSIS — F319 Bipolar disorder, unspecified: Secondary | ICD-10-CM

## 2020-08-02 DIAGNOSIS — F900 Attention-deficit hyperactivity disorder, predominantly inattentive type: Secondary | ICD-10-CM

## 2020-08-02 DIAGNOSIS — F429 Obsessive-compulsive disorder, unspecified: Secondary | ICD-10-CM | POA: Diagnosis not present

## 2020-08-02 MED ORDER — AMPHETAMINE-DEXTROAMPHETAMINE 15 MG PO TABS: 15.0000 mg | ORAL_TABLET | Freq: Two times a day (BID) | ORAL | 0 refills | Status: DC

## 2020-08-02 NOTE — Progress Notes (Signed)
[facility]  Patient ID: Lindsay [name],  MRN: [id number]  PCP: Aliene Beams, MD  Date of Evaluation: 08/02/2020 Time spent:20 minutes  Chief Complaint:  Chief Complaint    Depression; Insomnia      HISTORY/CURRENT STATUS: HPI For routine med check.   Had her face lift done a few weeks ago. Very happy with the results.   Mood is stable and she is able to enjoy things.  Energy and motivation are good.  She is not isolating.  She does not cry easily.  She is able to focus and concentrate with the Adderall  Mostly takes that for narcolepsy, not ADHD. Continues to exercise, weather permitting.  No complaints of anxiety.  Denies suicidal or homicidal thoughts.  OCD symptoms are well controlled. Not having a lot of anxiety.Sleeps well.  Patient denies increased energy with decreased need for sleep, no increased talkativeness, no racing thoughts, no impulsivity or risky behaviors, no increased spending, no increased libido, no grandiosity, no increased irritability or anger, no paranoia, and no hallucinations.  Denies dizziness, syncope, seizures,tingling, tremor, tics, unsteady gait, slurred speech, confusion. Denies muscle or joint pain, stiffness, or dystonia.  Individual Medical History/ Review of Systems: Changes? :Yes  see HPI.    Past medications for mental health diagnoses include: Geodon, Lamictal, Latuda, Paxil, trazodone, Ambien, Seroquel, Vraylar, Risperdal, Saphris, Depakote, Fanapt, Abilify, Rexulti, Wellbutrin, Provigil, Nuvigil, Vyvanse, ECT  Allergies: Patient has no known allergies.  Current Medications:  Current Outpatient Medications:  .  [START ON 08/29/2020] amphetamine-dextroamphetamine (ADDERALL) 15 MG tablet, Take 1 tablet by mouth 2 (two) times daily., Disp: 60 tablet, Rfl: 0 .  Cholecalciferol 125 MCG (5000 UT) capsule, cholecalciferol (vitamin D3) 125 mcg (5,000 unit) capsule  Take by oral route., Disp: , Rfl:  .  Estradiol (ESTROGEL) 0.75  MG/1.25 GM (0.06%) topical gel, Place 1.25 g onto the skin daily., Disp: , Rfl:  .  lamoTRIgine (LAMICTAL) 200 MG tablet, TAKE 1 TABLET(200 MG) BY MOUTH DAILY, Disp: 90 tablet, Rfl: 3 .  levothyroxine (SYNTHROID) 125 MCG tablet, 125 mcg., Disp: , Rfl:  .  liothyronine (CYTOMEL) 5 MCG tablet, , Disp: , Rfl:  .  Multiple Vitamin (MULTIVITAMIN) tablet, Take 1 tablet by mouth daily., Disp: , Rfl:  .  Omega-3 Fatty Acids (FISH OIL PO), Take by mouth., Disp: , Rfl:  .  PARoxetine (PAXIL) 30 MG tablet, TAKE 1&1/2 TABLET BY MOUTH DAILY, Disp: 135 tablet, Rfl: 3 .  progesterone (PROMETRIUM) 200 MG capsule, Take 200 mg by mouth daily., Disp: , Rfl:  .  QUEtiapine (SEROQUEL) 300 MG tablet, TAKE 1 AND 1/2 TABLETS(450 MG) BY MOUTH AT BEDTIME, Disp: 135 tablet, Rfl: 3 .  simvastatin (ZOCOR) 20 MG tablet, SMARTSIG:1 Tablet(s) By Mouth Every Evening, Disp: , Rfl:  .  traZODone (DESYREL) 100 MG tablet, Take 1-2 tablets (100-200 mg total) by mouth at bedtime as needed for sleep. (Patient taking differently: Take 50 mg by mouth at bedtime as needed for sleep.), Disp: 180 tablet, Rfl: 3 .  vitamin B-12 (CYANOCOBALAMIN) 1000 MCG tablet, Take 1,000 mcg by mouth daily., Disp: , Rfl:  .  amphetamine-dextroamphetamine (ADDERALL) 15 MG tablet, Take 1 tablet by mouth 2 (two) times daily with breakfast and lunch., Disp: 60 tablet, Rfl: 0 Medication Side Effects: none  Family Medical/ Social History: Changes? No  MENTAL HEALTH EXAM:  There were no vitals taken for this visit.There is no height or weight on file to calculate BMI.  General Appearance: Casual, Neat and Well  Groomed  Eye Contact:  Good  Speech:  Clear and Coherent and Normal Rate  Volume:  Normal  Mood:  Euthymic  Affect:  Appropriate  Thought Process:  Goal Directed and Descriptions of Associations: Intact  Orientation:  Full (Time, Place, and Person)  Thought Content: Logical   Suicidal Thoughts:  No  Homicidal Thoughts:  No  Memory:  WNL   Judgement:  Good  Insight:  Good  Psychomotor Activity:  Normal  Concentration:  Concentration: Good and Attention Span: Good  Recall:  Good  Fund of Knowledge: Good  Language: Good  Assets:  Desire for Improvement  ADL's:  Intact  Cognition: WNL  Prognosis:  Good    DIAGNOSES:    ICD-10-CM   1. 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 Bipolar I disorder (HCC)  F31.9   3. Attention deficit hyperactivity disorder (ADHD), predominantly inattentive type  F90.0   4. 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 I provided 20 mins of face to face time during this encounter, discussing her response to medicines. She is doing well so no changes will be made.  Continue Adderall 15 mg, 1 p.o. at breakfast, 1 p.o. at lunch. Continue Lamictal 200 mg, 1 p.o. daily. Continue Paxil 30 mg, 1.5 pills daily. Continue Seroquel 300 mg, 1.5 pills nightly. Continue trazodone 100 mg, 1-2 nightly as needed sleep. Return in 6-8 weeks.  [name], PA-C

## 2020-08-03 LAB — NARCOLEPSY EVALUATION
DQA1*01:02: NEGATIVE
DQB1*06:02: NEGATIVE

## 2020-08-06 NOTE — Progress Notes (Signed)
Negative HLA for narcolepsy-

## 2020-08-07 ENCOUNTER — Telehealth: Payer: Self-pay | Admitting: Neurology

## 2020-08-07 DIAGNOSIS — R350 Frequency of micturition: Secondary | ICD-10-CM | POA: Diagnosis not present

## 2020-08-07 DIAGNOSIS — R87610 Atypical squamous cells of undetermined significance on cytologic smear of cervix (ASC-US): Secondary | ICD-10-CM | POA: Diagnosis not present

## 2020-08-07 DIAGNOSIS — Z01419 Encounter for gynecological examination (general) (routine) without abnormal findings: Secondary | ICD-10-CM | POA: Diagnosis not present

## 2020-08-07 DIAGNOSIS — Z1231 Encounter for screening mammogram for malignant neoplasm of breast: Secondary | ICD-10-CM | POA: Diagnosis not present

## 2020-08-07 NOTE — Telephone Encounter (Signed)
Called the patient to advise that the lab results came back for the gene that test for narcolepsy. There was no answer and no DPR on file LVM asking to the patient to call back.   **When patient returns call please inform her that Dr. Vickey Huger reviewed the lab result that looks to see if she carries the narcolepsy gene.  It was negative in both strands.  Please advise the patient that this does not mean she does not have narcolepsy but means that she does not carry the gene.  We will move forward with completing the home sleep test and once we have those results we will be in touch with the patient.

## 2020-08-07 NOTE — Telephone Encounter (Signed)
-----  Message from Larey Seat, MD sent at 08/06/2020  5:48 PM EST ----- Negative HLA for narcolepsy.

## 2020-08-09 ENCOUNTER — Telehealth: Payer: Self-pay

## 2020-08-09 NOTE — Telephone Encounter (Signed)
Called the patient a second time, there was no answer.  Found a DPR on file that allows voicemail to be left.  Left voicemail informing him of the results.  Advised the patient to call back if she has questions.  Also informed the patient that our sleep lab has been trying to reach her to get her scheduled for the sleep study.  Informed her to call back to schedule that.

## 2020-08-09 NOTE — Telephone Encounter (Signed)
Patient is returning your call. Left a vm for you to return her call. Thanks.

## 2020-08-09 NOTE — Telephone Encounter (Signed)
Patient had called back and left voicemail on the sleep lab number.  Called the patient back reviewed lab work.  Patient is already scheduled to 2/21 for her home sleep test.  Advised the patient once the home sleep test is completed, Dr. Vickey Huger will review the results and will be in touch with the results.  Patient verbalized understanding, had no further questions and was appreciative for the call back

## 2020-08-15 DIAGNOSIS — L905 Scar conditions and fibrosis of skin: Secondary | ICD-10-CM | POA: Diagnosis not present

## 2020-08-15 DIAGNOSIS — L819 Disorder of pigmentation, unspecified: Secondary | ICD-10-CM | POA: Diagnosis not present

## 2020-08-15 DIAGNOSIS — L814 Other melanin hyperpigmentation: Secondary | ICD-10-CM | POA: Diagnosis not present

## 2020-08-15 DIAGNOSIS — D1801 Hemangioma of skin and subcutaneous tissue: Secondary | ICD-10-CM | POA: Diagnosis not present

## 2020-08-24 DIAGNOSIS — F3341 Major depressive disorder, recurrent, in partial remission: Secondary | ICD-10-CM | POA: Diagnosis not present

## 2020-08-27 DIAGNOSIS — M9903 Segmental and somatic dysfunction of lumbar region: Secondary | ICD-10-CM | POA: Diagnosis not present

## 2020-08-27 DIAGNOSIS — M542 Cervicalgia: Secondary | ICD-10-CM | POA: Diagnosis not present

## 2020-08-27 DIAGNOSIS — M5417 Radiculopathy, lumbosacral region: Secondary | ICD-10-CM | POA: Diagnosis not present

## 2020-08-27 DIAGNOSIS — M9901 Segmental and somatic dysfunction of cervical region: Secondary | ICD-10-CM | POA: Diagnosis not present

## 2020-08-29 DIAGNOSIS — F3341 Major depressive disorder, recurrent, in partial remission: Secondary | ICD-10-CM | POA: Diagnosis not present

## 2020-09-05 DIAGNOSIS — E039 Hypothyroidism, unspecified: Secondary | ICD-10-CM | POA: Diagnosis not present

## 2020-09-07 DIAGNOSIS — F3341 Major depressive disorder, recurrent, in partial remission: Secondary | ICD-10-CM | POA: Diagnosis not present

## 2020-09-11 DIAGNOSIS — N959 Unspecified menopausal and perimenopausal disorder: Secondary | ICD-10-CM | POA: Diagnosis not present

## 2020-09-14 DIAGNOSIS — F3341 Major depressive disorder, recurrent, in partial remission: Secondary | ICD-10-CM | POA: Diagnosis not present

## 2020-09-25 DIAGNOSIS — R350 Frequency of micturition: Secondary | ICD-10-CM | POA: Diagnosis not present

## 2020-09-25 DIAGNOSIS — L237 Allergic contact dermatitis due to plants, except food: Secondary | ICD-10-CM | POA: Diagnosis not present

## 2020-09-27 ENCOUNTER — Ambulatory Visit: Payer: BC Managed Care – PPO | Admitting: Physician Assistant

## 2020-10-01 ENCOUNTER — Telehealth: Payer: Self-pay | Admitting: Physician Assistant

## 2020-10-01 ENCOUNTER — Other Ambulatory Visit: Payer: Self-pay | Admitting: Physician Assistant

## 2020-10-01 MED ORDER — AMPHETAMINE-DEXTROAMPHETAMINE 15 MG PO TABS: 15.0000 mg | ORAL_TABLET | Freq: Two times a day (BID) | ORAL | 0 refills | Status: DC

## 2020-10-01 NOTE — Telephone Encounter (Signed)
Pt called asking for a refill on her adderall 15 mg to be sent to the [facility] on [address]. She  Has an appointment in June. So please send in more than one script

## 2020-10-01 NOTE — Telephone Encounter (Signed)
Prescription was sent

## 2020-10-01 NOTE — Telephone Encounter (Signed)
Please review

## 2020-10-03 DIAGNOSIS — F3341 Major depressive disorder, recurrent, in partial remission: Secondary | ICD-10-CM | POA: Diagnosis not present

## 2020-11-02 DIAGNOSIS — F3341 Major depressive disorder, recurrent, in partial remission: Secondary | ICD-10-CM | POA: Diagnosis not present

## 2020-11-20 DIAGNOSIS — E039 Hypothyroidism, unspecified: Secondary | ICD-10-CM | POA: Diagnosis not present

## 2020-11-28 ENCOUNTER — Other Ambulatory Visit: Payer: Self-pay

## 2020-11-28 ENCOUNTER — Telehealth: Payer: Self-pay | Admitting: Physician Assistant

## 2020-11-28 MED ORDER — AMPHETAMINE-DEXTROAMPHETAMINE 15 MG PO TABS: 15.0000 mg | ORAL_TABLET | Freq: Two times a day (BID) | ORAL | 0 refills | Status: DC

## 2020-11-28 NOTE — Telephone Encounter (Signed)
Pt would like a refill for Adderall. Please send to [facility] on [address]

## 2020-11-28 NOTE — Telephone Encounter (Signed)
pended

## 2020-11-30 DIAGNOSIS — F3341 Major depressive disorder, recurrent, in partial remission: Secondary | ICD-10-CM | POA: Diagnosis not present

## 2020-12-05 ENCOUNTER — Ambulatory Visit (INDEPENDENT_AMBULATORY_CARE_PROVIDER_SITE_OTHER): Payer: BC Managed Care – PPO | Admitting: Physician Assistant

## 2020-12-05 ENCOUNTER — Other Ambulatory Visit: Payer: Self-pay

## 2020-12-05 ENCOUNTER — Encounter: Payer: Self-pay | Admitting: Physician Assistant

## 2020-12-05 DIAGNOSIS — F429 Obsessive-compulsive disorder, unspecified: Secondary | ICD-10-CM

## 2020-12-05 DIAGNOSIS — F900 Attention-deficit hyperactivity disorder, predominantly inattentive type: Secondary | ICD-10-CM

## 2020-12-05 DIAGNOSIS — G4700 Insomnia, unspecified: Secondary | ICD-10-CM | POA: Diagnosis not present

## 2020-12-05 DIAGNOSIS — F319 Bipolar disorder, unspecified: Secondary | ICD-10-CM | POA: Diagnosis not present

## 2020-12-05 MED ORDER — AMPHETAMINE-DEXTROAMPHETAMINE 15 MG PO TABS: 15.0000 mg | ORAL_TABLET | Freq: Two times a day (BID) | ORAL | 0 refills | Status: DC

## 2020-12-05 NOTE — Progress Notes (Signed)
[facility]  Patient ID: Lindsay [name],  MRN: [id number]  PCP: Aliene Beams, MD  Date of Evaluation: 12/05/2020 Time spent:40 minutes  Chief Complaint:  Chief Complaint    Anxiety; Depression; Insomnia; Follow-up      HISTORY/CURRENT STATUS: HPI For routine med check.   Doing really well. Able to enjoy things. Works in the yard, continues to enjoy that as well as exercising.  Energy and motivation are good.  She is sleeping well.  She is not isolating.  Not having a lot of generalized anxiety but still has some OCD symptoms, mostly obsessive thoughts but not nearly as bad as they used to be.  The Adderall has helped that as well as the narcolepsy.  Also helped focus and concentration of course.  No suicidal or homicidal thoughts.  Patient denies increased energy with decreased need for sleep, no increased talkativeness, no racing thoughts, no impulsivity or risky behaviors, no increased spending, no increased libido, no grandiosity, no increased irritability or anger, no paranoia, and no hallucinations.  Denies dizziness, syncope, seizures,tingling, tremor, tics, unsteady gait, slurred speech, confusion. Denies muscle or joint pain, stiffness, or dystonia.  Individual Medical History/ Review of Systems: Changes? :Yes  see HPI.    Past medications for mental health diagnoses include: Geodon, Lamictal, Latuda, Paxil, trazodone, Ambien, Seroquel, Vraylar, Risperdal, Saphris, Depakote, Fanapt, Abilify, Rexulti, Wellbutrin, Provigil, Nuvigil, Vyvanse, ECT  Allergies: Patient has no known allergies.  Current Medications:  Current Outpatient Medications:  .  amphetamine-dextroamphetamine (ADDERALL) 15 MG tablet, Take 1 tablet by mouth 2 (two) times daily with breakfast and lunch., Disp: 60 tablet, Rfl: 0 .  [START ON 01/25/2021] amphetamine-dextroamphetamine (ADDERALL) 15 MG tablet, Take 1 tablet by mouth 2 (two) times daily., Disp: 60 tablet, Rfl: 0 .  [START ON 12/27/2020]  amphetamine-dextroamphetamine (ADDERALL) 15 MG tablet, Take 1 tablet by mouth 2 (two) times daily., Disp: 60 tablet, Rfl: 0 .  Cholecalciferol 125 MCG (5000 UT) capsule, cholecalciferol (vitamin D3) 125 mcg (5,000 unit) capsule  Take by oral route., Disp: , Rfl:  .  Estradiol (ESTROGEL) 0.75 MG/1.25 GM (0.06%) topical gel, Place 1.25 g onto the skin daily., Disp: , Rfl:  .  lamoTRIgine (LAMICTAL) 200 MG tablet, TAKE 1 TABLET(200 MG) BY MOUTH DAILY, Disp: 90 tablet, Rfl: 3 .  levothyroxine (SYNTHROID) 125 MCG tablet, 125 mcg., Disp: , Rfl:  .  liothyronine (CYTOMEL) 5 MCG tablet, , Disp: , Rfl:  .  Multiple Vitamin (MULTIVITAMIN) tablet, Take 1 tablet by mouth daily., Disp: , Rfl:  .  Omega-3 Fatty Acids (FISH OIL PO), Take by mouth., Disp: , Rfl:  .  PARoxetine (PAXIL) 30 MG tablet, TAKE 1&1/2 TABLET BY MOUTH DAILY, Disp: 135 tablet, Rfl: 3 .  progesterone (PROMETRIUM) 200 MG capsule, Take 200 mg by mouth daily., Disp: , Rfl:  .  QUEtiapine (SEROQUEL) 300 MG tablet, TAKE 1 AND 1/2 TABLETS(450 MG) BY MOUTH AT BEDTIME, Disp: 135 tablet, Rfl: 3 .  simvastatin (ZOCOR) 20 MG tablet, SMARTSIG:1 Tablet(s) By Mouth Every Evening, Disp: , Rfl:  .  traZODone (DESYREL) 100 MG tablet, Take 1-2 tablets (100-200 mg total) by mouth at bedtime as needed for sleep. (Patient taking differently: Take 50 mg by mouth at bedtime as needed for sleep.), Disp: 180 tablet, Rfl: 3 .  vitamin B-12 (CYANOCOBALAMIN) 1000 MCG tablet, Take 1,000 mcg by mouth daily., Disp: , Rfl:  .  [START ON 02/24/2021] amphetamine-dextroamphetamine (ADDERALL) 15 MG tablet, Take 1 tablet by mouth 2 (two) times daily.,  Disp: 60 tablet, Rfl: 0 Medication Side Effects: none  Family Medical/ Social History: Changes? No  MENTAL HEALTH EXAM:  There were no vitals taken for this visit.There is no height or weight on file to calculate BMI.  General Appearance: Casual, Neat and Well Groomed  Eye Contact:  Good  Speech:  Clear and Coherent, Pressured  and Talkative  Volume:  Normal  Mood:  Euphoric  Affect:  Congruent  Thought Process:  Goal Directed and Descriptions of Associations: Intact  Orientation:  Full (Time, Place, and Person)  Thought Content: Logical   Suicidal Thoughts:  No  Homicidal Thoughts:  No  Memory:  WNL  Judgement:  Good  Insight:  Good  Psychomotor Activity:  Normal  Concentration:  Concentration: Good and Attention Span: Good  Recall:  Good  Fund of Knowledge: Good  Language: Good  Assets:  Desire for Improvement  ADL's:  Intact  Cognition: WNL  Prognosis:  Good   Reviewed labs through [facility] physicians done December 2021.  Glucose and lipids were normal.  DIAGNOSES:    ICD-10-CM   1. 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 Bipolar I disorder (HCC)  F31.9   3. Attention deficit hyperactivity disorder (ADHD), predominantly inattentive type  F90.0   4. Insomnia, unspecified type  G47.00     Receiving Psychotherapy: Yes    RECOMMENDATIONS:  PDMP was reviewed.  Last Adderall was 11/28/2020. I provided 40 minutes of face to face time during this encounter, including time spent before and after the visit in records review, medical decision making, and charting.  She is a little manic today but this is not unusual for her at times.  Overall, she is doing well so no medication changes will be made. Continue Adderall 15 mg, 1 p.o. at breakfast, 1 p.o. at lunch. Continue Lamictal 200 mg, 1 p.o. daily. Continue Paxil 30 mg, 1.5 pills daily. Continue Seroquel 300 mg, 1.5 pills nightly. Continue trazodone 100 mg, 1-2 nightly as needed sleep. Return in 3 months.   [name], PA-C

## 2020-12-21 DIAGNOSIS — F3341 Major depressive disorder, recurrent, in partial remission: Secondary | ICD-10-CM | POA: Diagnosis not present

## 2021-01-08 DIAGNOSIS — F3341 Major depressive disorder, recurrent, in partial remission: Secondary | ICD-10-CM | POA: Diagnosis not present

## 2021-01-21 DIAGNOSIS — E039 Hypothyroidism, unspecified: Secondary | ICD-10-CM | POA: Diagnosis not present

## 2021-01-31 DIAGNOSIS — F3341 Major depressive disorder, recurrent, in partial remission: Secondary | ICD-10-CM | POA: Diagnosis not present

## 2021-02-07 DIAGNOSIS — F4322 Adjustment disorder with anxiety: Secondary | ICD-10-CM | POA: Diagnosis not present

## 2021-02-13 DIAGNOSIS — N951 Menopausal and female climacteric states: Secondary | ICD-10-CM | POA: Diagnosis not present

## 2021-02-14 DIAGNOSIS — F3341 Major depressive disorder, recurrent, in partial remission: Secondary | ICD-10-CM | POA: Diagnosis not present

## 2021-02-19 DIAGNOSIS — F4322 Adjustment disorder with anxiety: Secondary | ICD-10-CM | POA: Diagnosis not present

## 2021-02-26 DIAGNOSIS — J302 Other seasonal allergic rhinitis: Secondary | ICD-10-CM | POA: Diagnosis not present

## 2021-02-26 DIAGNOSIS — Z85828 Personal history of other malignant neoplasm of skin: Secondary | ICD-10-CM | POA: Diagnosis not present

## 2021-02-26 DIAGNOSIS — L989 Disorder of the skin and subcutaneous tissue, unspecified: Secondary | ICD-10-CM | POA: Diagnosis not present

## 2021-03-06 DIAGNOSIS — L578 Other skin changes due to chronic exposure to nonionizing radiation: Secondary | ICD-10-CM | POA: Diagnosis not present

## 2021-03-06 DIAGNOSIS — L821 Other seborrheic keratosis: Secondary | ICD-10-CM | POA: Diagnosis not present

## 2021-03-06 DIAGNOSIS — L570 Actinic keratosis: Secondary | ICD-10-CM | POA: Diagnosis not present

## 2021-03-06 DIAGNOSIS — D1801 Hemangioma of skin and subcutaneous tissue: Secondary | ICD-10-CM | POA: Diagnosis not present

## 2021-03-06 DIAGNOSIS — L814 Other melanin hyperpigmentation: Secondary | ICD-10-CM | POA: Diagnosis not present

## 2021-03-07 ENCOUNTER — Ambulatory Visit: Payer: BC Managed Care – PPO | Admitting: Physician Assistant

## 2021-03-07 ENCOUNTER — Encounter: Payer: Self-pay | Admitting: Physician Assistant

## 2021-03-07 ENCOUNTER — Other Ambulatory Visit: Payer: Self-pay

## 2021-03-07 DIAGNOSIS — G47419 Narcolepsy without cataplexy: Secondary | ICD-10-CM

## 2021-03-07 DIAGNOSIS — F319 Bipolar disorder, unspecified: Secondary | ICD-10-CM | POA: Diagnosis not present

## 2021-03-07 DIAGNOSIS — F3341 Major depressive disorder, recurrent, in partial remission: Secondary | ICD-10-CM | POA: Diagnosis not present

## 2021-03-07 DIAGNOSIS — G4700 Insomnia, unspecified: Secondary | ICD-10-CM | POA: Diagnosis not present

## 2021-03-07 DIAGNOSIS — F900 Attention-deficit hyperactivity disorder, predominantly inattentive type: Secondary | ICD-10-CM

## 2021-03-07 DIAGNOSIS — F429 Obsessive-compulsive disorder, unspecified: Secondary | ICD-10-CM

## 2021-03-07 MED ORDER — PAROXETINE HCL 30 MG PO TABS: ORAL_TABLET | ORAL | 3 refills | Status: DC

## 2021-03-07 MED ORDER — AMPHETAMINE-DEXTROAMPHETAMINE 15 MG PO TABS: 15.0000 mg | ORAL_TABLET | Freq: Two times a day (BID) | ORAL | 0 refills | Status: DC

## 2021-03-07 MED ORDER — TRAZODONE HCL 100 MG PO TABS: 200.0000 mg | ORAL_TABLET | Freq: Every evening | ORAL | 3 refills | Status: DC | PRN

## 2021-03-07 MED ORDER — QUETIAPINE FUMARATE 300 MG PO TABS: ORAL_TABLET | ORAL | 3 refills | Status: DC

## 2021-03-07 MED ORDER — LAMOTRIGINE 200 MG PO TABS: ORAL_TABLET | ORAL | 3 refills | Status: DC
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
## 2021-03-15 ENCOUNTER — Inpatient Hospital Stay (HOSPITAL_COMMUNITY)
Admission: AD | Admit: 2021-03-15 | Discharge: 2021-03-21 | DRG: 885 | Disposition: A | Discharge status: Home / Self Care | Payer: BC Managed Care – PPO | Source: Intra-hospital | Attending: Emergency Medicine | Admitting: Emergency Medicine

## 2021-03-15 ENCOUNTER — Emergency Department (HOSPITAL_BASED_OUTPATIENT_CLINIC_OR_DEPARTMENT_OTHER)
Admission: EM | Admit: 2021-03-15 | Discharge: 2021-03-15 | Disposition: A | Discharge status: Other Acute Inpatient Hospital | Payer: BC Managed Care – PPO | Source: Home / Self Care | Attending: Emergency Medicine | Admitting: Emergency Medicine

## 2021-03-15 ENCOUNTER — Ambulatory Visit (HOSPITAL_COMMUNITY)
Admission: EM | Admit: 2021-03-15 | Discharge: 2021-03-15 | Disposition: A | Discharge status: Other Acute Inpatient Hospital | Payer: BC Managed Care – PPO

## 2021-03-15 ENCOUNTER — Other Ambulatory Visit: Payer: Self-pay

## 2021-03-15 ENCOUNTER — Encounter (HOSPITAL_COMMUNITY): Payer: Self-pay | Admitting: Emergency Medicine

## 2021-03-15 ENCOUNTER — Encounter (HOSPITAL_BASED_OUTPATIENT_CLINIC_OR_DEPARTMENT_OTHER): Payer: Self-pay | Admitting: Emergency Medicine

## 2021-03-15 DIAGNOSIS — E039 Hypothyroidism, unspecified: Secondary | ICD-10-CM | POA: Diagnosis not present

## 2021-03-15 DIAGNOSIS — K5900 Constipation, unspecified: Secondary | ICD-10-CM | POA: Diagnosis present

## 2021-03-15 DIAGNOSIS — Z20822 Contact with and (suspected) exposure to covid-19: Secondary | ICD-10-CM | POA: Diagnosis not present

## 2021-03-15 DIAGNOSIS — F22 Delusional disorders: Secondary | ICD-10-CM | POA: Insufficient documentation

## 2021-03-15 DIAGNOSIS — F312 Bipolar disorder, current episode manic severe with psychotic features: Secondary | ICD-10-CM | POA: Diagnosis not present

## 2021-03-15 DIAGNOSIS — Y900 Blood alcohol level of less than 20 mg/100 ml: Secondary | ICD-10-CM | POA: Insufficient documentation

## 2021-03-15 DIAGNOSIS — Z79899 Other long term (current) drug therapy: Secondary | ICD-10-CM | POA: Insufficient documentation

## 2021-03-15 DIAGNOSIS — F429 Obsessive-compulsive disorder, unspecified: Secondary | ICD-10-CM | POA: Diagnosis not present

## 2021-03-15 DIAGNOSIS — E785 Hyperlipidemia, unspecified: Secondary | ICD-10-CM | POA: Diagnosis present

## 2021-03-15 DIAGNOSIS — F319 Bipolar disorder, unspecified: Secondary | ICD-10-CM

## 2021-03-15 DIAGNOSIS — F23 Brief psychotic disorder: Secondary | ICD-10-CM | POA: Diagnosis present

## 2021-03-15 LAB — CBC WITH DIFFERENTIAL/PLATELET
Abs Immature Granulocytes: 0.02 10*3/uL (ref 0.00–0.07)
Basophils Absolute: 0.1 10*3/uL (ref 0.0–0.1)
Basophils Relative: 1 %
Eosinophils Absolute: 0.2 10*3/uL (ref 0.0–0.5)
Eosinophils Relative: 3 %
HCT: 37.2 % (ref 36.0–46.0)
Hemoglobin: 12.2 g/dL (ref 12.0–15.0)
Immature Granulocytes: 0 %
Lymphocytes Relative: 33 %
Lymphs Abs: 2.1 10*3/uL (ref 0.7–4.0)
MCH: 30.5 pg (ref 26.0–34.0)
MCHC: 32.8 g/dL (ref 30.0–36.0)
MCV: 93 fL (ref 80.0–100.0)
Monocytes Absolute: 0.7 10*3/uL (ref 0.1–1.0)
Monocytes Relative: 10 %
Neutro Abs: 3.3 10*3/uL (ref 1.7–7.7)
Neutrophils Relative %: 53 %
Platelets: 220 10*3/uL (ref 150–400)
RBC: 4 MIL/uL (ref 3.87–5.11)
RDW: 13.5 % (ref 11.5–15.5)
WBC: 6.2 10*3/uL (ref 4.0–10.5)
nRBC: 0 % (ref 0.0–0.2)

## 2021-03-15 LAB — COMPREHENSIVE METABOLIC PANEL
ALT: 20 U/L (ref 0–44)
AST: 28 U/L (ref 15–41)
Albumin: 5.5 g/dL — ABNORMAL HIGH (ref 3.5–5.0)
Alkaline Phosphatase: 67 U/L (ref 38–126)
Anion gap: 11 (ref 5–15)
BUN: 30 mg/dL — ABNORMAL HIGH (ref 6–20)
CO2: 26 mmol/L (ref 22–32)
Calcium: 10.6 mg/dL — ABNORMAL HIGH (ref 8.9–10.3)
Chloride: 100 mmol/L (ref 98–111)
Creatinine, Ser: 1.28 mg/dL — ABNORMAL HIGH (ref 0.44–1.00)
GFR, Estimated: 48 mL/min — ABNORMAL LOW (ref >60)
Glucose, Bld: 82 mg/dL (ref 70–99)
Potassium: 3.7 mmol/L (ref 3.5–5.1)
Sodium: 137 mmol/L (ref 135–145)
Total Bilirubin: 0.3 mg/dL (ref 0.3–1.2)
Total Protein: 9.3 g/dL — ABNORMAL HIGH (ref 6.5–8.1)

## 2021-03-15 LAB — RAPID URINE DRUG SCREEN, HOSP PERFORMED
Amphetamines: POSITIVE — AB
Barbiturates: NOT DETECTED
Benzodiazepines: NOT DETECTED
Cocaine: NOT DETECTED
Opiates: NOT DETECTED
Tetrahydrocannabinol: NOT DETECTED

## 2021-03-15 LAB — URINALYSIS, ROUTINE W REFLEX MICROSCOPIC
Bilirubin Urine: NEGATIVE
Glucose, UA: NEGATIVE mg/dL
Hgb urine dipstick: NEGATIVE
Ketones, ur: NEGATIVE mg/dL
Leukocytes,Ua: NEGATIVE
Nitrite: NEGATIVE
Protein, ur: NEGATIVE mg/dL
Specific Gravity, Urine: 1.021 (ref 1.005–1.030)
pH: 5.5 (ref 5.0–8.0)

## 2021-03-15 LAB — RESP PANEL BY RT-PCR (FLU A&B, COVID) ARPGX2
Influenza A by PCR: NEGATIVE
Influenza B by PCR: NEGATIVE
SARS Coronavirus 2 by RT PCR: NEGATIVE

## 2021-03-15 LAB — SALICYLATE LEVEL: Salicylate Lvl: <7 mg/dL — ABNORMAL LOW (ref 7.0–30.0)

## 2021-03-15 LAB — ETHANOL: Alcohol, Ethyl (B): 12 mg/dL — ABNORMAL HIGH (ref <10)

## 2021-03-15 LAB — ACETAMINOPHEN LEVEL: Acetaminophen (Tylenol), Serum: <10 ug/mL — ABNORMAL LOW (ref 10–30)

## 2021-03-15 MED ORDER — LEVOTHYROXINE SODIUM 137 MCG PO TABS
137.0000 ug | ORAL_TABLET | Freq: Every day | ORAL | Status: AC
  Administered 2021-03-16 – 2021-03-20 (×5): 137 ug via ORAL
  Filled 2021-03-15 (×5): qty 1

## 2021-03-15 MED ORDER — SIMVASTATIN 20 MG PO TABS
20.0000 mg | ORAL_TABLET | Freq: Every day | ORAL | Status: AC
  Administered 2021-03-15 – 2021-03-19 (×5): 20 mg via ORAL
  Filled 2021-03-15 (×7): qty 1

## 2021-03-15 MED ORDER — QUETIAPINE FUMARATE 200 MG PO TABS
200.0000 mg | ORAL_TABLET | Freq: Every day | ORAL | Status: DC
  Administered 2021-03-15: 200 mg via ORAL
  Filled 2021-03-15 (×4): qty 1

## 2021-03-15 MED ORDER — HYDROXYZINE HCL 25 MG PO TABS: 25.0000 mg | ORAL_TABLET | Freq: Three times a day (TID) | ORAL | Status: DC | PRN

## 2021-03-15 MED ORDER — MAGNESIUM HYDROXIDE 400 MG/5ML PO SUSP: 30.0000 mL | Freq: Every day | ORAL | Status: DC | PRN

## 2021-03-15 MED ORDER — PAROXETINE HCL 30 MG PO TABS
45.0000 mg | ORAL_TABLET | Freq: Every day | ORAL | Status: DC
  Administered 2021-03-15: 45 mg via ORAL
  Filled 2021-03-15 (×2): qty 1.5
  Filled 2021-03-15: qty 5
  Filled 2021-03-15: qty 1.5

## 2021-03-15 MED ORDER — PROGESTERONE 200 MG PO CAPS
200.0000 mg | ORAL_CAPSULE | Freq: Every day | ORAL | Status: DC
  Administered 2021-03-16 – 2021-03-20 (×5): 200 mg via ORAL
  Filled 2021-03-15 (×7): qty 1

## 2021-03-15 MED ORDER — ALUM & MAG HYDROXIDE-SIMETH 200-200-20 MG/5ML PO SUSP: 30.0000 mL | ORAL | Status: DC | PRN

## 2021-03-15 MED ORDER — LAMOTRIGINE 200 MG PO TABS
200.0000 mg | ORAL_TABLET | Freq: Every day | ORAL | Status: AC
  Administered 2021-03-15 – 2021-03-20 (×6): 200 mg via ORAL
  Filled 2021-03-15 (×3): qty 1
  Filled 2021-03-15: qty 2
  Filled 2021-03-15 (×3): qty 1

## 2021-03-15 MED ORDER — TRAZODONE HCL 50 MG PO TABS: 50.0000 mg | ORAL_TABLET | Freq: Every evening | ORAL | Status: DC | PRN

## 2021-03-15 MED ORDER — TRAZODONE HCL 100 MG PO TABS
200.0000 mg | ORAL_TABLET | Freq: Every evening | ORAL | Status: DC | PRN
  Administered 2021-03-15: 200 mg via ORAL
  Filled 2021-03-15 (×3): qty 2

## 2021-03-15 MED ORDER — ACETAMINOPHEN 325 MG PO TABS
650.0000 mg | ORAL_TABLET | Freq: Four times a day (QID) | ORAL | Status: DC | PRN
  Administered 2021-03-17 – 2021-03-20 (×4): 650 mg via ORAL
  Filled 2021-03-15 (×4): qty 2

## 2021-03-15 NOTE — ED Notes (Signed)
1010 Writer reached out to [employer] at [place] due to not being able to get someone to answer phone at [facility]. He included Clinical research associate in [facility] chat. After several messages it has been determined she will receive inpatient admission. We are presently waiting for [facility] to determine if she will be accepted there or out sourced.
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
## 2021-03-15 NOTE — ED Notes (Signed)
Report given to BH  
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
## 2021-03-15 NOTE — ED Notes (Signed)
Pt's belongs secured. Pt stated that she fells good. Pt has a great disposition and easily directable and cooperative

## 2021-03-15 NOTE — ED Notes (Signed)
TTS in process 

## 2021-03-15 NOTE — Progress Notes (Signed)
Pt accepted to [facility] 305-1    Patient meets inpatient criteria per Dr. Lucianne Muss.    Dr.Amy Mason Jim is the attending provider.    Call report to [phone]    Delman Kitten, RN @ [facility] notified.     Pt scheduled  to arrive at [facility] today. Pt's bed is available now.   Damita Dunnings, MSW, LCSW-A  3:01 PM 03/15/2021
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
## 2021-03-15 NOTE — ED Notes (Signed)
2 bags of personal belongs were sent with pt to [facility]

## 2021-03-15 NOTE — ED Notes (Signed)
Print production planner - Transfer to [employer]

## 2021-03-15 NOTE — ED Provider Notes (Signed)
Signout note  60 year old lady presenting to ER with history of bipolar disorder with concern about being poisoned.  Concern patient is acutely psychotic, paranoid.  Laboratory work is within normal limits.  TTS has evaluated patient and recommended inpatient placement.  Has been excepted to [facility].  Patient is medically cleared for further psychiatric care.   Milagros Loll, MD 03/15/21 1319

## 2021-03-15 NOTE — Tx Team (Signed)
Initial Treatment Plan 03/15/2021 6:22 PM Lindsay [name] TWS:[id number]    PATIENT STRESSORS: Health problems   Marital or family conflict     PATIENT STRENGTHS: Motivation for treatment/growth  Supportive family/friends    PATIENT IDENTIFIED PROBLEMS: Paranoia  Anxiety  Depression                 DISCHARGE CRITERIA:  Adequate post-discharge living arrangements Motivation to continue treatment in a less acute level of care Safe-care adequate arrangements made  PRELIMINARY DISCHARGE PLAN: Attend aftercare/continuing care group Outpatient therapy Return to previous living arrangement  PATIENT/FAMILY INVOLVEMENT: This treatment plan has been presented to and reviewed with the patient, Lindsay [name], and/or family member.  The patient and family have been given the opportunity to ask questions and make suggestions.  Clarene Critchley, RN 03/15/2021, 6:22 PM

## 2021-03-15 NOTE — BHH Group Notes (Signed)
Adult Psychoeducational Group Note  Date:  03/15/2021 Time:  11:36 PM  Group Topic/Focus:  Managing Feelings:   The focus of this group is to identify what feelings patients have difficulty handling and develop a plan to handle them in a healthier way upon discharge.  Participation Level:  Active  Participation Quality:  Attentive, Intrusive, and Monopolizing  Affect:  Anxious  Cognitive:  Alert  Insight: Good  Engagement in Group:  Engaged and Off Topic  Modes of Intervention:  Discussion  Additional Comments  Jacalyn Lefevre 03/15/2021, 11:36 PM
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
## 2021-03-15 NOTE — ED Notes (Signed)
Pt stated that she was doing ok and thanked me for checking on her.

## 2021-03-15 NOTE — Discharge Instructions (Signed)
Patient discharging [facility]

## 2021-03-16 DIAGNOSIS — F312 Bipolar disorder, current episode manic severe with psychotic features: Principal | ICD-10-CM | POA: Diagnosis present

## 2021-03-16 LAB — LIPID PANEL
Cholesterol: 230 mg/dL — ABNORMAL HIGH (ref 0–200)
HDL: 92 mg/dL (ref >40)
LDL Cholesterol: 120 mg/dL — ABNORMAL HIGH (ref 0–99)
Total CHOL/HDL Ratio: 2.5 RATIO
Triglycerides: 90 mg/dL (ref <150)
VLDL: 18 mg/dL (ref 0–40)

## 2021-03-16 LAB — TSH: TSH: 46.845 u[IU]/mL — ABNORMAL HIGH (ref 0.350–4.500)

## 2021-03-16 MED ORDER — ESTRADIOL 1 MG PO TABS
0.5000 mg | ORAL_TABLET | Freq: Every day | ORAL | Status: DC
  Administered 2021-03-16 – 2021-03-21 (×6): 0.5 mg via ORAL
  Filled 2021-03-16: qty 1
  Filled 2021-03-16 (×4): qty 0.5
  Filled 2021-03-16: qty 1
  Filled 2021-03-16 (×4): qty 0.5

## 2021-03-16 MED ORDER — PAROXETINE HCL 30 MG PO TABS
30.0000 mg | ORAL_TABLET | Freq: Every day | ORAL | Status: DC
  Administered 2021-03-16 – 2021-03-17 (×2): 30 mg via ORAL
  Filled 2021-03-16: qty 1
  Filled 2021-03-16: qty 3
  Filled 2021-03-16 (×3): qty 1

## 2021-03-16 MED ORDER — HYDROXYZINE HCL 25 MG PO TABS: 25.0000 mg | ORAL_TABLET | Freq: Four times a day (QID) | ORAL | Status: AC | PRN

## 2021-03-16 MED ORDER — ADULT MULTIVITAMIN W/MINERALS CH
1.0000 | ORAL_TABLET | Freq: Every day | ORAL | Status: DC
  Administered 2021-03-16 – 2021-03-21 (×6): 1 via ORAL
  Filled 2021-03-16 (×9): qty 1

## 2021-03-16 MED ORDER — LIOTHYRONINE SODIUM 5 MCG PO TABS
5.0000 ug | ORAL_TABLET | Freq: Every day | ORAL | Status: DC
  Filled 2021-03-16 (×2): qty 1

## 2021-03-16 MED ORDER — THIAMINE HCL 100 MG PO TABS
100.0000 mg | ORAL_TABLET | Freq: Every day | ORAL | Status: DC
  Administered 2021-03-17 – 2021-03-21 (×5): 100 mg via ORAL
  Filled 2021-03-16 (×7): qty 1

## 2021-03-16 MED ORDER — QUETIAPINE FUMARATE 400 MG PO TABS
500.0000 mg | ORAL_TABLET | Freq: Every day | ORAL | Status: DC
  Administered 2021-03-16 – 2021-03-17 (×2): 500 mg via ORAL
  Filled 2021-03-16 (×5): qty 1

## 2021-03-16 MED ORDER — LOPERAMIDE HCL 2 MG PO CAPS: 2.0000 mg | ORAL_CAPSULE | ORAL | Status: AC | PRN

## 2021-03-16 MED ORDER — LORAZEPAM 1 MG PO TABS: 1.0000 mg | ORAL_TABLET | Freq: Four times a day (QID) | ORAL | Status: AC | PRN

## 2021-03-16 MED ORDER — ONDANSETRON 4 MG PO TBDP: 4.0000 mg | ORAL_TABLET | Freq: Four times a day (QID) | ORAL | Status: AC | PRN

## 2021-03-16 NOTE — Progress Notes (Signed)
Adult Psychoeducational Group Note  Date:  03/16/2021 Time:  10:57 PM  Group Topic/Focus:  Wrap-Up Group:   The focus of this group is to help patients review their daily goal of treatment and discuss progress on daily workbooks.  Participation Level:  Active  Participation Quality:  Appropriate and Attentive  Affect:  Appropriate  Cognitive:  Alert and Appropriate  Insight: Appropriate and Good  Engagement in Group:  Engaged  Modes of Intervention:  Discussion  Additional Comments: This Clinical research associate facilitated a Firefighter- Up group to support the patient with increasing awareness of their emotions and discuss their goals, progress, and objectives and how group skills were demonstrated on this date. Review and discuss patient's goals providing guidelines for improving their ability to engage in self-reflection. Pt self-reporting has an overall day of 7.5 out of 10 on this date. End of Wrap-Up Group progress note.   Nicoletta Dress 03/16/2021, 10:57 PM
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
## 2021-03-16 NOTE — Progress Notes (Signed)
     03/15/21 2300  Psych Admission Type (Psych Patients Only)  Admission Status Voluntary  Psychosocial Assessment  Patient Complaints Anxiety;Depression;Insomnia  Eye Contact Brief  Facial Expression Animated  Affect Anxious  Speech Logical/coherent  Interaction Assertive  Motor Activity Restless;Fidgety  Appearance/Hygiene In scrubs  Behavior Characteristics Cooperative  Mood Depressed;Anxious  Thought Process  Coherency WDL  Content Paranoia  Delusions Paranoid  Perception Derealization  Hallucination None reported or observed  Judgment Impaired  Confusion None  Danger to Self  Current suicidal ideation? Denies  Danger to Others  Danger to Others None reported or observed

## 2021-03-16 NOTE — Group Note (Signed)
  LCSW Group Therapy Note  03/16/2021  10-11am  Type of Therapy and Topic:  Group Therapy: "My Mental Health"  Participation Level:  Active   Description of Group:   In this group, patients were asked four questions in order to generate discussion around the idea of mental illness and/or substance addiction being medical problems: In one sentence describe the current state of your mental health or substance use. How much do you feel similar to or different from others? Do you tend to identify with other people or compare yourself to them?  In a word or sentence, share what you desire your mental health or substance use to be moving forward.  Discussion was held that led to the conclusion that comparing ourselves to others is not healthy, but identifying with the elements of their issues that are similar to ours is helpful.    Therapeutic Goals:  Patients will identify their feelings about their current mental health/substance use problems. Patients will describe how they feel similar to or different from others, and whether they tend to identify with or compare themselves to other people with the same issues. Patients will explore the differences in these concepts and how a change of mindset about mental health/substance use can help with reaching recovery goals. Patients will think about and share what their recovery goals are, in terms of mental health and/or substance use.  Summary of Patient Progress:  The patient shared that she feels the current state of her mental health is "very good."  She was manic during group, especially evidenced by being hyperverbal and intrusive.  When another patient was given 30 seconds to speak, she took it upon herself to tell him when she felt his 30 seconds was up.  She had to be admonished by other patients and group leader to stay in her boundaries.  Therapeutic Modalities:   Processing Psychoeducation  Lynnell Chad, MSW, LCSW

## 2021-03-16 NOTE — Progress Notes (Signed)
   03/16/21 2230  Psych Admission Type (Psych Patients Only)  Admission Status Voluntary  Psychosocial Assessment  Patient Complaints Anxiety  Eye Contact Brief  Facial Expression Animated  Affect Anxious  Speech Logical/coherent  Interaction Assertive  Motor Activity Fidgety;Restless  Appearance/Hygiene Unremarkable  Behavior Characteristics Appropriate to situation  Mood Anxious  Thought Process  Coherency WDL  Content Paranoia  Delusions Paranoid  Perception Derealization  Hallucination None reported or observed  Judgment Impaired  Confusion None  Danger to Self  Current suicidal ideation? Denies  Danger to Others  Danger to Others None reported or observed

## 2021-03-16 NOTE — [facility] Suicide Risk Assessment (Signed)
[facility] Admission Suicide Risk Assessment   Nursing information obtained from:  Patient Demographic factors:  Unemployed, Caucasian Current Mental Status:  mania and paranoia Loss Factors:  Financial problems / change in socioeconomic status, Decline in physical health; divorcing Historical Factors:  previous psychiatric diagnoses/treatments Risk Reduction Factors:  Positive social support  Total Time Spent in Direct Patient Care:  I personally spent 45 minutes on the unit in direct patient care. The direct patient care time included face-to-face time with the patient, reviewing the patient's chart, communicating with other professionals, and coordinating care. Greater than 50% of this time was spent in counseling or coordinating care with the patient regarding goals of hospitalization, psycho-education, and discharge planning needs.  Principal Problem: Bipolar I disorder, current or most recent episode manic, with psychotic features (HCC) Diagnosis:  Principal Problem:   Bipolar I disorder, current or most recent episode manic, with psychotic features (HCC)  Subjective Data: The patient is a 60y/o female with self-reported h/o bipolar I d/o, OCD, and ADHD, who initially presented to [facility] ED for belief that she was being poisoned by her husband. After medical clearance she was assessed by psychiatry and then transferred to [facility] while awaiting a voluntary admission at [facility].   On assessment today the patient is manic, hyperverbal, and tangential making history difficult to obtain. She states that for the last 2 weeks she has believed that her husband is trying to poison her by putting something in the water she drinks (water tastes funny to her) or by putting something in her car that is coming out of the vents causing her to have confusion and blurry vision. She states that she was becoming more independent in her marriage and decided 1 month ago to seek divorce. She believes that  her husband is attempting to poison her so she will "not get his money" in a divorce settlement. She denies AVH, ideas of reference or first rank symptoms. She admits that she has had psychosis in the past associated with her bipolar illness and describes a remote incidence of believing she had killed someone in the past. She does not believe that her current fear of being poisoned is a delusion. She admits to recent poor sleep and states that for several weeks prior to admission she had physical symptoms that she thought were either related to possible COVID or poisoning. She states she tested negative for COVID but was having low energy, poor focus, anhedonia, "grief" over the end of her marriage, low mood, poor appetite, irritability, and poor taste. She states that since coming to the ED she has not felt depressed and now recognizes that she is instead mood elevated. She attributes this abrupt change in her mood to not having received her regular psychotropic medications while she was in the ED. She states she usually takes Seroquel 450mg  qhs, Paxil 45mg  daily, Lamictal 200mg  qd, and Trazodone 200mg  qhs. In addition she has been taking Adderall bid for ADHD and "to manage my depression" and does not think stimulant use has contributed to present mood elevation. She reports that she has tried and failed multiiple antipsychotic and antidepressant trials over the years and her current medication regimen works for her. She states that if she is on a lower dose of Paxil her OCD issues are worse and she is resistant to medication changes. She denies drug or alcohol history. She denies current SI or HI and denies previous suicide attempts. She states she was admitted in 1992 for OCD  issues in [place] but has not had subsequent psychiatric admissions. She reports PMH of hypothyroidism. See H&P for additional details.  Continued Clinical Symptoms:    The "Alcohol Use Disorders Identification Test", Guidelines for  Use in Primary Care, Second Edition.  World Science writer [facility]). Score between 0-7:  no or low risk or alcohol related problems. Score between 8-15:  moderate risk of alcohol related problems. Score between 16-19:  high risk of alcohol related problems. Score 20 or above:  warrants further diagnostic evaluation for alcohol dependence and treatment.  CLINICAL FACTORS:  Bipolar Disorder:  manic with psychotic features Previous Psychiatric Diagnoses and Treatments  Musculoskeletal: Strength & Muscle Tone: within normal limits Gait & Station: normal, steady Patient leans: N/A  Psychiatric Specialty Exam: Physical Exam Vitals reviewed.  HENT:     Head: Normocephalic.  Pulmonary:     Effort: Pulmonary effort is normal.  Neurological:     General: No focal deficit present.     Mental Status: She is alert.    Review of Systems - see H&P  Blood pressure 99/69, pulse 62, temperature 98 F (36.7 C), temperature source Oral, resp. rate 20, height 5\' 6"  (1.676 m), weight 88.5 kg, SpO2 100 %.Body mass index is 31.47 kg/m.  General Appearance:  casually dressed in scrubs, adequate hygiene  Eye Contact:  Good  Speech:  Clear and Coherent and Pressured, verbose  Volume:  Normal  Mood:   anxious and elevated  Affect:   elevated , expansive  Thought Process:  tangential, circumstantial with occasional derailment  Orientation:  Full (Time, Place, and Person)  Thought Content:   Has persecutory and paranoid delusions of being poisoned and possible gustatory hallucination of altered taste from perceived poisoning; Denies AVH, ideas of reference or first rank symptoms  Suicidal Thoughts:  No  Homicidal Thoughts:  No  Memory:  Recent;   Fair  Judgement:  Impaired  Insight:  Lacking  Psychomotor Activity:  Increased - animated when she speaks  Concentration:  Concentration: Poor and Attention Span: Poor  Recall:  of Knowledge:  Good  Language:  Good  Akathisia:  Negative   Assets:  Communication Skills Desire for Improvement Physical Health Resilience Social Support  ADL's:  Intact  Cognition:  WNL   COGNITIVE FEATURES THAT CONTRIBUTE TO RISK:  Thought constriction (tunnel vision)    SUICIDE RISK:   Mild:  There are no identifiable plans, no associated intent, few other risk factors, and identifiable protective factors, including available and accessible social support.  PLAN OF CARE: Patient admitted voluntarily to [facility]. Admission labs reviewed: UDS positive for amphetamines (on Adderall); UA WNL; CBC WNL; CMP WNL except for BUN 30, creatinine 1.28, Ca+ 10.6, total protein 9.3, Albumin 5.5 GFR 48; Ethylene glycol pending, Heavy metal pending; ETOH 12; Tylenol <10, Salicylate <7; respiratory panel negative; Cholesterol 230, triglycerides 90, HDL 92, LDL 120; TSH pending. Will order EKG and will place patient on CIWA for possible alcohol withdrawal monitoring based on ETOH noted on admission labs.   Discussed with the patient that she appears to have bipolar mania with psychosis. She agrees to restart Seroquel and to dose increase to 500mg  qhs. I advised that given her mania that we reduce her Paxil to 30mg  tonight and start slowly trying to taper down on her SSRI in the event it is contributing to mania. She agrees with this plan but is resistant to come off Paxil completely for fear her OCD will worsen. She wishes to  continue Trazodone 200mg  qhs and Lamictal 200mg  daily. She confirms she has not missed more than 1 day of Lamictal so this can be resumed. She understands her stimulant will be discontinued and she was counseled on the risks that stimulants play in contributing to mania. She was advised not to resume stimulants after discharge. We will verify and restart any other home meds.   I certify that inpatient services furnished can reasonably be expected to improve the patient's condition.   , MD, FAPA 03/16/2021, 5:57 PM

## 2021-03-16 NOTE — Plan of Care (Signed)
  Problem: Education: Goal: Emotional status will improve Outcome: Not Progressing Goal: Mental status will improve Outcome: Not Progressing   Problem: Activity: Goal: Will verbalize the importance of balancing activity with adequate rest periods Outcome: Not Progressing   Problem: Education: Goal: Will be free of psychotic symptoms Outcome: Not Progressing   Problem: Coping: Goal: Coping ability will improve Outcome: Not Progressing

## 2021-03-16 NOTE — Progress Notes (Signed)
    Sent message at [phone] to pharmacy requesting missing dose of levothyroxine.
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
## 2021-03-17 MED ORDER — LIOTHYRONINE SODIUM 5 MCG PO TABS
5.0000 ug | ORAL_TABLET | Freq: Every day | ORAL | Status: DC
  Administered 2021-03-17 – 2021-03-21 (×5): 5 ug via ORAL
  Filled 2021-03-17 (×7): qty 1

## 2021-03-17 NOTE — Progress Notes (Signed)
Pt did not attend psycho-ed group. 

## 2021-03-17 NOTE — [facility] Group Notes (Signed)
[facility] Group Notes:  (Nursing/MHT/Case Management/Adjunct)  Date:  03/17/2021  Time:  8:20 PM  Type of Therapy:  Group Therapy  Participation Level:  Active  Participation Quality:  Attentive  Affect:  Appropriate  Cognitive:  Appropriate  Insight:  Improving  Engagement in Group:  Developing/Improving  Modes of Intervention:  Activity and Discussion  Summary of Progress/Problems:  Lorita Officer 03/17/2021, 8:20 PM

## 2021-03-17 NOTE — Group Note (Signed)
[facility] Group Notes: (Clinical Social Work)   03/17/2021      Type of Therapy:  Group Therapy   Participation Level:  Did Not Attend - was invited both individually by MHT and by overhead announcement, chose not to attend.   Ambrose Mantle, LCSW 03/17/2021, 4:08 PM

## 2021-03-17 NOTE — Progress Notes (Signed)
Pt did not attend orientation/goals group. 

## 2021-03-17 NOTE — Progress Notes (Signed)
   03/17/21 2242  Psych Admission Type (Psych Patients Only)  Admission Status Voluntary  Psychosocial Assessment  Patient Complaints None  Eye Contact Fair  Facial Expression Animated  Affect Appropriate to circumstance  Speech Logical/coherent  Interaction Assertive  Motor Activity Fidgety  Appearance/Hygiene Unremarkable  Behavior Characteristics Appropriate to situation  Mood Anxious;Pleasant  Thought Process  Coherency WDL  Content Paranoia  Delusions Paranoid  Perception Derealization  Hallucination None reported or observed  Judgment Impaired  Confusion None  Danger to Self  Current suicidal ideation? Denies  Danger to Others  Danger to Others None reported or observed   

## 2021-03-17 NOTE — Progress Notes (Signed)
EKG results placed on the outside of pt's shadow chart  Sinus bradycardia  Vent rate 52 BPM  Otherwise normal ECG  QT/Qtc- Baz  432/401 ms

## 2021-03-17 NOTE — Progress Notes (Addendum)
[facility] MD Progress Note  03/17/2021 10:44 AM Lindsay [name]  MRN:  [id number] Subjective:    Lindsay [name] is a 61 yr old female who presents with paranoia accusing her husband of poisoning her and evidence of acute mania. PPHx is significant for Bipolar Disorder.  Case was discussed in the multidisciplinary team. MAR was reviewed and patient was compliant with medications. Per nursing, yesterday the patient was intrusive, labile, fidgety, and paranoid appearing. In group Lindsay [name] was reportedly hyperverbal and intrusive. Lindsay [name] did not require PRNs.  Lindsay [name] reports that Lindsay [name] is doing well today.  Lindsay [name] reports that Lindsay [name] slept well last night.  Lindsay [name] reports that her appetite is doing good.  Lindsay [name] reports no SI, HI, or AVH.  Lindsay [name] reports no first rank symptoms, ideas of reference, or paranoia on the unit. With the attending, Lindsay [name] reported residual belief that her husband is trying to poison her.  When asked if Lindsay [name] thought anyone was out to get her Lindsay [name] stated no.  Lindsay [name] did mention that in the middle of the night Lindsay [name] was awoken by leg pain however this was controlled by Tylenol.  Lindsay [name] reports that this morning Lindsay [name] is a little groggy and discussed with her that this was very common whenever there is an increase in medications like Seroquel.  Discussed that this will likely pass within the next couple days and Lindsay [name] had no further concerns about it.  Lindsay [name] had no other concerns at present.   Principal Problem: Bipolar I disorder, current or most recent episode manic, with psychotic features (HCC) Diagnosis: Principal Problem:   Bipolar I disorder, current or most recent episode manic, with psychotic features (HCC)  Total Time Spent in Direct Patient Care:  I personally spent 30 minutes on the unit in direct patient care. The direct patient care time included face-to-face time with the patient, reviewing the patient's chart, communicating with other professionals, and coordinating care. Greater than 50% of this time was spent in  counseling or coordinating care with the patient regarding goals of hospitalization, psycho-education, and discharge planning needs.   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 Theressa Millard   Depression    OCD   Hypothyroidism     Past Surgical History:  Procedure Laterality Date   BLADDER SUSPENSION N/A 06/01/2015   Procedure: TRANSVAGINAL TAPE (TVT) PROCEDURE;  Surgeon: Osborn Coho, MD;  Location: [facility];  Service: Gynecology;  Laterality: N/A;   cesarean section times two      CYSTOSCOPY N/A 06/01/2015   Procedure: CYSTOSCOPY;  Surgeon: Osborn Coho, MD;  Location: [facility];  Service: Gynecology;  Laterality: N/A;   DIAGNOSTIC LAPAROSCOPY     DILATATION & CURRETTAGE/HYSTEROSCOPY WITH RESECTOCOPE N/A 06/01/2015   Procedure: DILATATION & CURETTAGE/HYSTEROSCOPY WITH RESECTOCOPE;  Surgeon: Osborn Coho, MD;  Location: [facility];  Service: Gynecology;  Laterality: N/A;   DILATION AND CURETTAGE OF UTERUS     FRACTURE SURGERY     on right heel    LAPAROSCOPIC GASTRIC SLEEVE RESECTION     Family History: History reviewed. No pertinent family history. Family Psychiatric  History: Maternal Grandmother and Multiple family members on both sides - Bipolar Disorder  Social History:  Social History   Substance and Sexual Activity  Alcohol Use Yes   Alcohol/week: 1.0 standard drink   Types: 1 Glasses  of wine per week   Comment: occ     Social History   Substance and Sexual Activity  Drug Use No    Social History   Socioeconomic History   Marital status: Married    Spouse name: Not on file   Number of children: Not on file   Years of education: Not on file   Highest education level: Not on file  Occupational History   Not on file  Tobacco Use   Smoking status: Never   Smokeless tobacco: Never  Substance  and Sexual Activity   Alcohol use: Yes    Alcohol/week: 1.0 standard drink    Types: 1 Glasses of wine per week    Comment: occ   Drug use: No   Sexual activity: Not on file  Other Topics Concern   Not on file  Social History Narrative   Not on file   Social Determinants of Health   Financial Resource Strain: Not on file  Food Insecurity: Not on file  Transportation Needs: Not on file  Physical Activity: Not on file  Stress: Not on file  Social Connections: Not on file    Sleep: Good  Appetite:  Good  Current Medications: Current Facility-Administered Medications  Medication Dose Route Frequency Provider Last Rate Last Admin   acetaminophen (TYLENOL) tablet 650 mg  650 mg Oral Q6H PRN Lamar Sprinkles, MD   650 mg at 03/17/21 0830   alum & mag hydroxide-simeth (MAALOX/MYLANTA) 200-200-20 MG/5ML suspension 30 mL  30 mL Oral Q4H PRN Lamar Sprinkles, MD       estradiol (ESTRACE) tablet 0.5 mg  0.5 mg Oral Daily Lamar Sprinkles, MD   0.5 mg at 03/17/21 8832   hydrOXYzine (ATARAX/VISTARIL) tablet 25 mg  25 mg Oral Q6H PRN Comer Locket, MD       lamoTRIgine (LAMICTAL) tablet 200 mg  200 mg Oral QHS Bobbitt, Shalon E, NP   200 mg at 03/16/21 2126   levothyroxine (SYNTHROID) tablet 137 mcg  137 mcg Oral Q0600 Bobbitt, Shalon E, NP   137 mcg at 03/17/21 5498   liothyronine (CYTOMEL) tablet 5 mcg  5 mcg Oral Daily Mason Jim, Gicela Schwarting E, MD   5 mcg at 03/17/21 2641   loperamide (IMODIUM) capsule 2-4 mg  2-4 mg Oral PRN Comer Locket, MD       LORazepam (ATIVAN) tablet 1 mg  1 mg Oral Q6H PRN Mason Jim, Garlin Batdorf E, MD       magnesium hydroxide (MILK OF MAGNESIA) suspension 30 mL  30 mL Oral Daily PRN Lamar Sprinkles, MD       multivitamin with minerals tablet 1 tablet  1 tablet Oral Daily Mason Jim, Manda Holstad E, MD   1 tablet at 03/17/21 0828   ondansetron (ZOFRAN-ODT) disintegrating tablet 4 mg  4 mg Oral Q6H PRN Mason Jim, Darienne Belleau E, MD       PARoxetine (PAXIL) tablet 30 mg  30 mg Oral QHS Lamar Sprinkles, MD   30 mg at 03/16/21 2145   progesterone (PROMETRIUM) capsule 200 mg  200 mg Oral QHS Bobbitt, Shalon E, NP   200 mg at 03/16/21 2146   QUEtiapine (SEROQUEL) tablet 500 mg  500 mg Oral QHS Lamar Sprinkles, MD   500 mg at 03/16/21 2146   simvastatin (ZOCOR) tablet 20 mg  20 mg Oral Q2000 Bobbitt, Shalon E, NP   20 mg at 03/16/21 2134   thiamine tablet 100 mg  100 mg Oral Daily Comer Locket, MD  100 mg at 03/17/21 6387   traZODone (DESYREL) tablet 200 mg  200 mg Oral QHS PRN Lamar Sprinkles, MD   200 mg at 03/15/21 2158    Lab Results:  Results for orders placed or performed during the hospital encounter of 03/15/21 (from the past 48 hour(s))  Lipid panel     Status: Abnormal   Collection Time: 03/16/21  6:55 AM  Result Value Ref Range   Cholesterol 230 (H) 0 - 200 mg/dL   Triglycerides 90 <564 mg/dL   HDL 92 >33 mg/dL   Total CHOL/HDL Ratio 2.5 RATIO   VLDL 18 0 - 40 mg/dL   LDL Cholesterol 295 (H) 0 - 99 mg/dL    Comment:        Total Cholesterol/HDL:CHD Risk Coronary Heart Disease Risk Table                     Men   Women  1/2 Average Risk   3.4   3.3  Average Risk       5.0   4.4  2 X Average Risk   9.6   7.1  3 X Average Risk  23.4   11.0        Use the calculated Patient Ratio above and the CHD Risk Table to determine the patient's CHD Risk.        ATP III CLASSIFICATION (LDL):  <100     mg/dL   Optimal  188-416  mg/dL   Near or Above                    Optimal  130-159  mg/dL   Borderline  606-301  mg/dL   High  >601     mg/dL   Very High Performed at [facility], [address] [place], [place] [address]   TSH     Status: Abnormal   Collection Time: 03/16/21  6:36 PM  Result Value Ref Range   TSH 46.845 (H) 0.350 - 4.500 uIU/mL    Comment: Performed by a 3rd Generation assay with a functional sensitivity of <=0.01 uIU/mL. Performed at [facility], [address], [place], [place] [address]     Blood  Alcohol level:  Lab Results  Component Value Date   ETH 12 (H) 03/15/2021    Metabolic Disorder Labs: No results found for: HGBA1C, MPG No results found for: PROLACTIN Lab Results  Component Value Date   CHOL 230 (H) 03/16/2021   TRIG 90 03/16/2021   HDL 92 03/16/2021   CHOLHDL 2.5 03/16/2021   VLDL 18 03/16/2021   LDLCALC 120 (H) 03/16/2021    Physical Findings: AIMS: Facial and Oral Movements Muscles of Facial Expression: None, normal Lips and Perioral Area: None, normal Jaw: None, normal Tongue: None, normal,Extremity Movements Upper (arms, wrists, hands, fingers): None, normal Lower (legs, knees, ankles, toes): None, normal, Trunk Movements Neck, shoulders, hips: None, normal, Overall Severity Severity of abnormal movements (highest score from questions above): None, normal Incapacitation due to abnormal movements: None, normal Patient's awareness of abnormal movements (rate only patient's report): No Awareness, Dental Status Current problems with teeth and/or dentures?: No Does patient usually wear dentures?: No  CIWA:  CIWA-Ar Total: 1 COWS:     Musculoskeletal: Strength & Muscle Tone: within normal limits Gait & Station: normal Patient leans: N/A  Psychiatric Specialty Exam:  Presentation  General Appearance: Appropriate for Environment; Casual  Eye Contact:Good  Speech:Clear and Coherent; Normal Rate -  no longer pressured or verbose  Speech Volume:Normal  Handedness: No data recorded  Mood and Affect  Mood:described as "okay" - less expansive today  Affect: calmer, polite   Thought Process  Thought Processes:Coherent; Goal Directed - more linear  Descriptions of Associations:Intact  Orientation:Full (Time, Place and Person)  Thought Content:Residual persectory delusion that her husband is trying to poison her; denies AVH, ideas of reference, first rank symptoms and is not grossly responding to internal/external stimuli on exam  History of  Schizophrenia/Schizoaffective disorder:No  Duration of Psychotic Symptoms:Less than six months  Hallucinations:Hallucinations: None  Ideas of Reference:None  Suicidal Thoughts:Suicidal Thoughts: No  Homicidal Thoughts:Homicidal Thoughts: No   Sensorium  Memory:Immediate Good; Recent Good  Judgment:Fair  Insight:Limited secondary to recent mania/psychosis   Executive Functions  Concentration:Improved  Attention Span:Fair  Recall:Good  Fund of Knowledge:Good  Language:Good   Psychomotor Activity  Psychomotor Activity:Psychomotor Activity: Normal   Assets  Assets:Communication Skills; Resilience; Physical Health; Social Support   Sleep  Sleep:Sleep: Fair Number of Hours of Sleep: 5.25    Physical Exam: Physical Exam Vitals and nursing note reviewed.  Constitutional:      General: Lindsay [name] is not in acute distress.    Appearance: Normal appearance. Lindsay [name] is normal weight. Lindsay [name] is not ill-appearing or toxic-appearing.  HENT:     Head: Normocephalic and atraumatic.  Pulmonary:     Effort: Pulmonary effort is normal.  Musculoskeletal:        General: Normal range of motion.  Neurological:     General: No focal deficit present.     Mental Status: Lindsay [name] is alert.   Review of Systems  Respiratory:  Negative for cough and shortness of breath.   Cardiovascular:  Negative for chest pain.  Gastrointestinal:  Negative for abdominal pain, constipation, diarrhea, nausea and vomiting.  Musculoskeletal:        Some Right leg pain controlled with Tylenol   Neurological:  Negative for weakness and headaches.  Psychiatric/Behavioral:  Negative for depression, hallucinations and suicidal ideas. The patient is not nervous/anxious.   Blood pressure (!) 94/47, pulse (!) 59, temperature 98.5 F (36.9 C), temperature source Oral, resp. rate 20, height 5\' 6"  (1.676 m), weight 88.5 kg, SpO2 100 %. Body mass index is 31.47 kg/m.   Treatment Plan Summary: Daily contact with  patient to assess and evaluate symptoms and progress in treatment   Lindsay [name] is a 60 yr old female who presents with paranoia accusing her husband of poisoning her and recent evidence of manic behaviors. PPHx is significant for Bipolar Disorder.   Lindsay [name] is a little/tired today but otherwise seems improved from yesterday.  This is most likely due to the increase in Seroquel and should resolve in the next day or two.  Lindsay [name] was also able to stay on track in our conversation and was not tangential or scattered in her thinking.  Given this improvement we will not make any medication changes at this time.  Pharmacy confirmed that patient has not been taking EstroGel since 2014 and has only been on the estradiol.  We will continue to monitor.   Bipolar I Disorder, current episode manic w/ psychotic features: -Continue Seroquel 500 mg QHS -Continue Paxil 30 mg QHS -Continue Lamictal 200 mg daily -Continue Trazodone 200 mg QHS PRN - EKG pending for monitoring of QTC   Elevated Creatinine 1.28  -Encourage increased fluids and recheck BMP tomorrow   Hypothyroidism: -Continue Levothyroxine 137 mcg daily -Continue Cytomel 5 mcg daily -Awaiting Free T4 and  T3 - TSH elevated at 46.845 (will consult medicine for additional recommendations once remainder of thyroid studies resulted)   Dyslipidemia: -Continue Simvastatin 20 mg daily   Menopause -Continue Estrace 0.5 mg   -Continue PRN's: Tylenol, Maalox, Atarax, Milk of Magnesia, Trazodone   Lauro Franklin, MD 03/17/2021, 10:44 AM

## 2021-03-17 NOTE — Progress Notes (Deleted)
   03/17/21 2242  Psych Admission Type (Psych Patients Only)  Admission Status Voluntary  Psychosocial Assessment  Patient Complaints None  Eye Contact Fair  Facial Expression Animated  Affect Appropriate to circumstance  Speech Logical/coherent  Interaction Assertive  Motor Activity Fidgety  Appearance/Hygiene Unremarkable  Behavior Characteristics Appropriate to situation  Mood Anxious;Pleasant  Thought Process  Coherency WDL  Content Paranoia  Delusions Paranoid  Perception Derealization  Hallucination None reported or observed  Judgment Impaired  Confusion None  Danger to Self  Current suicidal ideation? Denies  Danger to Others  Danger to Others None reported or observed

## 2021-03-17 NOTE — Progress Notes (Signed)
   03/17/21 1600  Psych Admission Type (Psych Patients Only)  Admission Status Voluntary  Psychosocial Assessment  Patient Complaints Anxiety  Eye Contact Brief  Facial Expression Animated  Affect Anxious  Speech Logical/coherent  Interaction Assertive  Motor Activity Fidgety;Restless  Appearance/Hygiene Unremarkable  Behavior Characteristics Appropriate to situation  Mood Anxious  Thought Process  Coherency WDL  Content Paranoia  Delusions Paranoid  Perception Derealization  Hallucination None reported or observed  Judgment Impaired  Confusion None  Danger to Self  Current suicidal ideation? Denies  Danger to Others  Danger to Others None reported or observed

## 2021-03-18 ENCOUNTER — Encounter (HOSPITAL_COMMUNITY): Payer: Self-pay

## 2021-03-18 LAB — BASIC METABOLIC PANEL
Anion gap: 5 (ref 5–15)
BUN: 17 mg/dL (ref 6–20)
CO2: 32 mmol/L (ref 22–32)
Calcium: 9.1 mg/dL (ref 8.9–10.3)
Chloride: 108 mmol/L (ref 98–111)
Creatinine, Ser: 1.2 mg/dL — ABNORMAL HIGH (ref 0.44–1.00)
GFR, Estimated: 52 mL/min — ABNORMAL LOW (ref >60)
Glucose, Bld: 86 mg/dL (ref 70–99)
Potassium: 3.9 mmol/L (ref 3.5–5.1)
Sodium: 145 mmol/L (ref 135–145)

## 2021-03-18 LAB — T4, FREE: Free T4: 0.45 ng/dL — ABNORMAL LOW (ref 0.61–1.12)

## 2021-03-18 MED ORDER — QUETIAPINE FUMARATE 50 MG PO TABS
550.0000 mg | ORAL_TABLET | Freq: Every day | ORAL | Status: DC
  Administered 2021-03-18: 550 mg via ORAL
  Filled 2021-03-18 (×4): qty 1

## 2021-03-18 MED ORDER — TRAZODONE HCL 100 MG PO TABS
100.0000 mg | ORAL_TABLET | Freq: Every evening | ORAL | Status: DC | PRN
  Administered 2021-03-20: 100 mg via ORAL
  Filled 2021-03-18: qty 1

## 2021-03-18 MED ORDER — PAROXETINE HCL 20 MG PO TABS
25.0000 mg | ORAL_TABLET | Freq: Every day | ORAL | Status: DC
  Administered 2021-03-18: 25 mg via ORAL
  Filled 2021-03-18 (×3): qty 0.5

## 2021-03-18 NOTE — Group Note (Signed)
Recreation Therapy Group Note   Group Topic:Coping Skills  Group Date: 03/18/2021 Start Time: 1000 End Time: 1045 Facilitators: Caroll Rancher, LRT/CTRS Location: [address] Dayroom  Goal Area(s) Addresses:  Patient will identify difference between positive and negative coping skills. Patient will identify positive coping skills. Patient will identify benefits of using positive coping skills post d/c.  Group Description:  Each patient was give a blank map diagram.  LRT and patients filled the first 8 boxes in together with different diagnosis or situations (anxiety, depression, mania, loneliness, abuse, hopelessness, lack of motivation and addition) in which coping skills would be needed.  Patients were given 15-20 minutes to come up with at least 3 copings for each one.   When time was up, LRT would bring the group together and start writing their responses on the board.  If anyone had any blank space on their paper, they could fill in the blank spots from what was identified on the board.   Affect/Mood: N/A   Participation Level: N/A   Participation Quality: N/A   Behavior: N/A   Speech/Thought Process: N/A   Insight: N/A   Judgement: N/A   Modes of Intervention: Guided Discussion and Worksheet   Patient Response to Interventions:  N/A   Education Outcome:  Acknowledges education and In group clarification offered    Clinical Observations/Individualized Feedback: Pt came in as group was wrapping up.  LRT gave pt a worksheet with the blank mind map on it and explained the activity.  Pt was appreciative of getting the sheet so she could fill it out.  LRT expressed to pt, the information would be left on the board for her to copy down if pt chose to.  Plan: Continue to engage patient in RT group sessions 2-3x/week.   Caroll Rancher, LRT/CTRS 03/18/2021 12:51 PM

## 2021-03-18 NOTE — Progress Notes (Signed)
   03/18/21 0633  Vital Signs  Pulse Rate 63  Pulse Rate Source Monitor  BP 93/60  BP Location Right Arm  BP Method Automatic  Patient Position (if appropriate) Standing  Oxygen Therapy  SpO2 100 %   D: Patient denies SI/HI/AVH. Pt. Denies anxiety and rated depression 6/10. A:  Patient took scheduled medicine.  Support and encouragement provided Routine safety checks conducted every 15 minutes. Patient  Informed to notify staff with any concerns.   R:  Safety maintained.

## 2021-03-18 NOTE — Group Note (Signed)
LCSW Group Therapy Note  LCSW Aftercare Discharge Planning Group Note  Type of Group and Topic: Psychoeducational Group: Discharge Planning  Participation Level: Active  Description of Group: Discharge planning group reviews patient's anticipated discharge plans and assists patients to anticipate and address any barriers to wellness/recovery in the community. Suicide prevention education is reviewed with patients in group.  Therapeutic Goals  1. Patients will state their anticipated discharge plan and mental health aftercare 2. Patients will identify potential barriers to wellness in the community setting 3. Patients will engage in problem solving, solution focused discussion of ways to anticipate and address barriers to wellness/recovery   Therapeutic Modalities: Motivational Interviewing  Felizardo Hoffmann, Theresia Majors 03/18/2021  2:07 PM

## 2021-03-18 NOTE — BH IP Treatment Plan (Signed)
Interdisciplinary Treatment and Diagnostic Plan Update  03/18/2021 Time of Session: 10:45am Lindsay [name] MRN: [id number]  Principal Diagnosis: Bipolar I disorder, current or most recent episode manic, with psychotic features ([place])  Secondary Diagnoses: Principal Problem:   Bipolar I disorder, current or most recent episode manic, with psychotic features ([place])   Current Medications:  Current Facility-Administered Medications  Medication Dose Route Frequency Provider Last Rate Last Admin   acetaminophen (TYLENOL) tablet 650 mg  650 mg Oral Q6H PRN Rosezetta Schlatter, MD   650 mg at 03/17/21 0830   alum & mag hydroxide-simeth (MAALOX/MYLANTA) 200-200-20 MG/5ML suspension 30 mL  30 mL Oral Q4H PRN Rosezetta Schlatter, MD       estradiol (ESTRACE) tablet 0.5 mg  0.5 mg Oral Daily Rosezetta Schlatter, MD   0.5 mg at 03/18/21 6433   hydrOXYzine (ATARAX/VISTARIL) tablet 25 mg  25 mg Oral Q6H PRN Harlow Asa, MD       lamoTRIgine (LAMICTAL) tablet 200 mg  200 mg Oral QHS Bobbitt, Shalon E, NP   200 mg at 03/17/21 2112   levothyroxine (SYNTHROID) tablet 137 mcg  137 mcg Oral Q0600 Bobbitt, Shalon E, NP   137 mcg at 03/18/21 2951   liothyronine (CYTOMEL) tablet 5 mcg  5 mcg Oral Daily Viann Fish E, MD   5 mcg at 03/18/21 8841   loperamide (IMODIUM) capsule 2-4 mg  2-4 mg Oral PRN Harlow Asa, MD       LORazepam (ATIVAN) tablet 1 mg  1 mg Oral Q6H PRN Nelda Marseille, Amy E, MD       magnesium hydroxide (MILK OF MAGNESIA) suspension 30 mL  30 mL Oral Daily PRN Rosezetta Schlatter, MD       multivitamin with minerals tablet 1 tablet  1 tablet Oral Daily Viann Fish E, MD   1 tablet at 03/18/21 0927   ondansetron (ZOFRAN-ODT) disintegrating tablet 4 mg  4 mg Oral Q6H PRN Viann Fish E, MD       PARoxetine (PAXIL) tablet 30 mg  30 mg Oral QHS Rosezetta Schlatter, MD   30 mg at 03/17/21 2112   progesterone (PROMETRIUM) capsule 200 mg  200 mg Oral QHS Bobbitt, Shalon E, NP   200 mg at 03/17/21 2113    QUEtiapine (SEROQUEL) tablet 500 mg  500 mg Oral QHS Rosezetta Schlatter, MD   500 mg at 03/17/21 2111   simvastatin (ZOCOR) tablet 20 mg  20 mg Oral Q2000 Bobbitt, Shalon E, NP   20 mg at 03/17/21 2113   thiamine tablet 100 mg  100 mg Oral Daily Nelda Marseille, Amy E, MD   100 mg at 03/18/21 6606   PTA Medications: Medications Prior to Admission  Medication Sig Dispense Refill Last Dose   [START ON 03/26/2021] amphetamine-dextroamphetamine (ADDERALL) 15 MG tablet Take 1 tablet by mouth 2 (two) times daily. 60 tablet 0 03/13/2021   [START ON 04/24/2021] amphetamine-dextroamphetamine (ADDERALL) 15 MG tablet Take 1 tablet by mouth 2 (two) times daily with breakfast and lunch. 60 tablet 0    Cholecalciferol 125 MCG (5000 UT) capsule Take 5,000 Units by mouth daily.   03/13/2021   estradiol (ESTRACE) 0.5 MG tablet Take 0.5 tablets by mouth daily.      Estradiol (ESTROGEL) 0.75 MG/1.25 GM (0.06%) topical gel Place 1.25 g onto the skin daily.   03/13/2021   lamoTRIgine (LAMICTAL) 200 MG tablet TAKE 1 TABLET(200 MG) BY MOUTH DAILY 90 tablet 3 03/13/2021   levothyroxine (SYNTHROID) 137 MCG tablet Take 137 mcg  by mouth daily before breakfast.   03/13/2021   liothyronine (CYTOMEL) 5 MCG tablet    03/13/2021   PARoxetine (PAXIL) 30 MG tablet TAKE 1&1/2 TABLET BY MOUTH DAILY 135 tablet 3 03/13/2021   progesterone (PROMETRIUM) 200 MG capsule Take 200 mg by mouth daily.   03/13/2021   QUEtiapine (SEROQUEL) 300 MG tablet TAKE 1 AND 1/2 TABLETS(450 MG) BY MOUTH AT BEDTIME 135 tablet 3 03/13/2021   simvastatin (ZOCOR) 20 MG tablet Take 20 mg by mouth at bedtime.   03/13/2021   traZODone (DESYREL) 100 MG tablet Take 2 tablets (200 mg total) by mouth at bedtime as needed for sleep. 180 tablet 3 03/13/2021   amphetamine-dextroamphetamine (ADDERALL) 15 MG tablet Take 1 tablet by mouth 2 (two) times daily. (Patient not taking: No sig reported) 60 tablet 0 Not Taking   [START ON 05/24/2021] amphetamine-dextroamphetamine (ADDERALL) 15 MG  tablet Take 1 tablet by mouth 2 (two) times daily. 60 tablet 0    vitamin B-12 (CYANOCOBALAMIN) 1000 MCG tablet Take 1,000 mcg by mouth daily.       Patient Stressors: Health problems   Marital or family conflict    Patient Strengths: Motivation for treatment/growth  Supportive family/friends   Treatment Modalities: Medication Management, Group therapy, Case management,  1 to 1 session with clinician, Psychoeducation, Recreational therapy.   Physician Treatment Plan for Primary Diagnosis: Bipolar I disorder, current or most recent episode manic, with psychotic features ([place]) Long Term Goal(s): Improvement in symptoms so as ready for discharge   Short Term Goals: Ability to identify changes in lifestyle to reduce recurrence of condition will improve Compliance with prescribed medications will improve  Medication Management: Evaluate patient's response, side effects, and tolerance of medication regimen.  Therapeutic Interventions: 1 to 1 sessions, Unit Group sessions and Medication administration.  Evaluation of Outcomes: Not Met  Physician Treatment Plan for Secondary Diagnosis: Principal Problem:   Bipolar I disorder, current or most recent episode manic, with psychotic features ([place])  Long Term Goal(s): Improvement in symptoms so as ready for discharge   Short Term Goals: Ability to identify changes in lifestyle to reduce recurrence of condition will improve Compliance with prescribed medications will improve     Medication Management: Evaluate patient's response, side effects, and tolerance of medication regimen.  Therapeutic Interventions: 1 to 1 sessions, Unit Group sessions and Medication administration.  Evaluation of Outcomes: Not Met   RN Treatment Plan for Primary Diagnosis: Bipolar I disorder, current or most recent episode manic, with psychotic features ([place]) Long Term Goal(s): Knowledge of disease and therapeutic regimen to maintain health will improve  Short  Term Goals: Ability to remain free from injury will improve, Ability to verbalize frustration and anger appropriately will improve, Ability to demonstrate self-control, Ability to participate in decision making will improve, Ability to verbalize feelings will improve, Ability to disclose and discuss suicidal ideas, Ability to identify and develop effective coping behaviors will improve, and Compliance with prescribed medications will improve  Medication Management: RN will administer medications as ordered by provider, will assess and evaluate patient's response and provide education to patient for prescribed medication. RN will report any adverse and/or side effects to prescribing provider.  Therapeutic Interventions: 1 on 1 counseling sessions, Psychoeducation, Medication administration, Evaluate responses to treatment, Monitor vital signs and CBGs as ordered, Perform/monitor CIWA, COWS, AIMS and Fall Risk screenings as ordered, Perform wound care treatments as ordered.  Evaluation of Outcomes: Not Met   LCSW Treatment Plan for Primary Diagnosis: Bipolar I disorder,  current or most recent episode manic, with psychotic features [facility]) Long Term Goal(s): Safe transition to appropriate next level of care at discharge, Engage patient in therapeutic group addressing interpersonal concerns.  Short Term Goals: Engage patient in aftercare planning with referrals and resources, Increase social support, Increase ability to appropriately verbalize feelings, Increase emotional regulation, Facilitate acceptance of mental health diagnosis and concerns, Facilitate patient progression through stages of change regarding substance use diagnoses and concerns, Identify triggers associated with mental health/substance abuse issues, and Increase skills for wellness and recovery  Therapeutic Interventions: Assess for all discharge needs, 1 to 1 time with Social worker, Explore available resources and support systems, Assess  for adequacy in community support network, Educate family and significant other(s) on suicide prevention, Complete Psychosocial Assessment, Interpersonal group therapy.  Evaluation of Outcomes: Not Met   Progress in Treatment: Attending groups: Yes. Participating in groups: Yes. Taking medication as prescribed: Yes. Toleration medication: Yes. Family/Significant other contact made: No, will contact:  Candie Echevaria, daughter Patient understands diagnosis: No. Discussing patient identified problems/goals with staff: Yes. Medical problems stabilized or resolved: Yes. Denies suicidal/homicidal ideation: Yes. Issues/concerns per patient self-inventory: No.  New problem(s) identified: No, Describe:  none reported  New Short Term/Long Term Goal(s):   medication stabilization, elimination of SI thoughts, development of comprehensive mental wellness plan.    Patient Goals:  Patient states that they would want to  Discharge Plan or Barriers:   Reason for Continuation of Hospitalization: Anxiety Delusions  Depression Medication stabilization  Estimated Length of Stay: 3-5 days   Scribe for Treatment Team: Zachery Conch, LCSW 03/18/2021 11:14 AM

## 2021-03-18 NOTE — Progress Notes (Signed)
The patient's positive event for the day is that she spoke with multiple doctors and staff. In particular, she said that one staff was very helpful in "validating" her feelings with regards to her relationship. Her goal for tomorrow is to get "more stabilized".

## 2021-03-18 NOTE — Progress Notes (Addendum)
[facility] MD Progress Note  03/18/2021 5:22 PM Lindsay [name]  MRN:  [id number] Subjective:    Lindsay [name] is a 60 yr old female who presents with paranoia accusing her husband of poisoning her and evidence of acute mania. PPHx is significant for Bipolar Disorder.  Case was discussed in the multidisciplinary team. MAR was reviewed and patient was compliant with medications. Per nursing, yesterday the patient was not a behavioral problem, but this morning she has been intrusive, fidgety, and paranoid appearing. In group she was reportedly hyperverbal and intrusive. She did not require PRNs.  She reports that she is doing well today but endorses some restlessness and boredom.  She reports that she slept well last night, without the use of trazodone.  She reports that the metallic taste in her mouth has returned, particularly when she drinks water and when she eats some foods, but overall her appetite is good.  She reports no SI, HI, or AVH.  She reports no first rank symptoms, ideas of reference, or paranoia on the unit. She continues to report residual belief that her husband is trying to poison her, but does not believe that he wants to kill her; rather, she believes that he wants to make her "confused."  When asked if she thought anyone was out to get her she stated no.  She had no other concerns at present.  She does acknowledge some continued mania, but agrees that it has improved.  She also acknowledges that she lost motivation to pick up her thyroid medication, so she has been noncompliant with this regimen for approximately 2 months.   Principal Problem: Bipolar I disorder, current or most recent episode manic, with psychotic features (HCC) Diagnosis: Principal Problem:   Bipolar I disorder, current or most recent episode manic, with psychotic features (H[facility]) Active Problems:   Hypothyroidism, acquired  Total Time Spent in Direct Patient Care:  I personally spent 35 minutes on the unit in direct  patient care. The direct patient care time included face-to-face time with the patient, reviewing the patient's chart, communicating with other professionals, and coordinating care. Greater than 50% of this time was spent in counseling or coordinating care with the patient regarding goals of hospitalization, psycho-education, and discharge planning needs.   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 Theressa Millard   Depression    OCD   Hypothyroidism     Past Surgical History:  Procedure Laterality Date   BLADDER SUSPENSION N/A 06/01/2015   Procedure: TRANSVAGINAL TAPE (TVT) PROCEDURE;  Surgeon: Osborn Coho, MD;  Location: [facility];  Service: Gynecology;  Laterality: N/A;   cesarean section times two      CYSTOSCOPY N/A 06/01/2015   Procedure: CYSTOSCOPY;  Surgeon: Osborn Coho, MD;  Location: [facility];  Service: Gynecology;  Laterality: N/A;   DIAGNOSTIC LAPAROSCOPY     DILATATION & CURRETTAGE/HYSTEROSCOPY WITH RESECTOCOPE N/A 06/01/2015   Procedure: DILATATION & CURETTAGE/HYSTEROSCOPY WITH RESECTOCOPE;  Surgeon: Osborn Coho, MD;  Location: [facility];  Service: Gynecology;  Laterality: N/A;   DILATION AND CURETTAGE OF UTERUS     FRACTURE SURGERY     on right heel    LAPAROSCOPIC GASTRIC SLEEVE RESECTION     Family History: History reviewed. No pertinent family history. Family Psychiatric  History: Maternal Grandmother and Multiple family members on  both sides - Bipolar Disorder  Social History:  Social History   Substance and Sexual Activity  Alcohol Use Yes   Alcohol/week: 1.0 standard drink   Types: 1 Glasses of wine per week   Comment: occ     Social History   Substance and Sexual Activity  Drug Use No    Social History   Socioeconomic History   Marital status: Married    Spouse name: Not on file    Number of children: Not on file   Years of education: Not on file   Highest education level: Not on file  Occupational History   Not on file  Tobacco Use   Smoking status: Never   Smokeless tobacco: Never  Substance and Sexual Activity   Alcohol use: Yes    Alcohol/week: 1.0 standard drink    Types: 1 Glasses of wine per week    Comment: occ   Drug use: No   Sexual activity: Not on file  Other Topics Concern   Not on file  Social History Narrative   Not on file   Social Determinants of Health   Financial Resource Strain: Not on file  Food Insecurity: Not on file  Transportation Needs: Not on file  Physical Activity: Not on file  Stress: Not on file  Social Connections: Not on file    Sleep: Good  Appetite:  Good  Current Medications: Current Facility-Administered Medications  Medication Dose Route Frequency Provider Last Rate Last Admin   acetaminophen (TYLENOL) tablet 650 mg  650 mg Oral Q6H PRN Lamar Sprinkles, MD   650 mg at 03/17/21 0830   alum & mag hydroxide-simeth (MAALOX/MYLANTA) 200-200-20 MG/5ML suspension 30 mL  30 mL Oral Q4H PRN Lamar Sprinkles, MD       estradiol (ESTRACE) tablet 0.5 mg  0.5 mg Oral Daily Lamar Sprinkles, MD   0.5 mg at 03/18/21 7106   hydrOXYzine (ATARAX/VISTARIL) tablet 25 mg  25 mg Oral Q6H PRN Comer Locket, MD       lamoTRIgine (LAMICTAL) tablet 200 mg  200 mg Oral QHS Bobbitt, Shalon E, NP   200 mg at 03/17/21 2112   levothyroxine (SYNTHROID) tablet 137 mcg  137 mcg Oral Q0600 Bobbitt, Shalon E, NP   137 mcg at 03/18/21 2694   liothyronine (CYTOMEL) tablet 5 mcg  5 mcg Oral Daily Bartholomew Crews E, MD   5 mcg at 03/18/21 8546   loperamide (IMODIUM) capsule 2-4 mg  2-4 mg Oral PRN Comer Locket, MD       LORazepam (ATIVAN) tablet 1 mg  1 mg Oral Q6H PRN Mason Jim, Kirstina Leinweber E, MD       magnesium hydroxide (MILK OF MAGNESIA) suspension 30 mL  30 mL Oral Daily PRN Lamar Sprinkles, MD       multivitamin with minerals tablet 1 tablet  1  tablet Oral Daily Mason Jim, Ela Moffat E, MD   1 tablet at 03/18/21 0927   ondansetron (ZOFRAN-ODT) disintegrating tablet 4 mg  4 mg Oral Q6H PRN Comer Locket, MD       PARoxetine (PAXIL) tablet 25 mg  25 mg Oral QHS Lamar Sprinkles, MD       progesterone (PROMETRIUM) capsule 200 mg  200 mg Oral QHS Bobbitt, Shalon E, NP   200 mg at 03/17/21 2113   QUEtiapine (SEROQUEL) tablet 550 mg  550 mg Oral QHS Lamar Sprinkles, MD       simvastatin (ZOCOR) tablet 20 mg  20 mg Oral Q2000 Bobbitt, Shalon  E, NP   20 mg at 03/17/21 2113   thiamine tablet 100 mg  100 mg Oral Daily Comer Locket, MD   100 mg at 03/18/21 1610    Lab Results:  Results for orders placed or performed during the hospital encounter of 03/15/21 (from the past 48 hour(s))  TSH     Status: Abnormal   Collection Time: 03/16/21  6:36 PM  Result Value Ref Range   TSH 46.845 (H) 0.350 - 4.500 uIU/mL    Comment: Performed by a 3rd Generation assay with a functional sensitivity of <=0.01 uIU/mL. Performed at [facility], [address], [place], [place] [address]   Basic metabolic panel     Status: Abnormal   Collection Time: 03/18/21  6:22 AM  Result Value Ref Range   Sodium 145 135 - 145 mmol/L   Potassium 3.9 3.5 - 5.1 mmol/L   Chloride 108 98 - 111 mmol/L   CO2 32 22 - 32 mmol/L   Glucose, Bld 86 70 - 99 mg/dL    Comment: Glucose reference range applies only to samples taken after fasting for at least 8 hours.   BUN 17 6 - 20 mg/dL   Creatinine, Ser 4.09 (H) 0.44 - 1.00 mg/dL   Calcium 9.1 8.9 - 81.1 mg/dL   GFR, Estimated 52 (L) >60 mL/min    Comment: (NOTE) Calculated using the CKD-EPI Creatinine Equation (2021)    Anion gap 5 5 - 15    Comment: Performed at [facility], [address], [place], [place] [address]  T4, free     Status: Abnormal   Collection Time: 03/18/21  6:22 AM  Result Value Ref Range   Free T4 0.45 (L) 0.61 - 1.12 ng/dL    Comment: (NOTE) Biotin ingestion  may interfere with free T4 tests. If the results are inconsistent with the TSH level, previous test results, or the clinical presentation, then consider biotin interference. If needed, order repeat testing after stopping biotin. Performed at [facility], [address], [place], [place] [address]     Blood Alcohol level:  Lab Results  Component Value Date   ETH 12 (H) 03/15/2021    Metabolic Disorder Labs: No results found for: HGBA1C, MPG No results found for: PROLACTIN Lab Results  Component Value Date   CHOL 230 (H) 03/16/2021   TRIG 90 03/16/2021   HDL 92 03/16/2021   CHOLHDL 2.5 03/16/2021   VLDL 18 03/16/2021   LDLCALC 120 (H) 03/16/2021    Physical Findings: AIMS: Facial and Oral Movements Muscles of Facial Expression: None, normal Lips and Perioral Area: None, normal Jaw: None, normal Tongue: None, normal,Extremity Movements Upper (arms, wrists, hands, fingers): None, normal Lower (legs, knees, ankles, toes): None, normal, Trunk Movements Neck, shoulders, hips: None, normal, Overall Severity Severity of abnormal movements (highest score from questions above): None, normal Incapacitation due to abnormal movements: None, normal Patient's awareness of abnormal movements (rate only patient's report): No Awareness, Dental Status Current problems with teeth and/or dentures?: No Does patient usually wear dentures?: No  CIWA:  CIWA-Ar Total: 1 COWS:     Musculoskeletal: Strength & Muscle Tone: within normal limits Gait & Station: normal Patient leans: N/A  Psychiatric Specialty Exam:  Presentation  General Appearance: Appropriate for Environment; Casual  Eye Contact:Good but does blink and squint at times during exam   Speech:Clear and Coherent; Normal Rate (Normal rate, but remains verbose)  - no longer pressured   Speech Volume:Normal  Handedness: No data recorded  Mood and Affect  Mood:described as "I'm good. Bored, but good" - appears less  expansive  Affect: polite, less elevated   Thought Process  Thought Processes:Coherent; Linear  - more linear  Descriptions of Associations:Intact  Orientation:Full (Time, Place and Person)  Thought Content:Residual persecutory delusion that her husband is trying to poison her; denies AVH, ideas of reference, first rank symptoms and is not grossly responding to internal/external stimuli on exam  History of Schizophrenia/Schizoaffective disorder:No  Duration of Psychotic Symptoms:Less than six months  Hallucinations:Hallucinations: None  Ideas of Reference:Denied  Suicidal Thoughts:Suicidal Thoughts: No  Homicidal Thoughts:Homicidal Thoughts: No   Sensorium  Memory:Immediate Good; Recent Good; Remote Good  Judgment:Fair  Insight:Fair   Executive Functions  Concentration:Improved  Attention Span:Fair  Recall:Good  Fund of Knowledge:Good  Language:Good   Psychomotor Activity  Psychomotor Activity:Fidgety but no acute akathisias or tremors noted   Assets  Assets:Communication Skills; Desire for Improvement; Resilience; Social Support   Sleep  Sleep: not documented  Physical Exam: Physical Exam Vitals and nursing note reviewed.  Constitutional:      General: She is not in acute distress.    Appearance: Normal appearance. She is normal weight. She is not ill-appearing or toxic-appearing.  HENT:     Head: Normocephalic and atraumatic.  Pulmonary:     Effort: Pulmonary effort is normal.  Musculoskeletal:        General: Normal range of motion.  Neurological:     General: No focal deficit present.     Mental Status: She is alert.   Review of Systems  Respiratory:  Negative for cough and shortness of breath.   Cardiovascular:  Negative for chest pain.  Gastrointestinal:  Negative for abdominal pain, constipation, diarrhea, nausea and vomiting.  Musculoskeletal:        Some Right leg pain controlled with Tylenol   Neurological:  Negative for  weakness and headaches.  Psychiatric/Behavioral:  Negative for depression, hallucinations and suicidal ideas. The patient is not nervous/anxious.   Blood pressure 93/60, pulse 63, temperature 97.6 F (36.4 C), resp. rate 20, height 5\' 6"  (1.676 m), weight 88.5 kg, SpO2 100 %. Body mass index is 31.47 kg/m.   Treatment Plan Summary: Daily contact with patient to assess and evaluate symptoms and progress in treatment   Lindsay [name] is a 60 yr old female who presents with paranoia accusing her husband of poisoning her and recent evidence of manic behaviors. PPHx is significant for Bipolar Disorder.   She is verbose, but her mania has improved over the course of her admission. Given continued mania and lack of trazodone use for sleep, we will make adjustments to her Seroquel.  In order to prevent discontinuation serotonergic symptoms from Paxil, we will slow the taper off.  Pharmacy confirmed that patient has not been taking EstroGel since 2014 and has only been on the estradiol.  We will continue to monitor.   Bipolar I Disorder, current episode manic w/ psychotic features: -Increase Seroquel to 550 mg QHS -Decrease Paxil to 25 mg QHS- monitoring for discontinuation symptoms -Continue Lamictal 200 mg daily -Reduce Trazodone to 100mg  qhs PRN since she was so sedated yesterday and monitor for insomnia - EKG: QTC 401 - will recheck with dose titration on Seroquel with EKG tomorrow   Elevated Creatinine, downtrending  1.20 this morning, down from 1.28 -Continue to encourage increased fluids and recheck BMP tomorrow, 9/20   Hypothyroidism: Patient admitted to medication noncompliance -Continue Levothyroxine 137 mcg daily -Continue Cytomel  5 mcg daily -Free T4 mildly decreased at 0.45; T3 pending- TSH elevated at 46.845 -Advised to follow-up with PCP   Dyslipidemia: -Continue Simvastatin 20 mg daily   Menopause -Continue Estrace 0.5 mg and progesterone 200 mg nightly   -Continue  PRN's: Tylenol, Maalox, Atarax, Milk of Magnesia, Trazodone   Courtney Cosby,  03/18/2021, 5:22 PM

## 2021-03-19 LAB — BASIC METABOLIC PANEL
Anion gap: 9 (ref 5–15)
BUN: 18 mg/dL (ref 6–20)
CO2: 32 mmol/L (ref 22–32)
Calcium: 9.3 mg/dL (ref 8.9–10.3)
Chloride: 103 mmol/L (ref 98–111)
Creatinine, Ser: 1.07 mg/dL — ABNORMAL HIGH (ref 0.44–1.00)
GFR, Estimated: 59 mL/min — ABNORMAL LOW (ref >60)
Glucose, Bld: 93 mg/dL (ref 70–99)
Potassium: 4 mmol/L (ref 3.5–5.1)
Sodium: 144 mmol/L (ref 135–145)

## 2021-03-19 LAB — HEMOGLOBIN A1C
Hgb A1c MFr Bld: 5.6 % (ref 4.8–5.6)
Mean Plasma Glucose: 114.02 mg/dL

## 2021-03-19 LAB — ETHYLENE GLYCOL: Ethylene Glycol Lvl: <5 mg/dL

## 2021-03-19 LAB — T3, FREE: T3, Free: 1.8 pg/mL — ABNORMAL LOW (ref 2.0–4.4)

## 2021-03-19 MED ORDER — PROPRANOLOL HCL 10 MG PO TABS
10.0000 mg | ORAL_TABLET | Freq: Two times a day (BID) | ORAL | Status: DC
  Administered 2021-03-19 – 2021-03-20 (×3): 10 mg via ORAL
  Filled 2021-03-19 (×9): qty 1

## 2021-03-19 MED ORDER — PAROXETINE HCL 20 MG PO TABS
20.0000 mg | ORAL_TABLET | Freq: Every day | ORAL | Status: AC
  Administered 2021-03-19 – 2021-03-20 (×2): 20 mg via ORAL
  Filled 2021-03-19 (×2): qty 1

## 2021-03-19 MED ORDER — QUETIAPINE FUMARATE 300 MG PO TABS
600.0000 mg | ORAL_TABLET | Freq: Every day | ORAL | Status: AC
  Administered 2021-03-19 – 2021-03-20 (×2): 600 mg via ORAL
  Filled 2021-03-19 (×2): qty 2

## 2021-03-19 NOTE — Progress Notes (Signed)
The patient learned today about her medications from group. She spoke with the doctor's today and is feeling better. Her goal is to get discharged on Thursday.

## 2021-03-19 NOTE — Progress Notes (Signed)
   03/19/21 0602  Vital Signs  Temp 97.9 F (36.6 C)  Temp Source Oral  Pulse Rate 62  Pulse Rate Source Monitor  BP 140/81  BP Location Right Arm  BP Method Automatic  Patient Position (if appropriate) Sitting  Oxygen Therapy  SpO2 99 %   D: Patient denies SI/HI/AVH. Patient denies both anxiety and depression. Pt. Out in open areas.  A:  Patient took scheduled medicine.  Support and encouragement provided Routine safety checks conducted every 15 minutes. Patient  Informed to notify staff with any concerns.   R:  Safety maintained.

## 2021-03-19 NOTE — Progress Notes (Deleted)
The patient states that what she learned today is to be more "selfish" and to work on getting better. Her goal for tomorrow is to be more selfish about taking care of herself.

## 2021-03-19 NOTE — Progress Notes (Addendum)
[place] MD Progress Note  03/19/2021 7:22 PM Lindsay [name]  MRN:  [id number] Subjective:    Lindsay [name] is a 60 yr old female who presents with paranoia accusing her husband of poisoning her and evidence of acute mania. PPHx is significant for Bipolar Disorder.  Case was discussed in the multidisciplinary team. MAR was reviewed and patient was compliant with medications. Per nursing, yesterday the patient was not a behavioral problem, but during afternoon group sessions, she was fidgety. She did not require PRNs.  She reports that she is doing well today.  She reports that she slept well last night, again without the use of trazodone.  She reports that her appetite is good.  She reports no SI, HI, or AVH.  She reports no first rank symptoms, ideas of reference, or paranoia on the unit.  The residual paranoia she initially endorsed toward her husband, she says "is now behind me."  She says that she does not believe that he is trying to poison her and has no physical concerns for safety, just emotional concerns as she feels as though he will still try to gaslight her.  She says that overall, she feels a lot less manic, but she still endorses some restlessness.  She had no other concerns at present.   Principal Problem: Bipolar I disorder, current or most recent episode manic, with psychotic features (HCC) Diagnosis: Principal Problem:   Bipolar I disorder, current or most recent episode manic, with psychotic features (HCC) Active Problems:   Hypothyroidism, acquired  Total Time Spent in Direct Patient Care:  I personally spent 35 minutes on the unit in direct patient care. The direct patient care time included face-to-face time with the patient, reviewing the patient's chart, communicating with other professionals, and coordinating care. Greater than 50% of this time was spent in counseling or coordinating care with the patient regarding goals of hospitalization, psycho-education, and discharge planning  needs.   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 Theressa Millard   Depression    OCD   Hypothyroidism     Past Surgical History:  Procedure Laterality Date   BLADDER SUSPENSION N/A 06/01/2015   Procedure: TRANSVAGINAL TAPE (TVT) PROCEDURE;  Surgeon: Osborn Coho, MD;  Location: [facility];  Service: Gynecology;  Laterality: N/A;   cesarean section times two      CYSTOSCOPY N/A 06/01/2015   Procedure: CYSTOSCOPY;  Surgeon: Osborn Coho, MD;  Location: [facility];  Service: Gynecology;  Laterality: N/A;   DIAGNOSTIC LAPAROSCOPY     DILATATION & CURRETTAGE/HYSTEROSCOPY WITH RESECTOCOPE N/A 06/01/2015   Procedure: DILATATION & CURETTAGE/HYSTEROSCOPY WITH RESECTOCOPE;  Surgeon: Osborn Coho, MD;  Location: [facility];  Service: Gynecology;  Laterality: N/A;   DILATION AND CURETTAGE OF UTERUS     FRACTURE SURGERY     on right heel    LAPAROSCOPIC GASTRIC SLEEVE RESECTION     Family History: History reviewed. No pertinent family history. Family Psychiatric  History: Maternal Grandmother and Multiple family members on both sides - Bipolar Disorder  Social History:  Social History   Substance and Sexual Activity  Alcohol Use Yes   Alcohol/week: 1.0 standard drink   Types: 1 Glasses of wine per week   Comment: occ     Social History   Substance and Sexual Activity  Drug Use  No    Social History   Socioeconomic History   Marital status: Married    Spouse name: Not on file   Number of children: Not on file   Years of education: Not on file   Highest education level: Not on file  Occupational History   Not on file  Tobacco Use   Smoking status: Never   Smokeless tobacco: Never  Substance and Sexual Activity   Alcohol use: Yes    Alcohol/week: 1.0 standard drink    Types: 1 Glasses of wine per week     Comment: occ   Drug use: No   Sexual activity: Not on file  Other Topics Concern   Not on file  Social History Narrative   Not on file   Social Determinants of Health   Financial Resource Strain: Not on file  Food Insecurity: Not on file  Transportation Needs: Not on file  Physical Activity: Not on file  Stress: Not on file  Social Connections: Not on file    Sleep: Good  Appetite:  Good  Current Medications: Current Facility-Administered Medications  Medication Dose Route Frequency Provider Last Rate Last Admin   acetaminophen (TYLENOL) tablet 650 mg  650 mg Oral Q6H PRN Lamar Sprinkles, MD   650 mg at 03/17/21 0830   alum & mag hydroxide-simeth (MAALOX/MYLANTA) 200-200-20 MG/5ML suspension 30 mL  30 mL Oral Q4H PRN Lamar Sprinkles, MD       estradiol (ESTRACE) tablet 0.5 mg  0.5 mg Oral Daily Lamar Sprinkles, MD   0.5 mg at 03/19/21 0836   lamoTRIgine (LAMICTAL) tablet 200 mg  200 mg Oral QHS Bobbitt, Shalon E, NP   200 mg at 03/18/21 2127   levothyroxine (SYNTHROID) tablet 137 mcg  137 mcg Oral Q0600 Bobbitt, Shalon E, NP   137 mcg at 03/19/21 3244   liothyronine (CYTOMEL) tablet 5 mcg  5 mcg Oral Daily Mason Jim, Amy E, MD   5 mcg at 03/19/21 0102   magnesium hydroxide (MILK OF MAGNESIA) suspension 30 mL  30 mL Oral Daily PRN Lamar Sprinkles, MD       multivitamin with minerals tablet 1 tablet  1 tablet Oral Daily Comer Locket, MD   1 tablet at 03/19/21 0836   PARoxetine (PAXIL) tablet 20 mg  20 mg Oral QHS Lamar Sprinkles, MD       progesterone (PROMETRIUM) capsule 200 mg  200 mg Oral QHS Bobbitt, Shalon E, NP   200 mg at 03/18/21 2126   QUEtiapine (SEROQUEL) tablet 600 mg  600 mg Oral QHS Lamar Sprinkles, MD       simvastatin (ZOCOR) tablet 20 mg  20 mg Oral Q2000 Bobbitt, Shalon E, NP   20 mg at 03/18/21 2127   thiamine tablet 100 mg  100 mg Oral Daily Mason Jim, Amy E, MD   100 mg at 03/19/21 7253   traZODone (DESYREL) tablet 100 mg  100 mg Oral QHS PRN Comer Locket, MD        Lab Results:  Results for orders placed or performed during the hospital encounter of 03/15/21 (from the past 48 hour(s))  Basic metabolic panel     Status: Abnormal   Collection Time: 03/18/21  6:22 AM  Result Value Ref Range   Sodium 145 135 - 145 mmol/L   Potassium 3.9 3.5 - 5.1 mmol/L   Chloride 108 98 - 111 mmol/L   CO2 32 22 - 32 mmol/L   Glucose, Bld 86 70 - 99 mg/dL  Comment: Glucose reference range applies only to samples taken after fasting for at least 8 hours.   BUN 17 6 - 20 mg/dL   Creatinine, Ser 9.37 (H) 0.44 - 1.00 mg/dL   Calcium 9.1 8.9 - 16.9 mg/dL   GFR, Estimated 52 (L) >60 mL/min    Comment: (NOTE) Calculated using the CKD-EPI Creatinine Equation (2021)    Anion gap 5 5 - 15    Comment: Performed at [facility], [address], [place], [place] [address]  T3, free     Status: Abnormal   Collection Time: 03/18/21  6:22 AM  Result Value Ref Range   T3, Free 1.8 (L) 2.0 - 4.4 pg/mL    Comment: (NOTE) Performed At: [facility] [address] [place], [place] [id number] Jolene Schimke MD [id number]   T4, free     Status: Abnormal   Collection Time: 03/18/21  6:22 AM  Result Value Ref Range   Free T4 0.45 (L) 0.61 - 1.12 ng/dL    Comment: (NOTE) Biotin ingestion may interfere with free T4 tests. If the results are inconsistent with the TSH level, previous test results, or the clinical presentation, then consider biotin interference. If needed, order repeat testing after stopping biotin. Performed at [facility], [address], [place], [place] [address]   Basic metabolic panel     Status: Abnormal   Collection Time: 03/19/21  6:10 AM  Result Value Ref Range   Sodium 144 135 - 145 mmol/L   Potassium 4.0 3.5 - 5.1 mmol/L   Chloride 103 98 - 111 mmol/L   CO2 32 22 - 32 mmol/L   Glucose, Bld 93 70 - 99 mg/dL    Comment: Glucose reference range applies only to samples taken after fasting for at  least 8 hours.   BUN 18 6 - 20 mg/dL   Creatinine, Ser 3.15 (H) 0.44 - 1.00 mg/dL   Calcium 9.3 8.9 - 40.0 mg/dL   GFR, Estimated 59 (L) >60 mL/min    Comment: (NOTE) Calculated using the CKD-EPI Creatinine Equation (2021)    Anion gap 9 5 - 15    Comment: Performed at [facility], [address] [place], [place] [address]    Blood Alcohol level:  Lab Results  Component Value Date   ETH 12 (H) 03/15/2021    Metabolic Disorder Labs: No results found for: HGBA1C, MPG No results found for: PROLACTIN Lab Results  Component Value Date   CHOL 230 (H) 03/16/2021   TRIG 90 03/16/2021   HDL 92 03/16/2021   CHOLHDL 2.5 03/16/2021   VLDL 18 03/16/2021   LDLCALC 120 (H) 03/16/2021    Physical Findings: AIMS: Facial and Oral Movements Muscles of Facial Expression: None, normal Lips and Perioral Area: None, normal Jaw: None, normal Tongue: None, normal,Extremity Movements Upper (arms, wrists, hands, fingers): None, normal Lower (legs, knees, ankles, toes): None, normal, Trunk Movements Neck, shoulders, hips: None, normal, Overall Severity Severity of abnormal movements (highest score from questions above): None, normal Incapacitation due to abnormal movements: None, normal Patient's awareness of abnormal movements (rate only patient's report): No Awareness, Dental Status Current problems with teeth and/or dentures?: No Does patient usually wear dentures?: No  CIWA:  CIWA-Ar Total: 1 COWS:     Musculoskeletal: Strength & Muscle Tone: within normal limits Gait & Station: normal Patient leans: N/A  Psychiatric Specialty Exam:  Presentation  General Appearance: Appropriate for Environment; Casual  Eye Contact:Good but does blink and  squint at times during exam   Speech:Clear and Coherent; Normal Rate (No longer verbose)  - no longer pressured   Speech Volume:Normal  Handedness: No data recorded  Mood and Affect  Mood:described as "I'm  good."  Affect: polite, less elevated   Thought Process  Thought Processes:Coherent; Linear   Descriptions of Associations:Intact  Orientation:Full (Time, Place and Person)  Thought Content: No longer endorses paranoia nor persecutory delusions toward her husband; denies AVH, ideas of reference, first rank symptoms and is not grossly responding to internal/external stimuli on exam  History of Schizophrenia/Schizoaffective disorder:No  Duration of Psychotic Symptoms:Less than six months  Hallucinations:Hallucinations: None  Ideas of Reference:Denied  Suicidal Thoughts:Suicidal Thoughts: No  Homicidal Thoughts:Homicidal Thoughts: No   Sensorium  Memory:Immediate Good; Recent Good; Remote Good  Judgment: Good  Insight: Good, improved   Executive Functions  Concentration:Improved  Attention Span:Good  Recall:Good  Fund of Knowledge:Good  Language:Good   Psychomotor Activity  Psychomotor Activity:Fidgety but no tremors noted   Assets  Assets:Communication Skills; Desire for Improvement; Resilience; Social Support   Sleep  Sleep: 4.75 hours  Physical Exam: Physical Exam Vitals and nursing note reviewed.  Constitutional:      General: She is not in acute distress.    Appearance: Normal appearance. She is normal weight. She is not ill-appearing or toxic-appearing.  HENT:     Head: Normocephalic and atraumatic.  Pulmonary:     Effort: Pulmonary effort is normal.  Musculoskeletal:        General: Normal range of motion.  Neurological:     General: No focal deficit present.     Mental Status: She is alert.   Review of Systems  Respiratory:  Negative for cough and shortness of breath.   Cardiovascular:  Negative for chest pain.  Gastrointestinal:  Negative for abdominal pain, constipation, diarrhea, nausea and vomiting.  Musculoskeletal:           Neurological:  Negative for weakness and headaches.  Psychiatric/Behavioral:  Negative for  depression, hallucinations and suicidal ideas. The patient is not nervous/anxious.   Blood pressure (!) 154/95, pulse 65, temperature 98.4 F (36.9 C), temperature source Oral, resp. rate 20, height 5\' 6"  (1.676 m), weight 88.5 kg, SpO2 100 %. Body mass index is 31.47 kg/m.   Treatment Plan Summary: Daily contact with patient to assess and evaluate symptoms and progress in treatment   Lindsay Coltrane is a 60 yr old female who presents with paranoia accusing her husband of poisoning her and recent evidence of manic behaviors. PPHx is significant for Bipolar Disorder.   She is no longer verbose, and her mania has improved over the course of her admission. Given continued mania and lack of trazodone use for sleep, we will again make adjustments to her Seroquel.  In order to prevent discontinuation serotonergic symptoms from Paxil, we will slow the taper off.  Pharmacy confirmed that patient has not been taking EstroGel since 2014 and has only been on the estradiol.  We will continue to monitor.   Bipolar I Disorder, current episode manic w/ psychotic features: -Increase Seroquel to 600 mg QHS -Decrease Paxil to 20 mg QHS- monitoring for discontinuation symptoms -Continue Lamictal 200 mg daily -Reduce Trazodone to 100mg  qhs PRN since she was so sedated yesterday and monitor for insomnia - EKG: QTC 401 - will recheck with dose titration on Seroquel with EKG tomorrow, 9/21  Restlessness, consistent with akathisia -Began Inderal 10 mg twice daily (r/b/se/a to medication reviewed and she consents to med  trial)    Elevated Creatinine, downtrending  1.07 this morning (down from 1.20 and 1.28) -Continue to encourage increased p.o. fluid intake   Hypothyroidism: Patient admitted to medication noncompliance -Continue Levothyroxine 137 mcg daily -Continue Cytomel 5 mcg daily -Free T4 mildly decreased at 0.45; T3 decreased at 1.8; TSH elevated at 46.845 -Advised to follow-up with  PCP   Dyslipidemia: -Continue Simvastatin 20 mg daily   Menopause -Continue Estrace 0.5 mg and progesterone 200 mg nightly   -Continue PRN's: Tylenol, Maalox, Atarax, Milk of Magnesia, Trazodone   Courtney Cosby,  03/19/2021, 7:22 PM

## 2021-03-19 NOTE — Progress Notes (Signed)
   03/19/21 0602  Vital Signs  Temp 97.9 F (36.6 C)  Temp Source Oral  Pulse Rate 62  Pulse Rate Source Monitor  BP 140/81  BP Location Right Arm  BP Method Automatic  Patient Position (if appropriate) Sitting  Oxygen Therapy  SpO2 99 %

## 2021-03-19 NOTE — Progress Notes (Signed)
D:  Genni was up and visible on the unit.  She attended evening wrap up group.  She denied any SI/HI or AVH.  No paranoid statements made this evening.  Speech was rapid but appropriate.  She discussed the increase in her medications and voiced to questions at this time.  She took her hs medications without difficulty and no prns required at this time.  She is currently resting with her eyes closed and appears to be asleep.   A:  1:1 with RN for support and encouragement.  Medications administered as ordered.  Q 15 minute checks maintained for safety.  Encouraged participation in group and unit activities.   R:  She remains safe on the unit.  We will continue to monitor the progress towards her goals.    03/19/21 2140  Psych Admission Type (Psych Patients Only)  Admission Status Voluntary  Psychosocial Assessment  Patient Complaints None  Eye Contact Fair  Facial Expression Animated  Affect Appropriate to circumstance  Speech Logical/coherent;Rapid  Interaction Assertive  Motor Activity Other (Comment) (unremarkable)  Appearance/Hygiene Unremarkable  Behavior Characteristics Cooperative;Appropriate to situation  Mood Pleasant  Thought Process  Coherency WDL  Content WDL  Delusions None reported or observed  Perception WDL  Hallucination None reported or observed  Judgment Impaired  Confusion None  Danger to Self  Current suicidal ideation? Denies  Danger to Others  Danger to Others None reported or observed

## 2021-03-19 NOTE — Progress Notes (Signed)
Mariell was up and visible on the unit.  She attended evening wrap up group.  She was pleasant and cooperative.  She interacted well with staff and peers.  She denied any pain or discomfort and appeared to be in no physical distress.  She denied SI/HI or AVH.  She denied any paranoia.  She reported struggling with being bored today. She denied any pain or discomfort and appeared to be in no physical distress.  No prn's required at this time.  She is currently resting with her eyes closed and appears to be asleep.  Q 15 minute checks maintained for safety.    03/18/21 2100  Psych Admission Type (Psych Patients Only)  Admission Status Voluntary  Psychosocial Assessment  Patient Complaints None  Eye Contact Fair  Facial Expression Animated  Affect Appropriate to circumstance  Speech Logical/coherent  Interaction Assertive  Motor Activity Other (Comment) (unremarkable)  Appearance/Hygiene Unremarkable  Behavior Characteristics Cooperative;Appropriate to situation  Mood Pleasant;Euthymic  Thought Process  Coherency WDL  Content WDL (pt adamantly denied)  Delusions None reported or observed  Perception WDL  Hallucination None reported or observed  Judgment Impaired  Confusion None  Danger to Self  Current suicidal ideation? Denies  Danger to Others  Danger to Others None reported or observed

## 2021-03-20 MED ORDER — MELATONIN 5 MG PO TABS
5.0000 mg | ORAL_TABLET | Freq: Every day | ORAL | Status: DC
  Administered 2021-03-20: 5 mg via ORAL
  Filled 2021-03-20 (×4): qty 1

## 2021-03-20 NOTE — Progress Notes (Signed)
Patient affect animated, pleasant and cooperative. Denies AVH, SI or HI.   Patient reports the following on Self Inventory Questionnaire:  Sleeping poor and did not request sleep medication, appetite good and energy level high.  Rates depression 3/10 and anxiety 2/10. Patient states their goal for today is, "Get rest, plenty of water, exercise and groups."  Emotional support and encouragement provided. Denies pain.    Patient compliant with all scheduled meds.  Observed appropriate interaction with peers and staff on unit.  Reviewed POC with patient. Questions answered and understanding verbalized. Ongoing Q15 minute safety check rounds per unit protocol.   03/20/21 0800  Psych Admission Type (Psych Patients Only)  Admission Status Voluntary  Psychosocial Assessment  Patient Complaints None  Eye Contact Fair  Facial Expression Animated  Affect Appropriate to circumstance  Speech Logical/coherent;Rapid  Interaction Assertive  Motor Activity Other (Comment) (WDL)  Appearance/Hygiene Unremarkable  Behavior Characteristics Cooperative;Appropriate to situation  Mood Pleasant  Thought Process  Coherency WDL  Content WDL  Delusions None reported or observed  Perception WDL  Hallucination None reported or observed  Judgment Impaired  Confusion None  Danger to Self  Current suicidal ideation? Denies  Danger to Others  Danger to Others None reported or observed

## 2021-03-20 NOTE — Group Note (Signed)
Recreation Therapy Group Note   Group Topic:Stress Management  Group Date: 03/20/2021 Start Time: 0930 End Time: 0945 Facilitators: Caroll Rancher, LRT/CTRS Location: [address] Dayroom  Goal Area(s) Addresses:  Patient will actively participate in stress management techniques presented during session.  Patient will successfully identify benefit of practicing stress management post d/c.   Group Description: Progressive Muscle Relaxation. LRT provided education, instruction and demonstration on practice of Progressive Muscle Relaxation. Patient was asked to participate in technique introduced during session. Patient was to tense each muscle then release the tension. Patient was asked to pay attention to their body and if they felt any discomfort, to skip those areas during the session.  LRT informed pts about resources to access pre-recorded scripts for PMR post d/c via Youtube and other apps or via internet with a smartphone, tablet, and/or computer.   Affect/Mood: N/A   Participation Level: Did not attend    Clinical Observations/Individualized Feedback: Pt did not attend group session.     Plan: Continue to engage patient in RT group sessions 2-3x/week.   Caroll Rancher, LRT/CTRS 03/20/2021 12:12 PM

## 2021-03-20 NOTE — Group Note (Signed)
LCSW Group Therapy Note  Group Date: 03/20/2021 Start Time: 1300 End Time: 1400   Type of Therapy and Topic:  Group Therapy: Positive Affirmations  Participation Level:  Active   Description of Group:   This group addressed positive affirmation towards self and others.  Patients went around the room and identified two positive things about themselves and two positive things about a peer in the room.  Patients reflected on how it felt to share something positive with others, to identify positive things about themselves, and to hear positive things from others/ Patients were encouraged to have a daily reflection of positive characteristics or circumstances.   Therapeutic Goals: Patients will verbalize two of their positive qualities Patients will demonstrate empathy for others by stating two positive qualities about a peer in the group Patients will verbalize their feelings when voicing positive self affirmations and when voicing positive affirmations of others Patients will discuss the potential positive impact on their wellness/recovery of focusing on positive traits of self and others.  Summary of Patient Progress:  Lilly actively engaged in the discussion and was able to identify strengths in people that inspired her . She was able to identify positive affirmations about herself including that she is a compassionate and caring person.  Patient demonstrated good insight into the subject matter, was respectful of peers, participated throughout the entire session.  Therapeutic Modalities:   Cognitive Behavioral Therapy Motivational Interviewing    Beatris Si, LCSW 03/20/2021  3:10 PM

## 2021-03-20 NOTE — [facility] Suicide Risk Assessment (Addendum)
[facility] INPATIENT:  Family/Significant Other Suicide Prevention Education  Suicide Prevention Education:  Contact Attempts:  [name] (Daughter)  [phone], (name of family member/significant other) has been identified by the patient as the family member/significant other with whom the patient will be residing, and identified as the person(s) who will aid the patient in the event of a mental health crisis.  With written consent from the patient, two attempts were made to provide suicide prevention education, prior to and/or following the patient's discharge.  We were unsuccessful in providing suicide prevention education.  A suicide education pamphlet was given to the patient to share with family/significant other.  Date and time of first attempt:03/20/21  /  11:20am  CSW was unable to leave a voicemail do to it not being set up.  Date and time of second attempt: 03/21/21   /   10:30am  CSW was unable to leave a voicemail do to it not being set up.  Lindsay [name] 03/20/2021, 11:21 AM

## 2021-03-20 NOTE — Progress Notes (Signed)
Did not attend group 

## 2021-03-20 NOTE — Progress Notes (Signed)
[facility] MD Progress Note  03/20/2021 5:43 PM Lindsay [name]  MRN:  [id number] Subjective:    Lindsay [name] is a 60 yr old female who presents with paranoia accusing her husband of poisoning her and evidence of acute mania. PPHx is significant for Bipolar Disorder.  Case was discussed in the multidisciplinary team. MAR was reviewed and patient was compliant with medications. Per nursing, yesterday the patient was not a behavioral problem, but during afternoon group sessions, she was fidgety. She did not require PRNs.  The patient reports that sleep was overall broken. Appetite has been average.  No paranoia today. She feels that she is "so ready" with regards to discussing discharge planning. She had a headache earlier but it is better with exercise. She is not having thoughts of harm to self or others, and no hallucinations.    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 Theressa Millard   Depression    OCD   Hypothyroidism     Past Surgical History:  Procedure Laterality Date   BLADDER SUSPENSION N/A 06/01/2015   Procedure: TRANSVAGINAL TAPE (TVT) PROCEDURE;  Surgeon: Osborn Coho, MD;  Location: [facility];  Service: Gynecology;  Laterality: N/A;   cesarean section times two      CYSTOSCOPY N/A 06/01/2015   Procedure: CYSTOSCOPY;  Surgeon: Osborn Coho, MD;  Location: [facility];  Service: Gynecology;  Laterality: N/A;   DIAGNOSTIC LAPAROSCOPY     DILATATION & CURRETTAGE/HYSTEROSCOPY WITH RESECTOCOPE N/A 06/01/2015   Procedure: DILATATION &  CURETTAGE/HYSTEROSCOPY WITH RESECTOCOPE;  Surgeon: Osborn Coho, MD;  Location: [facility];  Service: Gynecology;  Laterality: N/A;   DILATION AND CURETTAGE OF UTERUS     FRACTURE SURGERY     on right heel    LAPAROSCOPIC GASTRIC SLEEVE RESECTION     Family History: History reviewed. No pertinent family history. Family Psychiatric  History: Maternal Grandmother and Multiple family members on both sides - Bipolar Disorder  Social History:  Social History   Substance and Sexual Activity  Alcohol Use Yes   Alcohol/week: 1.0 standard drink   Types: 1 Glasses of wine per week   Comment: occ     Social History   Substance and Sexual Activity  Drug Use No    Social History   Socioeconomic History   Marital status: Married    Spouse name: Not on file   Number of children: Not on file   Years of education: Not on file   Highest education level: Not on file  Occupational History   Not on file  Tobacco Use   Smoking status: Never   Smokeless tobacco: Never  Substance and Sexual Activity   Alcohol use: Yes    Alcohol/week: 1.0 standard drink    Types: 1 Glasses of wine per week    Comment: occ   Drug use: No   Sexual activity: Not on file  Other Topics Concern   Not on file  Social History Narrative   Not on file   Social Determinants of Health   Financial Resource Strain: Not on  file  Food Insecurity: Not on file  Transportation Needs: Not on file  Physical Activity: Not on file  Stress: Not on file  Social Connections: Not on file    Sleep: Good  Appetite:  Good  Current Medications: Current Facility-Administered Medications  Medication Dose Route Frequency Provider Last Rate Last Admin   acetaminophen (TYLENOL) tablet 650 mg  650 mg Oral Q6H PRN Lamar Sprinkles, MD   650 mg at 03/20/21 1419   alum & mag hydroxide-simeth (MAALOX/MYLANTA) 200-200-20 MG/5ML suspension 30 mL  30 mL Oral Q4H PRN Lamar Sprinkles, MD       estradiol (ESTRACE) tablet 0.5 mg  0.5 mg  Oral Daily Lamar Sprinkles, MD   0.5 mg at 03/20/21 0827   lamoTRIgine (LAMICTAL) tablet 200 mg  200 mg Oral QHS Bobbitt, Shalon E, NP   200 mg at 03/19/21 2102   liothyronine (CYTOMEL) tablet 5 mcg  5 mcg Oral Daily Comer Locket, MD   5 mcg at 03/20/21 2841   magnesium hydroxide (MILK OF MAGNESIA) suspension 30 mL  30 mL Oral Daily PRN Lamar Sprinkles, MD       multivitamin with minerals tablet 1 tablet  1 tablet Oral Daily Comer Locket, MD   1 tablet at 03/20/21 3244   PARoxetine (PAXIL) tablet 20 mg  20 mg Oral QHS Lamar Sprinkles, MD   20 mg at 03/19/21 2102   progesterone (PROMETRIUM) capsule 200 mg  200 mg Oral QHS Bobbitt, Shalon E, NP   200 mg at 03/19/21 2102   propranolol (INDERAL) tablet 10 mg  10 mg Oral BID Lamar Sprinkles, MD   10 mg at 03/20/21 1648   QUEtiapine (SEROQUEL) tablet 600 mg  600 mg Oral QHS Lamar Sprinkles, MD   600 mg at 03/19/21 2102   thiamine tablet 100 mg  100 mg Oral Daily Comer Locket, MD   100 mg at 03/20/21 0827   traZODone (DESYREL) tablet 100 mg  100 mg Oral QHS PRN Comer Locket, MD        Lab Results:  Results for orders placed or performed during the hospital encounter of 03/15/21 (from the past 48 hour(s))  Basic metabolic panel     Status: Abnormal   Collection Time: 03/19/21  6:10 AM  Result Value Ref Range   Sodium 144 135 - 145 mmol/L   Potassium 4.0 3.5 - 5.1 mmol/L   Chloride 103 98 - 111 mmol/L   CO2 32 22 - 32 mmol/L   Glucose, Bld 93 70 - 99 mg/dL    Comment: Glucose reference range applies only to samples taken after fasting for at least 8 hours.   BUN 18 6 - 20 mg/dL   Creatinine, Ser 0.10 (H) 0.44 - 1.00 mg/dL   Calcium 9.3 8.9 - 27.2 mg/dL   GFR, Estimated 59 (L) >60 mL/min    Comment: (NOTE) Calculated using the CKD-EPI Creatinine Equation (2021)    Anion gap 9 5 - 15    Comment: Performed at [facility], [address], [place], [place] [address]  Hemoglobin A1c     Status: None   Collection  Time: 03/19/21  6:26 PM  Result Value Ref Range   Hgb A1c MFr Bld 5.6 4.8 - 5.6 %    Comment: (NOTE) Pre diabetes:          5.7%-6.4%  Diabetes:              >6.4%  Glycemic control for   <  7.0% adults with diabetes    Mean Plasma Glucose 114.02 mg/dL    Comment: Performed at [facility], [address], [place], [place] [address]    Blood Alcohol level:  Lab Results  Component Value Date   ETH 12 (H) 03/15/2021    Metabolic Disorder Labs: Lab Results  Component Value Date   HGBA1C 5.6 03/19/2021   MPG 114.02 03/19/2021   No results found for: PROLACTIN Lab Results  Component Value Date   CHOL 230 (H) 03/16/2021   TRIG 90 03/16/2021   HDL 92 03/16/2021   CHOLHDL 2.5 03/16/2021   VLDL 18 03/16/2021   LDLCALC 120 (H) 03/16/2021    Physical Findings: AIMS: Facial and Oral Movements Muscles of Facial Expression: None, normal Lips and Perioral Area: None, normal Jaw: None, normal Tongue: None, normal,Extremity Movements Upper (arms, wrists, hands, fingers): None, normal Lower (legs, knees, ankles, toes): None, normal, Trunk Movements Neck, shoulders, hips: None, normal, Overall Severity Severity of abnormal movements (highest score from questions above): None, normal Incapacitation due to abnormal movements: None, normal Patient's awareness of abnormal movements (rate only patient's report): No Awareness, Dental Status Current problems with teeth and/or dentures?: No Does patient usually wear dentures?: No  CIWA:  CIWA-Ar Total: 1 COWS:     Musculoskeletal: Strength & Muscle Tone: within normal limits Gait & Station: normal Patient leans: N/A  Psychiatric Specialty Exam:  Presentation  General Appearance: Appropriate for Environment; Casual  Eye Contact:Good but does blink and squint at times during exam   Speech:Clear and Coherent; Normal Rate (No longer verbose)  - no longer pressured   Speech Volume:Normal  Handedness: No data  recorded  Mood and Affect  Mood:described as "I'm good."  Affect: polite, less elevated   Thought Process  Thought Processes:Coherent; Linear   Descriptions of Associations:Intact  Orientation:Full (Time, Place and Person)  Thought Content: No longer endorses paranoia nor persecutory delusions toward her husband; denies AVH, ideas of reference, first rank symptoms and is not grossly responding to internal/external stimuli on exam  History of Schizophrenia/Schizoaffective disorder:No  Duration of Psychotic Symptoms:Less than six months  Hallucinations:Hallucinations: None  Ideas of Reference:Denied  Suicidal Thoughts:Suicidal Thoughts: No  Homicidal Thoughts:Homicidal Thoughts: No   Sensorium  Memory:Immediate Good; Recent Good; Remote Good  Judgment: Good  Insight: Good, improved   Executive Functions  Concentration:Improved  Attention Span:Good  Recall:Good  Fund of Knowledge:Good  Language:Good   Psychomotor Activity  Psychomotor Activity:Fidgety but no tremors noted   Assets  Assets:Communication Skills; Desire for Improvement; Resilience; Social Support   Sleep  Sleep: 4.75 hours  Physical Exam: Physical Exam Vitals and nursing note reviewed.  Constitutional:      General: She is not in acute distress.    Appearance: Normal appearance. She is normal weight. She is not ill-appearing or toxic-appearing.  HENT:     Head: Normocephalic and atraumatic.  Pulmonary:     Effort: Pulmonary effort is normal.  Musculoskeletal:        General: Normal range of motion.  Neurological:     General: No focal deficit present.     Mental Status: She is alert.   Review of Systems  Respiratory:  Negative for cough and shortness of breath.   Cardiovascular:  Negative for chest pain.  Gastrointestinal:  Negative for abdominal pain, constipation, diarrhea, nausea and vomiting.  Musculoskeletal:           Neurological:  Negative for weakness and  headaches.  Psychiatric/Behavioral:  Negative for  depression, hallucinations and suicidal ideas. The patient is not nervous/anxious.   Blood pressure 122/84, pulse 65, temperature 97.7 F (36.5 C), temperature source Oral, resp. rate 20, height 5\' 6"  (1.676 m), weight 88.5 kg, SpO2 99 %. Body mass index is 31.47 kg/m.   Treatment Plan Summary: Daily contact with patient to assess and evaluate symptoms and progress in treatment   Lindsay Krog is a 60 yr old female who presents with paranoia accusing her husband of poisoning her and recent evidence of manic behaviors. PPHx is significant for Bipolar Disorder.   She appears much improved overall. Sleep could be better.    Bipolar I Disorder, current episode manic w/ psychotic features: -Seroquel to 600 mg QHS -Paxil to 20 mg QHS- monitoring for discontinuation symptoms -Continue Lamictal 200 mg daily -Trazodone to 100mg  qhs PRN - not sure she is really getting much out of this, but her energy levels are appropriate  - EKG: QTC 401 - will recheck with dose titration on Seroquel with EKG 9/21  Restlessness, consistent with akathisia -Inderal 10 mg twice daily    Elevated Creatinine, downtrending  1.07  -Continue to encourage increased p.o. fluid intake, can follow up with primary care   Hypothyroidism: Patient admitted to medication noncompliance. Will follow up with primary care for repeat testing and medication management -Continue Levothyroxine 137 mcg daily -Continue Cytomel 5 mcg daily -Free T4 mildly decreased at 0.45; T3 decreased at 1.8; TSH elevated at 46.845    Dyslipidemia: -Continue Simvastatin 20 mg daily   Menopause -Continue Estrace 0.5 mg and progesterone 200 mg nightly   -Continue PRN's: Tylenol, Maalox, Atarax, Milk of Magnesia, Trazodone   MD 03/20/2021, 5:43 PM

## 2021-03-20 NOTE — Progress Notes (Signed)
[facility] MD Progress Note  03/20/2021 7:27 AM Lindsay [name]  MRN:  [id number] Subjective:    Lindsay [name] is a 60 yr old female who presents with paranoia accusing her husband of poisoning her and evidence of acute mania. PPHx is significant for Bipolar Disorder.  Case was discussed in the multidisciplinary team. MAR was reviewed and patient was compliant with medications. Per nursing, yesterday the patient was not a behavioral problem and did not require PRNs.  She reports that she is doing well today.  She reports that she did not sleep well last night, with no problem falling asleep, but with trouble staying asleep.  She reports that her appetite is good.  She reports no SI, HI, or AVH.  She reports no first rank symptoms, ideas of reference, or paranoia on the unit.    She says that overall, she feels much better, but she still endorses mild restlessness; although she acknowledges that this is improved with the Inderal.  She had no other concerns at present.   Principal Problem: Bipolar I disorder, current or most recent episode manic, with psychotic features (HCC) Diagnosis: Principal Problem:   Bipolar I disorder, current or most recent episode manic, with psychotic features (HCC) Active Problems:   Hypothyroidism, acquired  Total Time Spent in Direct Patient Care:  I personally spent 35 minutes on the unit in direct patient care. The direct patient care time included face-to-face time with the patient, reviewing the patient's chart, communicating with other professionals, and coordinating care. Greater than 50% of this time was spent in counseling or coordinating care with the patient regarding goals of hospitalization, psycho-education, and discharge planning needs.   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 Theressa Millard   Depression    OCD   Hypothyroidism     Past Surgical History:  Procedure Laterality Date   BLADDER SUSPENSION N/A 06/01/2015   Procedure: TRANSVAGINAL TAPE (TVT) PROCEDURE;  Surgeon: Osborn Coho, MD;  Location: [facility];  Service: Gynecology;  Laterality: N/A;   cesarean section times two      CYSTOSCOPY N/A 06/01/2015   Procedure: CYSTOSCOPY;  Surgeon: Osborn Coho, MD;  Location: [facility];  Service: Gynecology;  Laterality: N/A;   DIAGNOSTIC LAPAROSCOPY     DILATATION & CURRETTAGE/HYSTEROSCOPY WITH RESECTOCOPE N/A 06/01/2015   Procedure: DILATATION & CURETTAGE/HYSTEROSCOPY WITH RESECTOCOPE;  Surgeon: Osborn Coho, MD;  Location: [facility];  Service: Gynecology;  Laterality: N/A;   DILATION AND CURETTAGE OF UTERUS     FRACTURE SURGERY     on right heel    LAPAROSCOPIC GASTRIC SLEEVE RESECTION     Family History: History reviewed. No pertinent family history. Family Psychiatric  History: Maternal Grandmother and Multiple family members on both sides - Bipolar Disorder  Social History:  Social History   Substance and Sexual Activity  Alcohol Use Yes   Alcohol/week: 1.0 standard drink   Types: 1 Glasses of wine per week   Comment: occ     Social History   Substance and Sexual Activity  Drug Use No    Social History   Socioeconomic History   Marital status: Married    Spouse name: Not on file   Number of children: Not on file   Years of education: Not on file   Highest education level: Not  on file  Occupational History   Not on file  Tobacco Use   Smoking status: Never   Smokeless tobacco: Never  Substance and Sexual Activity   Alcohol use: Yes    Alcohol/week: 1.0 standard drink    Types: 1 Glasses of wine per week    Comment: occ   Drug use: No   Sexual activity: Not on file  Other Topics Concern   Not on file  Social History Narrative   Not on file   Social Determinants of Health   Financial Resource Strain: Not on file  Food Insecurity: Not on  file  Transportation Needs: Not on file  Physical Activity: Not on file  Stress: Not on file  Social Connections: Not on file    Sleep: Good  Appetite:  Good  Current Medications: Current Facility-Administered Medications  Medication Dose Route Frequency Provider Last Rate Last Admin   acetaminophen (TYLENOL) tablet 650 mg  650 mg Oral Q6H PRN Lamar Sprinkles, MD   650 mg at 03/17/21 0830   alum & mag hydroxide-simeth (MAALOX/MYLANTA) 200-200-20 MG/5ML suspension 30 mL  30 mL Oral Q4H PRN Lamar Sprinkles, MD       estradiol (ESTRACE) tablet 0.5 mg  0.5 mg Oral Daily Lamar Sprinkles, MD   0.5 mg at 03/19/21 0836   lamoTRIgine (LAMICTAL) tablet 200 mg  200 mg Oral QHS Bobbitt, Shalon E, NP   200 mg at 03/19/21 2102   liothyronine (CYTOMEL) tablet 5 mcg  5 mcg Oral Daily Comer Locket, MD   5 mcg at 03/19/21 1448   magnesium hydroxide (MILK OF MAGNESIA) suspension 30 mL  30 mL Oral Daily PRN Lamar Sprinkles, MD       multivitamin with minerals tablet 1 tablet  1 tablet Oral Daily Comer Locket, MD   1 tablet at 03/19/21 0836   PARoxetine (PAXIL) tablet 20 mg  20 mg Oral QHS Lamar Sprinkles, MD   20 mg at 03/19/21 2102   progesterone (PROMETRIUM) capsule 200 mg  200 mg Oral QHS Bobbitt, Shalon E, NP   200 mg at 03/19/21 2102   propranolol (INDERAL) tablet 10 mg  10 mg Oral BID Lamar Sprinkles, MD   10 mg at 03/19/21 2102   QUEtiapine (SEROQUEL) tablet 600 mg  600 mg Oral QHS Lamar Sprinkles, MD   600 mg at 03/19/21 2102   thiamine tablet 100 mg  100 mg Oral Daily Comer Locket, MD   100 mg at 03/19/21 1856   traZODone (DESYREL) tablet 100 mg  100 mg Oral QHS PRN Comer Locket, MD        Lab Results:  Results for orders placed or performed during the hospital encounter of 03/15/21 (from the past 48 hour(s))  Basic metabolic panel     Status: Abnormal   Collection Time: 03/19/21  6:10 AM  Result Value Ref Range   Sodium 144 135 - 145 mmol/L   Potassium 4.0 3.5 - 5.1 mmol/L    Chloride 103 98 - 111 mmol/L   CO2 32 22 - 32 mmol/L   Glucose, Bld 93 70 - 99 mg/dL    Comment: Glucose reference range applies only to samples taken after fasting for at least 8 hours.   BUN 18 6 - 20 mg/dL   Creatinine, Ser 3.14 (H) 0.44 - 1.00 mg/dL   Calcium 9.3 8.9 - 97.0 mg/dL   GFR, Estimated 59 (L) >60 mL/min    Comment: (NOTE) Calculated using the CKD-EPI Creatinine  Equation (2021)    Anion gap 9 5 - 15    Comment: Performed at [facility], [address] [place], [place] [address]  Hemoglobin A1c     Status: None   Collection Time: 03/19/21  6:26 PM  Result Value Ref Range   Hgb A1c MFr Bld 5.6 4.8 - 5.6 %    Comment: (NOTE) Pre diabetes:          5.7%-6.4%  Diabetes:              >6.4%  Glycemic control for   <7.0% adults with diabetes    Mean Plasma Glucose 114.02 mg/dL    Comment: Performed at [facility], [address], [place], [place] [address]    Blood Alcohol level:  Lab Results  Component Value Date   ETH 12 (H) 03/15/2021    Metabolic Disorder Labs: Lab Results  Component Value Date   HGBA1C 5.6 03/19/2021   MPG 114.02 03/19/2021   No results found for: PROLACTIN Lab Results  Component Value Date   CHOL 230 (H) 03/16/2021   TRIG 90 03/16/2021   HDL 92 03/16/2021   CHOLHDL 2.5 03/16/2021   VLDL 18 03/16/2021   LDLCALC 120 (H) 03/16/2021    Physical Findings: AIMS: Facial and Oral Movements Muscles of Facial Expression: None, normal Lips and Perioral Area: None, normal Jaw: None, normal Tongue: None, normal,Extremity Movements Upper (arms, wrists, hands, fingers): None, normal Lower (legs, knees, ankles, toes): None, normal, Trunk Movements Neck, shoulders, hips: None, normal, Overall Severity Severity of abnormal movements (highest score from questions above): None, normal Incapacitation due to abnormal movements: None, normal Patient's awareness of abnormal movements (rate only patient's report): No  Awareness, Dental Status Current problems with teeth and/or dentures?: No Does patient usually wear dentures?: No  CIWA:  CIWA-Ar Total: 1 COWS:     Musculoskeletal: Strength & Muscle Tone: within normal limits Gait & Station: normal Patient leans: N/A  Psychiatric Specialty Exam:  Presentation  General Appearance: Appropriate for Environment; Casual  Eye Contact:Good but does blink and squint at times during exam; this is decreased  Speech:Clear and Coherent; Normal Rate (No longer verbose)  - no longer pressured   Speech Volume:Normal  Handedness: No data recorded  Mood and Affect  Mood:described as "I'm good."  Affect: polite, less elevated   Thought Process  Thought Processes:Coherent; Linear   Descriptions of Associations:Intact  Orientation:Full (Time, Place and Person)  Thought Content: No longer endorses paranoia nor persecutory delusions toward her husband; denies AVH, ideas of reference, first rank symptoms and is not grossly responding to internal/external stimuli on exam  History of Schizophrenia/Schizoaffective disorder:No  Duration of Psychotic Symptoms:Less than six months  Hallucinations:Hallucinations: None  Ideas of Reference:Denied  Suicidal Thoughts:Suicidal Thoughts: No  Homicidal Thoughts:Homicidal Thoughts: No   Sensorium  Memory:Immediate Good; Recent Good; Remote Good  Judgment: Good  Insight: Good, improved   Executive Functions  Concentration:Improved  Attention Span:Good  Recall:Good  Fund of Knowledge:Good  Language:Good   Psychomotor Activity  Psychomotor Activity:Fidgety but no tremors noted   Assets  Assets:Communication Skills; Desire for Improvement; Resilience; Social Support   Sleep  Sleep: 4.75 hours  Physical Exam: Physical Exam Vitals and nursing note reviewed.  Constitutional:      General: She is not in acute distress.    Appearance: Normal appearance. She is normal weight. She is not  ill-appearing or toxic-appearing.  HENT:     Head: Normocephalic and atraumatic.  Pulmonary:  Effort: Pulmonary effort is normal.  Musculoskeletal:        General: Normal range of motion.  Neurological:     General: No focal deficit present.     Mental Status: She is alert.   Review of Systems  Respiratory:  Negative for cough and shortness of breath.   Cardiovascular:  Negative for chest pain.  Gastrointestinal:  Negative for abdominal pain, constipation, diarrhea, nausea and vomiting.  Musculoskeletal:           Neurological:  Negative for weakness and headaches.  Psychiatric/Behavioral:  Negative for depression, hallucinations and suicidal ideas. The patient is not nervous/anxious.   Blood pressure 103/68, pulse 65, temperature 98 F (36.7 C), temperature source Oral, resp. rate 20, height 5\' 6"  (1.676 m), weight 88.5 kg, SpO2 100 %. Body mass index is 31.47 kg/m.   Treatment Plan Summary: Daily contact with patient to assess and evaluate symptoms and progress in treatment   Lindsay [name] is a 60 yr old female who presents with paranoia accusing her husband of poisoning her and recent evidence of manic behaviors. PPHx is significant for Bipolar Disorder.   She is no longer verbose, and her mania has improved over the course of her admission. Given improvements in her mania and lack of trazodone use for sleep, we will not make adjustments to her Seroquel.  In order to prevent discontinuation serotonergic symptoms from Paxil, we will slow the taper off; no changes will be made today.  Pharmacy confirmed that patient has not been taking EstroGel since 2014 and has only been on the estradiol.  We will continue to monitor.   Bipolar I Disorder, current episode manic w/ psychotic features: -Continue Seroquel 600 mg QHS -Continue Paxil 20 mg QHS- monitoring for discontinuation symptoms -Continue Lamictal 200 mg daily -Reduce Trazodone to 100mg  qhs PRN since she was so sedated  yesterday and monitor for insomnia - EKG: QTC 401 - will recheck with dose titration on Seroquel with EKG not yet obtained; we will obtain tomorrow, 9/22  Restlessness, consistent with akathisia - Continue Inderal 10 mg twice daily   Elevated Creatinine, downtrending  1.07 this morning (down from 1.20 and 1.28) -Continue to encourage increased p.o. fluid intake.  Will repeat BMP 9/22.   Hypothyroidism: Patient admitted to medication noncompliance -Continue Levothyroxine 137 mcg daily -Continue Cytomel 5 mcg daily -Free T4 mildly decreased at 0.45; T3 decreased at 1.8; TSH elevated at 46.845 -Advised to follow-up with PCP   Dyslipidemia: -Continue Simvastatin 20 mg daily   Menopause -Continue Estrace 0.5 mg and progesterone 200 mg nightly   -Continue PRN's: Tylenol, Maalox, Atarax, Milk of Magnesia, Trazodone   10/22, MD PGY-1 03/20/2021, 7:27 AM

## 2021-03-20 NOTE — Progress Notes (Signed)
   03/20/21 2143  Psych Admission Type (Psych Patients Only)  Admission Status Voluntary  Psychosocial Assessment  Patient Complaints None  Eye Contact Fair  Facial Expression Animated  Affect Appropriate to circumstance  Speech Logical/coherent;Rapid  Interaction Assertive  Motor Activity Other (Comment) (WDL)  Appearance/Hygiene Unremarkable  Behavior Characteristics Cooperative;Appropriate to situation  Mood Pleasant  Thought Process  Coherency WDL  Content WDL  Delusions None reported or observed  Perception WDL  Hallucination None reported or observed  Judgment Impaired  Confusion None  Danger to Self  Current suicidal ideation? Denies  Danger to Others  Danger to Others None reported or observed

## 2021-03-20 NOTE — Progress Notes (Signed)
Pt EKG was completed 9/d20/22 at 1617.  The tracing was placed on the front of the chart.

## 2021-03-21 LAB — BASIC METABOLIC PANEL
Anion gap: 6 (ref 5–15)
BUN: 27 mg/dL — ABNORMAL HIGH (ref 6–20)
CO2: 32 mmol/L (ref 22–32)
Calcium: 9.7 mg/dL (ref 8.9–10.3)
Chloride: 105 mmol/L (ref 98–111)
Creatinine, Ser: 1.05 mg/dL — ABNORMAL HIGH (ref 0.44–1.00)
GFR, Estimated: >60 mL/min (ref >60)
Glucose, Bld: 92 mg/dL (ref 70–99)
Potassium: 3.9 mmol/L (ref 3.5–5.1)
Sodium: 143 mmol/L (ref 135–145)

## 2021-03-21 MED ORDER — QUETIAPINE FUMARATE 300 MG PO TABS
600.0000 mg | ORAL_TABLET | Freq: Every day | ORAL | Status: DC
  Filled 2021-03-21 (×2): qty 2

## 2021-03-21 MED ORDER — MELATONIN 5 MG PO TABS: 5.0000 mg | ORAL_TABLET | Freq: Every day | ORAL | 0 refills | Status: DC

## 2021-03-21 MED ORDER — LAMOTRIGINE 200 MG PO TABS
200.0000 mg | ORAL_TABLET | Freq: Every day | ORAL | Status: DC
  Filled 2021-03-21 (×2): qty 1

## 2021-03-21 MED ORDER — QUETIAPINE FUMARATE 300 MG PO TABS: 600.0000 mg | ORAL_TABLET | Freq: Every day | ORAL | 0 refills | Status: DC

## 2021-03-21 MED ORDER — PAROXETINE HCL 20 MG PO TABS: 20.0000 mg | ORAL_TABLET | Freq: Every day | ORAL | 0 refills | Status: DC

## 2021-03-21 MED ORDER — PAROXETINE HCL 20 MG PO TABS
20.0000 mg | ORAL_TABLET | Freq: Every day | ORAL | Status: DC
  Filled 2021-03-21 (×2): qty 1

## 2021-03-21 MED ORDER — TRAZODONE HCL 100 MG PO TABS: 100.0000 mg | ORAL_TABLET | Freq: Every evening | ORAL | 0 refills | Status: DC | PRN

## 2021-03-21 MED ORDER — LEVOTHYROXINE SODIUM 137 MCG PO TABS
137.0000 ug | ORAL_TABLET | Freq: Every day | ORAL | Status: DC
  Filled 2021-03-21 (×3): qty 1

## 2021-03-21 NOTE — Progress Notes (Signed)
   03/21/21 0802  Vital Signs  Pulse Rate 70  BP (!) 107/59  BP Method Automatic   D: Patient denies SI/HI/AVH. Patient denies both  anxiety and depression. Pt. Out in open areas and was social with peers and staff. Pt.'s BP was running low and pt reported feeling a "just a little dizzy" fluids were given and Inderal held. A:  Patient took scheduled medicine except Inderal d/t low bp and Estrace (med not available). Blood Pressure will be rechecked. Support and encouragement provided Routine safety checks conducted every 15 minutes. Patient  Informed to notify staff with any concerns.   R:  Safety maintained.

## 2021-03-21 NOTE — Progress Notes (Signed)
  [facility] Adult Case Management Discharge Plan :  Will you be returning to the same living situation after discharge:  Yes,  personal home At discharge, do you have transportation home?: Yes,  husband Do you have the ability to pay for your medications: Yes,  [employer]  Release of information consent forms completed and in the chart;  Patient's signature needed at discharge.  Patient to Follow up at:  Follow-up Information     [facility] Follow up on 04/02/2021.   Specialty: Behavioral Health Why: You have an appointment for medication management services on 04/02/21 at 2:00 pm.  This appointment will be held in person. Contact information: [address] [place] [address] [phone]         [facility]. Go on 03/29/2021.   Specialty: Behavioral Health Why: You have an appointment on 03/29/21 at 3:00 pm with Hal Neer for therapy services. This appointment will be held in person. Contact information: [address] [place] [address] [phone]                 Next level of care provider has access to [facility] Link:no  Safety Planning and Suicide Prevention discussed: Yes,  w/ pt     Has patient been referred to the Quitline?: N/A patient is not a smoker  Patient has been referred for addiction treatment: N/A  Felizardo Hoffmann, Theresia Majors 03/21/2021, 10:34 AM

## 2021-03-21 NOTE — Final Progress Note (Signed)
Discharge Note:  Patient denies SI/HI AVH at this time. Discharge instructions, AVS, prescriptions and transition record gone over with patient. Patient agrees to comply with medication management, follow-up visit, and outpatient therapy. Patient belongings returned to patient. Patient questions and concerns addressed and answered.  Patient ambulatory off unit.  Patient discharged to home with friend.   

## 2021-03-21 NOTE — BHH Suicide Risk Assessment (Signed)
[facility] Discharge Suicide Risk Assessment   Principal Problem: Bipolar I disorder, current or most recent episode manic, with psychotic features [facility]) Discharge Diagnoses: Principal Problem:   Bipolar I disorder, current or most recent episode manic, with psychotic features (HCC) Active Problems:   Hypothyroidism, acquired   Total Time spent with patient: 30 minutes  Musculoskeletal: Strength & Muscle Tone: within normal limits Gait & Station: normal Patient leans: N/A  Psychiatric Specialty Exam  Presentation  General Appearance: Appropriate for Environment; Casual  Eye Contact:Good  Speech:Clear and Coherent; Normal Rate (No longer verbose)  Speech Volume:Normal  Handedness: No data recorded  Mood and Affect  Mood:Euthymic (I'm good)  Duration of Depression Symptoms: Greater than two weeks  Affect:Congruent; Appropriate   Thought Process  Thought Processes:Coherent; Linear  Descriptions of Associations:Intact  Orientation:Full (Time, Place and Person)  Thought Content:Logical (Denies paranoid ideation)  History of Schizophrenia/Schizoaffective disorder:No  Duration of Psychotic Symptoms:Less than six months  Hallucinations:No data recorded Ideas of Reference:None  Suicidal Thoughts:No data recorded Homicidal Thoughts:No data recorded  Sensorium  Memory:Immediate Good; Recent Good; Remote Good  Judgment:Good  Insight:Good   Executive Functions  Concentration:Good  Attention Span:Good  Recall:Good  Fund of Knowledge:Good  Language:Good   Psychomotor Activity  Psychomotor Activity: No data recorded  Assets  Assets:Communication Skills; Desire for Improvement; Resilience; Social Support   Sleep  Sleep: No data recorded  Physical Exam: Physical Exam Vitals and nursing note reviewed.  Constitutional:      Appearance: Normal appearance.  HENT:     Head: Normocephalic and atraumatic.     Nose: Nose normal.  Eyes:     Extraocular  Movements: Extraocular movements intact.  Cardiovascular:     Rate and Rhythm: Normal rate.  Pulmonary:     Effort: Pulmonary effort is normal.  Musculoskeletal:     Cervical back: Normal range of motion.  Neurological:     General: No focal deficit present.     Mental Status: She is alert and oriented to person, place, and time.  Psychiatric:        Attention and Perception: Attention and perception normal.        Mood and Affect: Mood and affect normal.        Speech: Speech normal.        Behavior: Behavior normal. Behavior is cooperative.        Thought Content: Thought content normal.        Cognition and Memory: Cognition normal.        Judgment: Judgment normal.   ROS Blood pressure 117/78, pulse (!) 56, temperature 98.2 F (36.8 C), temperature source Oral, resp. rate 16, height 5\' 6"  (1.676 m), weight 88.5 kg, SpO2 99 %. Body mass index is 31.47 kg/m.  Mental Status Per Nursing Assessment::   On Admission:  NA  Demographic Factors:  Caucasian  Loss Factors: Loss of significant relationship and Financial problems/change in socioeconomic status  Historical Factors: Impulsivity  Risk Reduction Factors:   Positive social support and Positive coping skills or problem solving skills  Continued Clinical Symptoms:  Bipolar Disorder:   Mixed State Previous Psychiatric Diagnoses and Treatments  Cognitive Features That Contribute To Risk:  None    Suicide Risk:  Minimal: No identifiable suicidal ideation.  Patients presenting with no risk factors but with morbid ruminations; may be classified as minimal risk based on the severity of the depressive symptoms   Follow-up Information     [facility] Follow up on 04/02/2021.  Specialty: Behavioral Health Why: You have an appointment for medication management services on 04/02/21 at 2:00 pm.  This appointment will be held in person. Contact information: [address] [place]  [address] [phone]         [facility]. Go on 03/29/2021.   Specialty: Behavioral Health Why: You have an appointment on 03/29/21 at 3:00 pm with Hal Neer for therapy services. This appointment will be held in person. Contact information: [address] [place] [address] [phone]                 Plan Of Care/Follow-up recommendations:  Activity:  ad lib Diet:  regular  Roselle Locus, MD 03/21/2021, 11:36 AM

## 2021-03-21 NOTE — Discharge Summary (Addendum)
Physician Discharge Summary Note  Patient:  Lindsay [name] is an 60 y.o., female MRN:  [id number] DOB:  1961/02/10 Patient phone:  [phone] (home)  Patient address:   [address] [place] [address],  Total Time spent with patient: I personally spent 35 minutes on the unit in direct patient care. The direct patient care time included face-to-face time with the patient, reviewing the patient's chart, communicating with other professionals, and coordinating care. Greater than 50% of this time was spent in counseling or coordinating care with the patient regarding goals of hospitalization, psycho-education, and discharge planning needs.   Date of Admission:  03/15/2021 Date of Discharge: 03/21/2021  Reason for Admission:  Lindsay [name] is a 60 year old female with a psychiatric history of bipolar 1 disorder who presents from the [facility] after accusing her husband of poisoning her.  On assessment today, Lindsay [name] reports that for the past 3 months, she has had symptoms including blurred vision, nausea, rapid breathing, confusion, decreased body temperature, cold sweats, and congestion; she has the symptoms whenever she gets into her car (2012 [employer]) and sometimes while at home.  She says that at the end of her water dispenser, the water had a metallic taste to it.  She also reports that over this time period, food began to lose its taste to her.  Thus, she is led to believe that her husband is poisoning her water and/or her food.  She does acknowledge that she has no proof and that she is unsure, but she feels strongly that this is the case.  She is currently going through a divorce from her husband of 108 years, who she describes as "a narcissist."  According to Lindsay [name], her husband feigns care about her symptoms, appearing to her that he knows exactly what is going on.  Lindsay [name] says that this divorce will be very difficult for her because she "really loves him, and we have a codependent relationship."   Additionally, she endorses that over the past 8 weeks, she has been hypersomniac, anhedonic, and has felt down, with decreased motivation, energy, and appetite, as well as psychomotor retardation.  It is unclear in her timeline when she began to vocalize her paranoid thoughts.  Also during this time, her husband has discontinued her access to credit cards and their joint checking account.  Instead, he gave her cash; she used up this cash supply pretty quickly eating out and purchasing items for her sister with "special needs."  Lindsay [name] says that she had been stable on her medications (Seroquel 450 mg, Lamictal 200 mg, Paxil 45 mg, and trazodone 200 mg), and she has regular follow-up with [facility] for medication management and [facility] for therapy-every 2 weeks.  She also has support from her [facility] meetings.  She has been on her current dosages of medication for approximately 8 months to a year.     She denies SI/HI/AVH, and only voices paranoia in relation to the delusion of her husband poisoning her.  She denies current chest pain, changes to her vision, dyspnea, diaphoresis, and urinary issues.  She only endorses constipation, which is chronic.  Throughout the interview, Lindsay [name] is euphoric, verbose with pressured speech, and tangential.  She does not respond to internal stimuli.  Labs: CMP: Cr 1.28 (baseline 0.93) CBC: unremarkable EtOH: 12 UDS: pos Amphetamines (prescribed Adderall) TSH: 46.845 (2.11 on 01/15/21) Lipids:  Tchol 230, LDL 120  Principal Problem: Bipolar I disorder, current or most recent episode manic, with psychotic features [facility]) Discharge  Diagnoses: Principal Problem:   Bipolar I disorder, current or most recent episode manic, with psychotic features (HCC) Active Problems:   Hypothyroidism, acquired   Past Psychiatric History: Bipolar I disorder- dx 2010; No prior inpatient admissions. Follows outpatient at [facility] for medication  management and with therapy at [facility] every two weeks.   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 Theressa Millard   Depression    OCD   Hypothyroidism     Past Surgical History:  Procedure Laterality Date   BLADDER SUSPENSION N/A 06/01/2015   Procedure: TRANSVAGINAL TAPE (TVT) PROCEDURE;  Surgeon: Osborn Coho, MD;  Location: [facility];  Service: Gynecology;  Laterality: N/A;   cesarean section times two      CYSTOSCOPY N/A 06/01/2015   Procedure: CYSTOSCOPY;  Surgeon: Osborn Coho, MD;  Location: [facility];  Service: Gynecology;  Laterality: N/A;   DIAGNOSTIC LAPAROSCOPY     DILATATION & CURRETTAGE/HYSTEROSCOPY WITH RESECTOCOPE N/A 06/01/2015   Procedure: DILATATION & CURETTAGE/HYSTEROSCOPY WITH RESECTOCOPE;  Surgeon: Osborn Coho, MD;  Location: [facility];  Service: Gynecology;  Laterality: N/A;   DILATION AND CURETTAGE OF UTERUS     FRACTURE SURGERY     on right heel    LAPAROSCOPIC GASTRIC SLEEVE RESECTION     Family History: History reviewed. No pertinent family history. Family Psychiatric  History: Bipolar disorder- Maternal GMA, other family members on both sides of the family Substance use disorder, unspecified psychosis not substance-induced: Daughter Dad: Unspecified mental illness Maternal Gpa- Alcohol use disorder  Social History:  Currently going through a divorce from husband of almost 43 years No personal form of income; relies on husband Social History   Substance and Sexual Activity  Alcohol Use Yes   Alcohol/week: 1.0 standard drink   Types: 1 Glasses of wine per week   Comment: occ     Social History   Substance and Sexual Activity  Drug Use No    Social History   Socioeconomic History   Marital status: Married    Spouse name: Not on file   Number of children: Not on file   Years of education: Not on file   Highest education  level: Not on file  Occupational History   Not on file  Tobacco Use   Smoking status: Never   Smokeless tobacco: Never  Substance and Sexual Activity   Alcohol use: Yes    Alcohol/week: 1.0 standard drink    Types: 1 Glasses of wine per week    Comment: occ   Drug use: No   Sexual activity: Not on file  Other Topics Concern   Not on file  Social History Narrative   Not on file   Social Determinants of Health   Financial Resource Strain: Not on file  Food Insecurity: Not on file  Transportation Needs: Not on file  Physical Activity: Not on file  Stress: Not on file  Social Connections: Not on file    Hospital Course:  After the above admission evaluation, Lindsay [name]'s presenting symptoms were noted. She was recommended for mood stabilization treatments. The medication regimen targeting those presenting symptoms were discussed with her & her home medications were adjusted with her consent. Her home Seroquel 450 mg was titrated up to 600 mg during this admission; home Paxil 45 mg down to 20 mg; her home Trazodone 200 mg qhs  PRN and Lamictal 200 mg were continued as prescribed. Her UDS on arrival to the ED was positive for amphetamines (Adderall is a home medication), BAL 12 however, she did not develop any alcohol withdrawal symptoms & did not receive alcohol detoxification treatments. She was however medicated, stabilized & discharged on the medications as listed on her discharge medication lists below. Besides the mood stabilization treatments, Lindsay [name] was also enrolled & participated in the group counseling sessions being offered & held on this unit. She learned coping skills. She presented with the pre-existing medical issues of hypothyroidism, menopause, and dyslipidemia that required treatment with her home Levothyroxine 137 mcg, Cytomel 5 mcg, Estrace 0.5 mg, Progesterone 200 mg, and Simvastatin 20 mg. She and nursing staff did report the adverse effect of restlessness/ fidgety movements  that were consistent with akathisia. She was initiated on Inderal 10 mg BID, which resolved her symptoms. Her blood pressures were lower after starting the Inderal, so it was discontinued, not prescribed upon discharge.   During the course of Her hospitalization, the 15-minute checks were adequate to ensure patient's safety. Fate did not display any dangerous, violent or suicidal behavior on the unit.  She was initially intrusive, but once her mania improved, she interacted with patients & staff appropriately, participated appropriately in the group sessions/therapies. Her medications were addressed & adjusted to meet her needs. She was recommended for outpatient follow-up care & medication management upon discharge to assure continuity of care & mood stability.  At the time of discharge patient is not reporting any acute suicidal/homicidal ideations. She feels more confident about her self-care & in managing her mental health. She currently denies any new issues or concerns. Education and supportive counseling provided throughout her hospital stay & upon discharge.   Today upon her discharge evaluation with the attending psychiatrist, Lindsay [name] shares she is doing well. She denies any other specific concerns. She is sleeping relatively well. Her appetite is good. She denies other physical complaints. She denies AH/VH, delusional thoughts or paranoia. She does not appear to be responding to any internal stimuli. She feels that her medications have been helpful & is in agreement to continue her current treatment regimen as recommended. She was able to engage in safety planning including plan to return to [facility] or contact emergency services if she feels unable to maintain her own safety or the safety of others. Pt had no further questions, comments, or concerns. She left [facility] with all personal belongings in no apparent distress. Transportation per [employer].    Physical Findings: AIMS: Facial and Oral  Movements Muscles of Facial Expression: None, normal Lips and Perioral Area: None, normal Jaw: None, normal Tongue: None, normal,Extremity Movements Upper (arms, wrists, hands, fingers): None, normal Lower (legs, knees, ankles, toes): None, normal, Trunk Movements Neck, shoulders, hips: None, normal, Overall Severity Severity of abnormal movements (highest score from questions above): None, normal Incapacitation due to abnormal movements: None, normal Patient's awareness of abnormal movements (rate only patient's report): No Awareness, Dental Status Current problems with teeth and/or dentures?: No Does patient usually wear dentures?: No  CIWA:  CIWA-Ar Total: 1   Musculoskeletal: Strength & Muscle Tone: within normal limits Gait & Station: normal, steady Patient leans: N/A   Psychiatric Specialty Exam:  Presentation  General Appearance: Appropriate for Environment; Casual  Eye Contact:Good  Speech:Clear and Coherent; Normal Rate  Speech Volume:Normal  Handedness: No data recorded  Mood and Affect  Mood:Euthymic (I'm feeling good, had a better day)  Affect:Appropriate; Congruent  Thought Process  Thought Processes:Coherent; Linear  Descriptions of Associations:Intact  Orientation:Full (Time, Place and Person)  Thought Content:Logical (Denies paranoid ideation)  History of Schizophrenia/Schizoaffective disorder:No  Duration of Psychotic Symptoms:Less than six months  Hallucinations:Hallucinations: None  Ideas of Reference:None  Suicidal Thoughts:Suicidal Thoughts: No  Homicidal Thoughts:Homicidal Thoughts: No   Sensorium  Memory:Immediate Good; Recent Good; Remote Good  Judgment:Good  Insight:Good   Executive Functions  Concentration:Good  Attention Span:Good  Recall:Good  Fund of Knowledge:Good  Language:Good   Psychomotor Activity  Psychomotor Activity:Psychomotor Activity: Normal (No longer restless)   Assets   Assets:Communication Skills; Desire for Improvement; Housing; Resilience; Social Support   Sleep  Sleep:Sleep: Poor (Patient reported difficulty staying asleep) Number of Hours of Sleep: 5.75    Physical Exam: Physical Exam Vitals and nursing note reviewed.  Constitutional:      General: She is not in acute distress.    Appearance: Normal appearance.  HENT:     Head: Normocephalic and atraumatic.  Eyes:     Comments: Less blinking  Pulmonary:     Effort: Pulmonary effort is normal.  Musculoskeletal:        General: Normal range of motion.  Skin:    General: Skin is warm and dry.  Neurological:     General: No focal deficit present.     Mental Status: She is alert and oriented to person, place, and time.     Motor: No weakness.     Gait: Gait normal.   Review of Systems  Constitutional:  Negative for malaise/fatigue.  Eyes:        Less blinking that previous days  Respiratory:  Negative for shortness of breath.   Cardiovascular:  Negative for chest pain.  Gastrointestinal:  Negative for abdominal pain, constipation, diarrhea, nausea and vomiting.  Genitourinary: Negative.   Neurological:  Negative for tremors and headaches.  Blood pressure 117/78, pulse (!) 56, temperature 98.2 F (36.8 C), temperature source Oral, resp. rate 16, height 5\' 6"  (1.676 m), weight 88.5 kg, SpO2 99 %. Body mass index is 31.47 kg/m.   Social History   Tobacco Use  Smoking Status Never  Smokeless Tobacco Never   Tobacco Cessation:  N/A, patient does not currently use tobacco products   Blood Alcohol level:  Lab Results  Component Value Date   ETH 12 (H) 03/15/2021    Metabolic Disorder Labs:  Lab Results  Component Value Date   HGBA1C 5.6 03/19/2021   MPG 114.02 03/19/2021   No results found for: PROLACTIN Lab Results  Component Value Date   CHOL 230 (H) 03/16/2021   TRIG 90 03/16/2021   HDL 92 03/16/2021   CHOLHDL 2.5 03/16/2021   VLDL 18 03/16/2021   LDLCALC 120  (H) 03/16/2021    See Psychiatric Specialty Exam and Suicide Risk Assessment completed by Attending Physician prior to discharge.  Discharge destination:  Home  Is patient on multiple antipsychotic therapies at discharge:  No   Has Patient had three or more failed trials of antipsychotic monotherapy by history:  No  Recommended Plan for Multiple Antipsychotic Therapies: NA   Allergies as of 03/21/2021   No Known Allergies      Medication List     TAKE these medications      Indication  amphetamine-dextroamphetamine 15 MG tablet Commonly known as: ADDERALL Take 1 tablet by mouth 2 (two) times daily. Start taking on: March 26, 2021 What changed: Another medication with the same name was removed. Continue taking this medication, and follow  the directions you see here.  Indication: Recurring Sleep Episodes During the Day   amphetamine-dextroamphetamine 15 MG tablet Commonly known as: Adderall Take 1 tablet by mouth 2 (two) times daily with breakfast and lunch. Start taking on: April 24, 2021 What changed: Another medication with the same name was removed. Continue taking this medication, and follow the directions you see here.  Indication: Recurring Sleep Episodes During the Day   amphetamine-dextroamphetamine 15 MG tablet Commonly known as: ADDERALL Take 1 tablet by mouth 2 (two) times daily. Start taking on: May 24, 2021 What changed: Another medication with the same name was removed. Continue taking this medication, and follow the directions you see here.  Indication: Recurring Sleep Episodes During the Day   Cholecalciferol 125 MCG (5000 UT) capsule Take 5,000 Units by mouth daily.  Indication: Vitamin D Deficiency   estradiol 0.5 MG tablet Commonly known as: ESTRACE Take 0.5 tablets by mouth daily. What changed: Another medication with the same name was removed. Continue taking this medication, and follow the directions you see here.  Indication:  Deficiency of the Hormone Estrogen   lamoTRIgine 200 MG tablet Commonly known as: LAMICTAL TAKE 1 TABLET(200 MG) BY MOUTH DAILY  Indication: Manic-Depression   levothyroxine 137 MCG tablet Commonly known as: SYNTHROID Take 137 mcg by mouth daily before breakfast.  Indication: Underactive Thyroid   liothyronine 5 MCG tablet Commonly known as: CYTOMEL  Indication: Underactive Thyroid   melatonin 5 MG Tabs Take 1 tablet (5 mg total) by mouth at bedtime.  Indication: Trouble Sleeping   PARoxetine 20 MG tablet Commonly known as: PAXIL Take 1 tablet (20 mg total) by mouth at bedtime. What changed:  medication strength how much to take how to take this when to take this additional instructions  Indication: Obsessive Compulsive Disorder   progesterone 200 MG capsule Commonly known as: PROMETRIUM Take 200 mg by mouth daily.  Indication: Overgrowth of the Uterine Lining   QUEtiapine 300 MG tablet Commonly known as: SEROQUEL Take 2 tablets (600 mg total) by mouth at bedtime. What changed:  how much to take how to take this when to take this additional instructions  Indication: Manic Phase of Manic-Depression   simvastatin 20 MG tablet Commonly known as: ZOCOR Take 20 mg by mouth at bedtime.  Indication: High Amount of Fats in the Blood   traZODone 100 MG tablet Commonly known as: DESYREL Take 1 tablet (100 mg total) by mouth at bedtime as needed for sleep. What changed: how much to take  Indication: Trouble Sleeping   vitamin B-12 1000 MCG tablet Commonly known as: CYANOCOBALAMIN Take 1,000 mcg by mouth daily.  Indication: Inadequate Vitamin B12        Follow-up Information     [facility] Follow up on 04/02/2021.   Specialty: Behavioral Health Why: You have an appointment for medication management services on 04/02/21 at 2:00 pm.  This appointment will be held in person. Contact information: [address] [place]  [address] [phone]         [facility]. Go on 03/29/2021.   Specialty: Behavioral Health Why: You have an appointment on 03/29/21 at 3:00 pm with Hal Neer for therapy services. This appointment will be held in person. Contact information: [address] [place] [address] [phone]                 Follow-up recommendations:  Activity:  Normal, as tolerated Diet:  Regular  Comments:  Prescriptions were given at discharge.  Patient is agreeable with the discharge plan.  She was given an opportunity to ask questions.  She appears to feel comfortable with discharge and denies any current suicidal or homicidal thoughts.    Patient is instructed prior to discharge to: Take all medications as prescribed by his mental healthcare provider. Report any adverse effects and/or reactions from the medicines to his outpatient provider promptly. Patient has been instructed & cautioned: To not engage in alcohol and or illegal drug use while on prescription medicines.  In the event of worsening symptoms, patient is instructed to call the crisis hotline at 988, 911 and or go to the nearest ED for appropriate evaluation and treatment of symptoms. To follow-up with his primary care provider for your other medical issues, concerns and or health care needs.   Signed: Lamar Sprinkles, MD 03/21/2021, 3:52 PM

## 2021-03-21 NOTE — Progress Notes (Signed)
Adult Psychoeducational Group Note  Date:  03/21/2021 Time:  9:00 AM  Group Topic/Focus:  Goals Group:   The focus of this group is to help patients establish daily goals to achieve during treatment and discuss how the patient can incorporate goal setting into their daily lives to aide in recovery.  Participation Level:  Active  Participation Quality:  Appropriate  Affect:  Appropriate  Cognitive:  Appropriate  Insight: Appropriate  Engagement in Group:  Engaged  Modes of Intervention:  Discussion  Additional Comments:  The patient expressed that her goal is to have a great day .  Octavio Manns 03/21/2021, 9:00 AM

## 2021-03-21 NOTE — Progress Notes (Signed)
Patient rated her day as an 8 out of a possible 10. She states that today was one of her best days thus far. She was pleased with the fact that she was able to exercise outdoors with her peers. Her goal for tomorrow is to get discharged.

## 2021-03-25 ENCOUNTER — Other Ambulatory Visit: Payer: Self-pay

## 2021-03-25 ENCOUNTER — Encounter (HOSPITAL_COMMUNITY): Payer: Self-pay | Admitting: Emergency Medicine

## 2021-03-25 ENCOUNTER — Emergency Department (HOSPITAL_COMMUNITY)
Admission: EM | Admit: 2021-03-25 | Discharge: 2021-03-27 | Disposition: A | Discharge status: Home / Self Care | Payer: BC Managed Care – PPO | Attending: Emergency Medicine | Admitting: Emergency Medicine

## 2021-03-25 DIAGNOSIS — Z79899 Other long term (current) drug therapy: Secondary | ICD-10-CM | POA: Insufficient documentation

## 2021-03-25 DIAGNOSIS — F309 Manic episode, unspecified: Secondary | ICD-10-CM | POA: Insufficient documentation

## 2021-03-25 DIAGNOSIS — Z20822 Contact with and (suspected) exposure to covid-19: Secondary | ICD-10-CM | POA: Insufficient documentation

## 2021-03-25 DIAGNOSIS — E039 Hypothyroidism, unspecified: Secondary | ICD-10-CM | POA: Insufficient documentation

## 2021-03-25 DIAGNOSIS — F22 Delusional disorders: Secondary | ICD-10-CM | POA: Diagnosis not present

## 2021-03-25 DIAGNOSIS — R4182 Altered mental status, unspecified: Secondary | ICD-10-CM | POA: Diagnosis not present

## 2021-03-25 DIAGNOSIS — R259 Unspecified abnormal involuntary movements: Secondary | ICD-10-CM | POA: Diagnosis not present

## 2021-03-25 DIAGNOSIS — F600 Paranoid personality disorder: Secondary | ICD-10-CM | POA: Insufficient documentation

## 2021-03-25 DIAGNOSIS — Z046 Encounter for general psychiatric examination, requested by authority: Secondary | ICD-10-CM | POA: Insufficient documentation

## 2021-03-25 LAB — CBC WITH DIFFERENTIAL/PLATELET
Abs Immature Granulocytes: 0.02 10*3/uL (ref 0.00–0.07)
Basophils Absolute: 0.1 10*3/uL (ref 0.0–0.1)
Basophils Relative: 1 %
Eosinophils Absolute: 0 10*3/uL (ref 0.0–0.5)
Eosinophils Relative: 0 %
HCT: 37.1 % (ref 36.0–46.0)
Hemoglobin: 12.3 g/dL (ref 12.0–15.0)
Immature Granulocytes: 0 %
Lymphocytes Relative: 17 %
Lymphs Abs: 1.3 10*3/uL (ref 0.7–4.0)
MCH: 31.1 pg (ref 26.0–34.0)
MCHC: 33.2 g/dL (ref 30.0–36.0)
MCV: 93.9 fL (ref 80.0–100.0)
Monocytes Absolute: 0.8 10*3/uL (ref 0.1–1.0)
Monocytes Relative: 10 %
Neutro Abs: 5.4 10*3/uL (ref 1.7–7.7)
Neutrophils Relative %: 72 %
Platelets: 235 10*3/uL (ref 150–400)
RBC: 3.95 MIL/uL (ref 3.87–5.11)
RDW: 13.5 % (ref 11.5–15.5)
WBC: 7.6 10*3/uL (ref 4.0–10.5)
nRBC: 0 % (ref 0.0–0.2)

## 2021-03-25 LAB — COMPREHENSIVE METABOLIC PANEL
ALT: 20 U/L (ref 0–44)
AST: 30 U/L (ref 15–41)
Albumin: 4.4 g/dL (ref 3.5–5.0)
Alkaline Phosphatase: 51 U/L (ref 38–126)
Anion gap: 8 (ref 5–15)
BUN: 27 mg/dL — ABNORMAL HIGH (ref 6–20)
CO2: 26 mmol/L (ref 22–32)
Calcium: 9.5 mg/dL (ref 8.9–10.3)
Chloride: 104 mmol/L (ref 98–111)
Creatinine, Ser: 0.92 mg/dL (ref 0.44–1.00)
GFR, Estimated: >60 mL/min (ref >60)
Glucose, Bld: 101 mg/dL — ABNORMAL HIGH (ref 70–99)
Potassium: 3.6 mmol/L (ref 3.5–5.1)
Sodium: 138 mmol/L (ref 135–145)
Total Bilirubin: 0.6 mg/dL (ref 0.3–1.2)
Total Protein: 7.4 g/dL (ref 6.5–8.1)

## 2021-03-25 LAB — RESP PANEL BY RT-PCR (FLU A&B, COVID) ARPGX2
Influenza A by PCR: NEGATIVE
Influenza B by PCR: NEGATIVE
SARS Coronavirus 2 by RT PCR: NEGATIVE

## 2021-03-25 LAB — CBG MONITORING, ED: Glucose-Capillary: 144 mg/dL — ABNORMAL HIGH (ref 70–99)

## 2021-03-25 LAB — ETHANOL: Alcohol, Ethyl (B): <10 mg/dL (ref <10)

## 2021-03-25 MED ORDER — STERILE WATER FOR INJECTION IJ SOLN: 1.2000 mL | Freq: Once | INTRAMUSCULAR | Status: DC | PRN

## 2021-03-25 MED ORDER — LAMOTRIGINE 100 MG PO TABS
200.0000 mg | ORAL_TABLET | Freq: Every day | ORAL | Status: DC
  Administered 2021-03-26 – 2021-03-27 (×2): 200 mg via ORAL
  Filled 2021-03-25 (×2): qty 2

## 2021-03-25 MED ORDER — QUETIAPINE FUMARATE 300 MG PO TABS
600.0000 mg | ORAL_TABLET | Freq: Every day | ORAL | Status: DC
  Administered 2021-03-25 – 2021-03-26 (×2): 600 mg via ORAL
  Filled 2021-03-25 (×2): qty 2

## 2021-03-25 MED ORDER — AMPHETAMINE-DEXTROAMPHETAMINE 10 MG PO TABS
15.0000 mg | ORAL_TABLET | Freq: Two times a day (BID) | ORAL | Status: DC
  Administered 2021-03-26 – 2021-03-27 (×2): 15 mg via ORAL
  Filled 2021-03-25 (×3): qty 2

## 2021-03-25 MED ORDER — ZIPRASIDONE MESYLATE 20 MG IM SOLR: 20.0000 mg | Freq: Once | INTRAMUSCULAR | Status: DC | PRN

## 2021-03-25 MED ORDER — LORAZEPAM 2 MG/ML IJ SOLN
2.0000 mg | Freq: Once | INTRAMUSCULAR | Status: AC
  Administered 2021-03-25: 2 mg via INTRAMUSCULAR

## 2021-03-25 MED ORDER — PAROXETINE HCL 20 MG PO TABS
20.0000 mg | ORAL_TABLET | Freq: Every day | ORAL | Status: DC
  Administered 2021-03-25 – 2021-03-26 (×2): 20 mg via ORAL
  Filled 2021-03-25 (×2): qty 1

## 2021-03-25 MED ORDER — TRAZODONE HCL 100 MG PO TABS
100.0000 mg | ORAL_TABLET | Freq: Every evening | ORAL | Status: DC | PRN
  Administered 2021-03-25 – 2021-03-26 (×2): 100 mg via ORAL
  Filled 2021-03-25 (×2): qty 1

## 2021-03-25 MED ORDER — MELATONIN 5 MG PO TABS
5.0000 mg | ORAL_TABLET | Freq: Every day | ORAL | Status: DC
  Administered 2021-03-25 – 2021-03-26 (×2): 5 mg via ORAL
  Filled 2021-03-25 (×2): qty 1

## 2021-03-25 NOTE — ED Notes (Signed)
Pt back in ED exam room from bathroom.

## 2021-03-25 NOTE — ED Notes (Addendum)
Patient walked out of room to use restroom. When walking back, patient was going to ask a question, stopped, stated, "Nevermind" and then proceeded to attempt to walk down the hall. Attempted to verbally redirect patient, but stated, "No, I'm okay," clapped her hands together and continued to proceed down the hall. When this RN attempted to stop patient, patient attempted to push this RN and go to [employer]. This RN redirected patient back to room. PA notified, verbal order for 2 mg ativan given. Medication administered.
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
## 2021-03-25 NOTE — ED Notes (Signed)
Pt was asking for something with which to journal. She was provided with crayons and printer paper. She did not feel that this is acceptable. We explained that this is all we are allowed to offer to her.

## 2021-03-25 NOTE — ED Notes (Signed)
Pt up and ambulatory to bathroom without assistance.

## 2021-03-25 NOTE — ED Notes (Signed)
Pt up and ambulatory to bathroom without standby assist.

## 2021-03-25 NOTE — ED Notes (Signed)
Pt moved to TCU at this time with Charge Nurse Sebastian Ache, RN., accompanying.

## 2021-03-25 NOTE — ED Notes (Signed)
Pt given meal tray.

## 2021-03-25 NOTE — ED Notes (Signed)
This nurse entered the pt's room after the pt exited exam room asking for a ham sandwich. Pt's breakfast tray was on the bedside table un-eaten and this nurse asked the pt if she would like her breakfast tray, which this nurse showed the pt was on her bedside table within reach The pt then stated, "oh I thought it was 10:00 at night." This nurse then told the pt she would leave the room so the pt could eat her breakfast.
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
## 2021-03-25 NOTE — ED Notes (Signed)
Pt corporative when getting vitals. Pt is now resting.

## 2021-03-25 NOTE — ED Notes (Signed)
This nurse explained to the pt that she is not allowed to have her personal cell phone, per facility protocol.
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
## 2021-03-25 NOTE — ED Notes (Signed)
Pt went to the bathroom, but we were unable to obtain a urine sample.

## 2021-03-25 NOTE — ED Notes (Signed)
Report called to Lum Babe, RN.

## 2021-03-25 NOTE — BH Assessment (Signed)
[facility] Assessment Progress Note   Per Roselyn Bering, NP, this pt requires psychiatric hospitalization at a facility providing specialty care for geriatric patients this time.  Pt presents under IVC initiated by law enforcement and upheld by EDP Geoffery Lyons, MD.  The following facilities have been contacted to seek placement for this pt, with results as noted:  Beds available, information sent, decision pending: [facility] [employer] [facility]   Doylene Canning, [place] [facility] Coordinator [phone]

## 2021-03-25 NOTE — ED Notes (Signed)
Pt has been calm and resting. No s/s of distress.

## 2021-03-25 NOTE — ED Notes (Signed)
Pt is awake, alert and oriented x3. She says that she can't remember anything before this moment today.

## 2021-03-25 NOTE — ED Notes (Signed)
Pt declined lunch tray and asked for ham sandwich. This nurse explained that there are unfortunately no sandwiches in the dept. Pt given graham crackers and peanut butter as per requested. Pt's daughter at the bedside to witness.

## 2021-03-25 NOTE — ED Notes (Signed)
This nurse entered the pt's room to find her talking on the phone and appearing agitated. This nurse stated to the pt that she was told by nightshift that phone call hours started at 10:00AM and the pt had now been on the phone for around 30 minutes given that this nurse could hear the pt loudly talking on the phone from outside the exam room. This nurse calmly re-explained to the pt that phone call hours did not start til 10:00AM however the pt would not need to hang the phone up and this particular phone call counted as one of three phone calls today. The pt then stated to the nurse that she did not understand why she was not allowed to have more than three phone calls because "at behavioral health we are allowed to make as many as we want." This nurse explained to the pt that this is the emergency department and unfortunately three phone calls a day is protocol. The pt then stood up from the bedside chair and sat back down on the ED stretcher.

## 2021-03-25 NOTE — ED Notes (Signed)
Patient is now alert, speaking in full sentences.

## 2021-03-25 NOTE — ED Notes (Signed)
Pt to room 28. Pt oriented to unit and room. Pt tearful. Pt cooperative. Pt resting in bed comfortably

## 2021-03-26 LAB — URINALYSIS, ROUTINE W REFLEX MICROSCOPIC
Bilirubin Urine: NEGATIVE
Glucose, UA: NEGATIVE mg/dL
Hgb urine dipstick: NEGATIVE
Ketones, ur: NEGATIVE mg/dL
Leukocytes,Ua: NEGATIVE
Nitrite: NEGATIVE
Protein, ur: NEGATIVE mg/dL
Specific Gravity, Urine: <1.005 — ABNORMAL LOW (ref 1.005–1.030)
pH: 6 (ref 5.0–8.0)

## 2021-03-26 LAB — RAPID URINE DRUG SCREEN, HOSP PERFORMED
Amphetamines: POSITIVE — AB
Barbiturates: NOT DETECTED
Benzodiazepines: NOT DETECTED
Cocaine: NOT DETECTED
Opiates: NOT DETECTED
Tetrahydrocannabinol: NOT DETECTED

## 2021-03-26 MED ORDER — LEVOTHYROXINE SODIUM 137 MCG PO TABS
137.0000 ug | ORAL_TABLET | Freq: Every day | ORAL | Status: DC
  Administered 2021-03-26 – 2021-03-27 (×2): 137 ug via ORAL
  Filled 2021-03-26 (×2): qty 1

## 2021-03-26 NOTE — Progress Notes (Addendum)
Pt was accepted to [facility]; Geriatric Unit: [address] Juventino [place], [place] [address] for admission on 03/27/21 anytime.  Pt meets inpatient criteria per Roselyn Bering, NP  Attending Physician will be Dr. Joycelyn Rua   Report can be called to: [phone]  Pt can arrive after anytime tomorrow 03/27/21  CSW tired multiple times to send UDS via fax and email. Both were unsuccessful. Results were discussed from UDS verbally. Misty Stanley from [facility] advised to send UDS with pt in a packet when pt is being transported along with IVC paperwork.   Nursing notified Alyson Reedy, RN via secure chat. CSW requested that nursing add EDP to chat for update and coordinate transportation. Ernie Avena, MD and Melene Muller, RN were added to the chat.  Kelton Pillar, LCSWA 03/26/2021 @ 10:16 PM

## 2021-03-26 NOTE — ED Provider Notes (Signed)
Emergency Medicine Observation Re-evaluation Note  Lindsay [name] is a 60 y.o. female, seen on rounds today.  Pt initially presented to the ED for complaints of Altered Mental Status Currently, the patient is resting comfortably.  Physical Exam  BP 106/77 (BP Location: Left Arm)   Pulse 72   Temp (!) 97.5 F (36.4 C)   Resp 16   Ht 5\' 6"  (1.676 m)   Wt 88.5 kg   SpO2 97%   BMI 31.47 kg/m  Physical Exam General: No acute distress Cardiac: Well-perfused Lungs: Nonlabored Psych: Calm  ED Course / MDM  EKG:   I have reviewed the labs performed to date as well as medications administered while in observation.  Recent changes in the last 24 hours include psychiatric evaluation.  Plan  Current plan is for inpatient bed placement.  is under involuntary commitment.     Lindsay Mccallum, MD 03/26/21 [phone]

## 2021-03-26 NOTE — ED Notes (Signed)
Pt husband is visiting

## 2021-03-26 NOTE — Progress Notes (Signed)
CSW attempted to fax UDS report to [facility] fax: [phone] however CSW received multiple No Answer responses through fax. CSW sent a secure email to [employer] ([email or web address]) with [facility] for pending admission. CSW will assist and follow.   Maryjean Ka, MSW, LCSWA 03/26/2021 10:00 PM

## 2021-03-26 NOTE — ED Notes (Signed)
Urinalysis, UDS and Covid results were faxed to [facility] for possible admission

## 2021-03-26 NOTE — Progress Notes (Signed)
CSW sent Lindsay [name] requested UA. Lindsay [name] pending bed offer of UA and UDS. CSW will assist and follow up.  Maryjean Ka, MSW, [facility] 03/26/2021 7:44 PM

## 2021-03-27 DIAGNOSIS — F909 Attention-deficit hyperactivity disorder, unspecified type: Secondary | ICD-10-CM | POA: Diagnosis not present

## 2021-03-27 DIAGNOSIS — Z046 Encounter for general psychiatric examination, requested by authority: Secondary | ICD-10-CM | POA: Diagnosis not present

## 2021-03-27 DIAGNOSIS — Z20822 Contact with and (suspected) exposure to covid-19: Secondary | ICD-10-CM | POA: Diagnosis not present

## 2021-03-27 DIAGNOSIS — Z79899 Other long term (current) drug therapy: Secondary | ICD-10-CM | POA: Diagnosis not present

## 2021-03-27 DIAGNOSIS — F319 Bipolar disorder, unspecified: Secondary | ICD-10-CM | POA: Diagnosis not present

## 2021-03-27 DIAGNOSIS — E785 Hyperlipidemia, unspecified: Secondary | ICD-10-CM | POA: Diagnosis not present

## 2021-03-27 DIAGNOSIS — F600 Paranoid personality disorder: Secondary | ICD-10-CM | POA: Diagnosis not present

## 2021-03-27 DIAGNOSIS — N393 Stress incontinence (female) (male): Secondary | ICD-10-CM | POA: Diagnosis not present

## 2021-03-27 DIAGNOSIS — Z87448 Personal history of other diseases of urinary system: Secondary | ICD-10-CM | POA: Diagnosis not present

## 2021-03-27 DIAGNOSIS — F429 Obsessive-compulsive disorder, unspecified: Secondary | ICD-10-CM | POA: Diagnosis not present

## 2021-03-27 DIAGNOSIS — E039 Hypothyroidism, unspecified: Secondary | ICD-10-CM | POA: Diagnosis not present

## 2021-03-27 DIAGNOSIS — G4700 Insomnia, unspecified: Secondary | ICD-10-CM | POA: Diagnosis not present

## 2021-03-27 DIAGNOSIS — R4182 Altered mental status, unspecified: Secondary | ICD-10-CM | POA: Diagnosis not present

## 2021-03-27 DIAGNOSIS — F312 Bipolar disorder, current episode manic severe with psychotic features: Secondary | ICD-10-CM | POA: Diagnosis not present

## 2021-03-27 DIAGNOSIS — F22 Delusional disorders: Secondary | ICD-10-CM | POA: Diagnosis not present

## 2021-03-27 DIAGNOSIS — F309 Manic episode, unspecified: Secondary | ICD-10-CM | POA: Diagnosis not present

## 2021-03-27 DIAGNOSIS — G47419 Narcolepsy without cataplexy: Secondary | ICD-10-CM | POA: Diagnosis not present

## 2021-03-27 NOTE — ED Notes (Signed)
Spoke with Misty Stanley at [facility]. Printed U/A and UDS and attempted to be faxed to same number by secretary. Would not go through. RN gave positive UDS -amphetamines results to [place]. Patient is currently taking Adderall

## 2021-03-27 NOTE — ED Notes (Addendum)
Sheriff called for transport  

## 2021-03-27 NOTE — ED Provider Notes (Signed)
Emergency Medicine Observation Re-evaluation Note  Lindsay [name] is a 60 y.o. female, seen on rounds today.  Pt initially presented to the ED for complaints of Altered Mental Status Currently, the patient is resting comfortably.  Physical Exam  BP (!) 97/58 (BP Location: Left Arm)   Pulse 83   Temp 97.6 F (36.4 C) (Oral)   Resp 16   Ht 5\' 6"  (1.676 m)   Wt 88.5 kg   SpO2 99%   BMI 31.47 kg/m  Physical Exam General: Nontoxic appearance Cardiac: Normal heart rate Lungs: Normal respiratory Psych: Not responding to internal stimuli  ED Course / MDM  EKG:   I have reviewed the labs performed to date as well as medications administered while in observation.  Recent changes in the last 24 hours include cooperative.  Plan  Current plan is for transfer to a psychiatric hospital.  is  under involuntary commitment.     Satira Mccallum, MD 03/27/21 1004

## 2021-03-27 NOTE — ED Notes (Signed)
Report called to [facility], at [facility]

## 2021-03-29 ENCOUNTER — Telehealth: Payer: Self-pay | Admitting: Physician Assistant

## 2021-03-29 NOTE — Telephone Encounter (Signed)
Anicia is in patient treatment at [facility] in [place]. Joycelyn Rua wants to speak to you . Please call him at [phone]

## 2021-03-29 NOTE — Telephone Encounter (Signed)
I spoke w/ Dr. Sherryle Lis.  He gave me a synopsis of what led Lindsay [name] to the hospital there.  We briefly discussed the narcolepsy requiring a stimulant, that has been stopped and psychotic symptoms have already improved within just a day or so.  Seroquel was increased when she was at [facility] up to 600 mg nightly.  The Paxil was decreased to 30 mg I believe he said.  They are looking for placement at [facility] in [place] after she leaves the hospital.

## 2021-04-01 ENCOUNTER — Telehealth: Payer: Self-pay | Admitting: Physician Assistant

## 2021-04-01 NOTE — Telephone Encounter (Signed)
LM for him to call back

## 2021-04-01 NOTE — Telephone Encounter (Signed)
Please triage. See my note w/ Dr. Sherryle Lis from 03/29/2021. Thanks.

## 2021-04-01 NOTE — Telephone Encounter (Signed)
Pt's husband Roland Earl on VM stating she is in [facility] facility and would like to have a return call @ [phone].

## 2021-04-02 ENCOUNTER — Ambulatory Visit: Payer: BC Managed Care – PPO | Admitting: Physician Assistant

## 2021-04-03 LAB — HEAVY METALS PROFILE, URINE
Arsenic (Total),U: 17 ug/L — ABNORMAL HIGH (ref 0–9)
Arsenic, 24H Ur: 0 ug/24 hr (ref 0–50)
Creatinine(Crt),U: 1.2 g/L (ref 0.30–3.00)
Inorg. As/Crt Ratio: 14 ug/g creat
Lead, Rand Ur: NOT DETECTED ug/L (ref 0–49)
Mercury, Ur: NOT DETECTED ug/L (ref 0–19)

## 2021-04-04 ENCOUNTER — Encounter: Payer: Self-pay | Admitting: Physician Assistant

## 2021-04-04 ENCOUNTER — Ambulatory Visit: Payer: BC Managed Care – PPO | Admitting: Physician Assistant

## 2021-04-04 ENCOUNTER — Other Ambulatory Visit: Payer: Self-pay

## 2021-04-04 DIAGNOSIS — G47419 Narcolepsy without cataplexy: Secondary | ICD-10-CM

## 2021-04-04 DIAGNOSIS — F3189 Other bipolar disorder: Secondary | ICD-10-CM

## 2021-04-04 DIAGNOSIS — F429 Obsessive-compulsive disorder, unspecified: Secondary | ICD-10-CM

## 2021-04-04 DIAGNOSIS — G4700 Insomnia, unspecified: Secondary | ICD-10-CM | POA: Diagnosis not present

## 2021-04-04 MED ORDER — ALPRAZOLAM 1 MG PO TABS: 0.5000 mg | ORAL_TABLET | Freq: Three times a day (TID) | ORAL | 1 refills | Status: DC | PRN

## 2021-04-04 MED ORDER — QUETIAPINE FUMARATE 400 MG PO TABS
800.0000 mg | ORAL_TABLET | Freq: Every day | ORAL | 1 refills | Status: DC
Start: 2021-04-04 — End: 2021-05-17

## 2021-04-04 MED ORDER — TRAZODONE HCL 100 MG PO TABS: 100.0000 mg | ORAL_TABLET | Freq: Every evening | ORAL | 1 refills | Status: DC | PRN
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
## 2021-04-07 ENCOUNTER — Telehealth: Payer: Self-pay | Admitting: Physician Assistant

## 2021-04-07 NOTE — Telephone Encounter (Signed)
Please call and see how she is after increasing the seroquel and adding xanax.  Also, I reviewed her labs from the recent hospitalizations and saw her TSH very high. Did anyone change her Synthroid dose? I can understand if not, d/t the mania and psychosis that needed to be dealt with first. But if the synthroid hasn't been changed, it needs to be.  I think her PCP prescribes that, could you confirm that please?  I do not think we have ever prescribed that.  She needs to contact the PCP if they are the one treating it.  Thank you.

## 2021-04-08 DIAGNOSIS — R350 Frequency of micturition: Secondary | ICD-10-CM | POA: Diagnosis not present

## 2021-04-09 NOTE — Telephone Encounter (Signed)
Left a message for pt to call back to discusse

## 2021-04-16 ENCOUNTER — Ambulatory Visit: Payer: BC Managed Care – PPO | Admitting: Physician Assistant

## 2021-04-16 ENCOUNTER — Other Ambulatory Visit: Payer: Self-pay

## 2021-04-16 ENCOUNTER — Encounter: Payer: Self-pay | Admitting: Physician Assistant

## 2021-04-16 DIAGNOSIS — F429 Obsessive-compulsive disorder, unspecified: Secondary | ICD-10-CM | POA: Diagnosis not present

## 2021-04-16 DIAGNOSIS — G47419 Narcolepsy without cataplexy: Secondary | ICD-10-CM

## 2021-04-16 DIAGNOSIS — F900 Attention-deficit hyperactivity disorder, predominantly inattentive type: Secondary | ICD-10-CM

## 2021-04-16 DIAGNOSIS — F3110 Bipolar disorder, current episode manic without psychotic features, unspecified: Secondary | ICD-10-CM | POA: Diagnosis not present

## 2021-04-16 DIAGNOSIS — G4700 Insomnia, unspecified: Secondary | ICD-10-CM

## 2021-04-16 NOTE — Progress Notes (Signed)
[facility]  Patient ID: Lindsay [name],  MRN: [id number]  PCP: Aliene Beams, MD  Date of Evaluation: 04/16/2021 Time spent:40 minutes  Chief Complaint:  Chief Complaint   Anxiety; Depression; Insomnia; Follow-up      HISTORY/CURRENT STATUS: HPI For 2 wk med check, after hosp. Husband, Lindsay [name] is with her.   See previous notes and hospital records.  She returns today 2 weeks after the last hospitalization and after increasing the Seroquel.  She is still manic but has no psychosis.  She is sleeping better using the Seroquel and Xanax.  When she takes the trazodone with those it makes her way too drowsy the next day.  States she feels well but just has a lot of energy and her husband says she is more talkative, and although she is better she is still manic and has never really come down from this past episode.  No impulsivity or risky behaviors now.  No increased spending.  I did not discuss libido.  No decreased need for sleep.  No paranoia.  No hallucinations.  Denies depression symptoms.  She is able to enjoy things.  Energy and motivation are good.  She does get tired during the day since being off the Adderall for narcolepsy, but it also could be from the trazodone.  She has gone without that several times and did feel less tired and sleepy the next day.  Not isolating.  Not crying easily.  No suicidal or homicidal thoughts.  At the last visit we had discussed the possibility of her going to [facility] in [place] which they had already looked at.  A representative from there has been in contact with them in the past week and felt like Lillyian is not a candidate to go there at this time.  Possibly due to her mania but I could not quite get that straight.  At any rate, she will be called once a week from the representative at [facility] and if appropriate she will be admitted there for inpatient intensive counseling.  After her last visit I reviewed her labs from hospitalization in  more detail.  I found that her TSH was very high.  She has known hypothyroidism and sees Dr. Talmage Coin endocrinologist here in town.  When discussing that today, she said that she had not been taking her thyroid medicines before her admissions but not able to tell me how long she was off of it for her when she restarted it.  Review of Systems  Constitutional: Negative.   HENT: Negative.    Eyes: Negative.   Respiratory: Negative.    Cardiovascular: Negative.   Gastrointestinal: Negative.   Genitourinary: Negative.   Musculoskeletal: Negative.   Skin: Negative.   Neurological: Negative.   Endo/Heme/Allergies: Negative.   Psychiatric/Behavioral:         See HPI.    Individual Medical History/ Review of Systems: Changes? :Yes   see HPI  Past medications for mental health diagnoses include: Geodon, Lamictal, Latuda, Paxil, trazodone, Ambien, Seroquel, Vraylar, Risperdal, Saphris, Depakote, Fanapt, Abilify, Rexulti, Wellbutrin, Provigil, Nuvigil, Vyvanse, Adderall, ECT  Allergies: Patient has no known allergies.  Current Medications:  Current Outpatient Medications:    ALPRAZolam (XANAX) 1 MG tablet, Take 0.5-1 tablets (0.5-1 mg total) by mouth 3 (three) times daily as needed for anxiety., Disp: 45 tablet, Rfl: 1   Cholecalciferol 125 MCG (5000 UT) capsule, Take 5,000 Units by mouth daily., Disp: , Rfl:    estradiol (ESTRACE) 0.5 MG tablet,  Take 0.5 tablets by mouth daily., Disp: , Rfl:    lamoTRIgine (LAMICTAL) 200 MG tablet, TAKE 1 TABLET(200 MG) BY MOUTH DAILY (Patient taking differently: Take 200 mg by mouth daily.), Disp: 90 tablet, Rfl: 3   levothyroxine (SYNTHROID) 137 MCG tablet, Take 137 mcg by mouth daily before breakfast., Disp: , Rfl:    liothyronine (CYTOMEL) 5 MCG tablet, Take 5 mcg by mouth daily., Disp: , Rfl:    PARoxetine (PAXIL) 20 MG tablet, Take 1 tablet (20 mg total) by mouth at bedtime., Disp: 30 tablet, Rfl: 0   progesterone (PROMETRIUM) 200 MG capsule, Take  200 mg by mouth daily., Disp: , Rfl:    QUEtiapine (SEROQUEL) 400 MG tablet, Take 2 tablets (800 mg total) by mouth at bedtime., Disp: 60 tablet, Rfl: 1   simvastatin (ZOCOR) 20 MG tablet, Take 20 mg by mouth at bedtime., Disp: , Rfl:    traZODone (DESYREL) 100 MG tablet, Take 1-2 tablets (100-200 mg total) by mouth at bedtime as needed for sleep., Disp: 60 tablet, Rfl: 1   vitamin B-12 (CYANOCOBALAMIN) 1000 MCG tablet, Take 1,000 mcg by mouth daily. (Patient not taking: Reported on 04/16/2021), Disp: , Rfl:  Medication Side Effects: none  Family Medical/ Social History: Changes? No  MENTAL HEALTH EXAM:  There were no vitals taken for this visit.There is no height or weight on file to calculate BMI.  General Appearance: Casual and Well Groomed  Eye Contact:  Good  Speech:  Clear and Coherent and Talkative  Volume:  Normal  Mood:  Euphoric  Affect:  Congruent and she is always upbeat but more so today with obvious mania.  Thought Process:  Goal Directed and Descriptions of Associations: Circumstantial  Orientation:  Full (Time, Place, and Person)  Thought Content: Logical   Suicidal Thoughts:  No  Homicidal Thoughts:  No  Memory:  WNL  Judgement:  Good  Insight:  Good  Psychomotor Activity:  Normal  Concentration:  Concentration: Fair and Attention Span: Fair  Recall:  Fair  Fund of Knowledge: Good  Language: Good  Assets:  Desire for Improvement  ADL's:  Intact  Cognition: WNL  Prognosis:  Good   03/16/2021 TSH 46.84 Discussed in detail with Harriett Sine and Lindsay [name]  DIAGNOSES:    ICD-10-CM   1. Bipolar I disorder, most recent episode (or current) manic (HCC)  F31.10     2. 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 Insomnia, unspecified type  G47.00     4. Attention deficit hyperactivity disorder (ADHD), predominantly inattentive type  F90.0     5. Primary narcolepsy without cataplexy  G47.419        Receiving Psychotherapy: Yes     RECOMMENDATIONS:  PDMP was reviewed.  Last Xanax filled 04/04/2021, last Adderall filled 02/24/2021. I provided 40 minutes of face to face time during this encounter, including time spent before and after the visit in records review, complex medical decision making, counseling pertinent to today's visit, and charting.  I wrote out the values for TSH, T3, and T4 which she will take to her endocrinologist office, asking them to repeat labs and see the endocrinologist as soon as she can.  Because she is hypothyroid, I do not think that is related to the mania but that needs to be reassessed and treated.  She and her husband understand.  [facility] states that she can go there today and they are good about drawing labs and getting her into see Dr. Sharl Ma, will  probably be able to in the next week. She is much better than she was the day after her hospital discharge 2 weeks ago.  She is still manic though and I will recommend increasing Seroquel to a total of 1000 mg, await labs from endocrinology though, just in case she has become hyperthyroidism which could explain the mania, at least in part. We also discussed admission to [facility].  She will be contacted once a week from one of their representatives and if/when they feel it would be appropriate for her to go then she will. We also discussed an antipsychotic injectable.  She has taken Risperdal and Abilify which are the only antipsychotics that also come as an injectable, although she has tried numerous other antipsychotics.  The Abilify and Risperdal caused weight gain and made her feel worse than she already did.  Seroquel has been the most effective for several years, we have needed to increase the dose several times however.  If increasing the Seroquel is not effective in a week or so then they should call and I will discuss with Dr. Jennelle Human which injectable antipsychotic would be appropriate for her. It is okay for her to take 1 Xanax with the Seroquel at  bedtime. Continue Xanax 1 mg, 1/2-1 3 times daily as needed anxiety or agitation.  Take the lowest effective amount. Continue Lamictal 200 mg, 1 p.o. daily. Continue Paxil 20 mg daily Continue Seroquel 400 mg, 2 p.o. nightly. Hold trazodone for now.  Continue multivitamin, B12, and vitamin D. Return in 4 weeks.  [name], PA-C

## 2021-04-19 NOTE — Telephone Encounter (Signed)
Pt had visit 04/16/21

## 2021-05-10 DIAGNOSIS — E039 Hypothyroidism, unspecified: Secondary | ICD-10-CM | POA: Diagnosis not present

## 2021-05-14 DIAGNOSIS — E049 Nontoxic goiter, unspecified: Secondary | ICD-10-CM | POA: Diagnosis not present

## 2021-05-14 DIAGNOSIS — E039 Hypothyroidism, unspecified: Secondary | ICD-10-CM | POA: Diagnosis not present

## 2021-05-17 ENCOUNTER — Other Ambulatory Visit: Payer: Self-pay

## 2021-05-17 ENCOUNTER — Ambulatory Visit: Payer: BC Managed Care – PPO | Admitting: Physician Assistant

## 2021-05-17 ENCOUNTER — Encounter: Payer: Self-pay | Admitting: Physician Assistant

## 2021-05-17 DIAGNOSIS — G4700 Insomnia, unspecified: Secondary | ICD-10-CM | POA: Diagnosis not present

## 2021-05-17 DIAGNOSIS — F900 Attention-deficit hyperactivity disorder, predominantly inattentive type: Secondary | ICD-10-CM

## 2021-05-17 DIAGNOSIS — F3110 Bipolar disorder, current episode manic without psychotic features, unspecified: Secondary | ICD-10-CM

## 2021-05-17 DIAGNOSIS — F429 Obsessive-compulsive disorder, unspecified: Secondary | ICD-10-CM

## 2021-05-17 MED ORDER — PAROXETINE HCL 10 MG PO TABS: 10.0000 mg | ORAL_TABLET | Freq: Every day | ORAL | 1 refills | Status: DC

## 2021-05-17 MED ORDER — QUETIAPINE FUMARATE 400 MG PO TABS: 1000.0000 mg | ORAL_TABLET | Freq: Every day | ORAL | 1 refills | Status: DC

## 2021-05-17 NOTE — Progress Notes (Signed)
[facility]  Patient ID: Lindsay [name],  MRN: [id number]  PCP: Aliene Beams, MD  Date of Evaluation: 05/17/2021 Time spent:40 minutes  Chief Complaint:  Chief Complaint   Anxiety; Depression; Insomnia; Follow-up      HISTORY/CURRENT STATUS: HPI For routine med check.  States the meds are working but has trouble sleeping and has lots of energy. Still enjoys hiking, energy and motivation are fine, sleeps ok but has to take the Xanax with the Trazodone to go to sleep and even then, doesn't feel rested. Thinks she's manic again. More impulsive, talkative, but no paranoia or AH/VH.  Patient denies loss of interest in usual activities and is able to enjoy things.  Denies decreased energy or motivation.  Appetite has not changed.  No extreme sadness, tearfulness, or feelings of hopelessness.  Denies any changes in concentration, making decisions or remembering things.  Denies suicidal or homicidal thoughts.  Needs the Xanax everynight but sometimes during day too. It's helpful.   Denies dizziness, syncope, seizures, numbness, tingling, tremor, tics, unsteady gait, slurred speech, confusion. Denies muscle or joint pain, stiffness, or dystonia.  Individual Medical History/ Review of Systems: Changes? :Yes   see HPI  Past medications for mental health diagnoses include: Geodon, Lamictal, Latuda, Paxil, trazodone, Ambien, Seroquel, Vraylar, Risperdal, Saphris, Depakote, Fanapt, Abilify, Rexulti, Wellbutrin, Provigil, Nuvigil, Vyvanse, Adderall, ECT  Allergies: Patient has no known allergies.  Current Medications:  Current Outpatient Medications:    ALPRAZolam (XANAX) 1 MG tablet, Take 0.5-1 tablets (0.5-1 mg total) by mouth 3 (three) times daily as needed for anxiety., Disp: 45 tablet, Rfl: 1   Cholecalciferol 125 MCG (5000 UT) capsule, Take 5,000 Units by mouth daily., Disp: , Rfl:    estradiol (ESTRACE) 0.5 MG tablet, Take 0.5 tablets by mouth daily., Disp: , Rfl:     lamoTRIgine (LAMICTAL) 200 MG tablet, TAKE 1 TABLET(200 MG) BY MOUTH DAILY (Patient taking differently: Take 200 mg by mouth daily.), Disp: 90 tablet, Rfl: 3   levothyroxine (SYNTHROID) 137 MCG tablet, Take 137 mcg by mouth daily before breakfast., Disp: , Rfl:    liothyronine (CYTOMEL) 5 MCG tablet, Take 5 mcg by mouth daily., Disp: , Rfl:    PARoxetine (PAXIL) 10 MG tablet, Take 1 tablet (10 mg total) by mouth daily., Disp: 30 tablet, Rfl: 1   progesterone (PROMETRIUM) 200 MG capsule, Take 200 mg by mouth daily., Disp: , Rfl:    simvastatin (ZOCOR) 20 MG tablet, Take 20 mg by mouth at bedtime., Disp: , Rfl:    traZODone (DESYREL) 100 MG tablet, Take 1-2 tablets (100-200 mg total) by mouth at bedtime as needed for sleep., Disp: 60 tablet, Rfl: 1   QUEtiapine (SEROQUEL) 400 MG tablet, Take 2.5 tablets (1,000 mg total) by mouth at bedtime., Disp: 75 tablet, Rfl: 1   vitamin B-12 (CYANOCOBALAMIN) 1000 MCG tablet, Take 1,000 mcg by mouth daily., Disp: , Rfl:  Medication Side Effects: none  Family Medical/ Social History: Changes? No  MENTAL HEALTH EXAM:  There were no vitals taken for this visit.There is no height or weight on file to calculate BMI.  General Appearance: Casual and Well Groomed  Eye Contact:  Good  Speech:  Clear and Coherent, Pressured, and Talkative  Volume:  Increased  Mood:  Euphoric  Affect:  Labile  Thought Process:  Goal Directed and Descriptions of Associations: Circumstantial  Orientation:  Full (Time, Place, and Person)  Thought Content: Ideas of Reference:   Paranoia, Obsessions, Paranoid Ideation, Rumination, and Tangential  Suicidal Thoughts:  No  Homicidal Thoughts:  No  Memory:  WNL  Judgement:  Good  Insight:  Good  Psychomotor Activity:  Normal  Concentration:  Concentration: Fair and Attention Span: Fair  Recall:  [employer] of Knowledge: Good  Language: Good  Assets:  Desire for Improvement  ADL's:  Intact  Cognition: WNL  Prognosis:  Good     DIAGNOSES:    ICD-10-CM   1. Bipolar I disorder, most recent episode (or current) manic (HCC)  F31.10     2. 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 Insomnia, unspecified type  G47.00     4. Attention deficit hyperactivity disorder (ADHD), predominantly inattentive type  F90.0         Receiving Psychotherapy: Yes   Hal Neer   RECOMMENDATIONS:  PDMP was reviewed.  Last Xanax filled 05/09/2021, last Adderall filled 02/24/2021. I provided 40 minutes of face to face time during this encounter, including time spent before and after the visit in records review, medical decision making, counseling pertinent to today's visit, and charting.  She is still manic and I recommend increasing the Seroquel.  Also think decreasing Paxil might improve the manic symptoms as well.  She has been on Paxil for many years because of severe scrupulosity OCD but at this point it is crucial that we treat the mania so hopefully she will not end up back in the hospital.  She understands and agrees. Increase Seroquel 400 mg to 2.5 pills qhs. (She was made aware that this is 200 mg over the usual maximum dose however sometimes we have to increase to this) Continue Xanax 1 mg, 1/2-1 3 times daily as needed anxiety or agitation.  Take the lowest effective amount. Continue Lamictal 200 mg, 1 p.o. daily. Decrease Paxil to 10 mg, 1 qd. Might wean off completely.  Continue multivitamin, B12, and vitamin D. Continue therapy. Return in 2 weeks.  [name], PA-C

## 2021-05-20 DIAGNOSIS — N390 Urinary tract infection, site not specified: Secondary | ICD-10-CM | POA: Diagnosis not present

## 2021-05-30 ENCOUNTER — Ambulatory Visit: Payer: BC Managed Care – PPO | Admitting: Physician Assistant

## 2021-06-03 ENCOUNTER — Ambulatory Visit: Payer: BC Managed Care – PPO | Admitting: Physician Assistant

## 2021-06-03 ENCOUNTER — Other Ambulatory Visit: Payer: Self-pay | Admitting: Physician Assistant

## 2021-06-03 NOTE — Telephone Encounter (Signed)
Lindsay [name] called because the pharmacy told her she can't pick her Xanax prescription until 12/10.  She only has 2 days left so is concerned about not having the medication for 3-4 days if she has to wait until the 10th.  She said she doesn't abuse the medication so doesn't understand why she is out early.  Would you please approve an early refill so she won't have to go without.

## 2021-06-04 NOTE — Telephone Encounter (Signed)
Last filled 11/10 please review message

## 2021-06-06 DIAGNOSIS — R399 Unspecified symptoms and signs involving the genitourinary system: Secondary | ICD-10-CM | POA: Diagnosis not present

## 2021-06-10 DIAGNOSIS — F3132 Bipolar disorder, current episode depressed, moderate: Secondary | ICD-10-CM | POA: Diagnosis not present

## 2021-06-11 DIAGNOSIS — F3132 Bipolar disorder, current episode depressed, moderate: Secondary | ICD-10-CM | POA: Diagnosis not present

## 2021-06-12 DIAGNOSIS — F3132 Bipolar disorder, current episode depressed, moderate: Secondary | ICD-10-CM | POA: Diagnosis not present

## 2021-06-13 DIAGNOSIS — F3132 Bipolar disorder, current episode depressed, moderate: Secondary | ICD-10-CM | POA: Diagnosis not present

## 2021-06-14 DIAGNOSIS — F3132 Bipolar disorder, current episode depressed, moderate: Secondary | ICD-10-CM | POA: Diagnosis not present

## 2021-06-17 DIAGNOSIS — F3132 Bipolar disorder, current episode depressed, moderate: Secondary | ICD-10-CM | POA: Diagnosis not present

## 2021-06-18 DIAGNOSIS — F3132 Bipolar disorder, current episode depressed, moderate: Secondary | ICD-10-CM | POA: Diagnosis not present

## 2021-06-19 DIAGNOSIS — F3132 Bipolar disorder, current episode depressed, moderate: Secondary | ICD-10-CM | POA: Diagnosis not present

## 2021-06-20 DIAGNOSIS — F3132 Bipolar disorder, current episode depressed, moderate: Secondary | ICD-10-CM | POA: Diagnosis not present

## 2021-06-21 DIAGNOSIS — F3132 Bipolar disorder, current episode depressed, moderate: Secondary | ICD-10-CM | POA: Diagnosis not present

## 2021-06-25 DIAGNOSIS — F3132 Bipolar disorder, current episode depressed, moderate: Secondary | ICD-10-CM | POA: Diagnosis not present

## 2021-06-26 DIAGNOSIS — N8110 Cystocele, unspecified: Secondary | ICD-10-CM | POA: Diagnosis not present

## 2021-06-26 DIAGNOSIS — R300 Dysuria: Secondary | ICD-10-CM | POA: Diagnosis not present

## 2021-06-26 DIAGNOSIS — N3090 Cystitis, unspecified without hematuria: Secondary | ICD-10-CM | POA: Diagnosis not present

## 2021-06-27 DIAGNOSIS — F3132 Bipolar disorder, current episode depressed, moderate: Secondary | ICD-10-CM | POA: Diagnosis not present

## 2021-06-28 DIAGNOSIS — F3132 Bipolar disorder, current episode depressed, moderate: Secondary | ICD-10-CM | POA: Diagnosis not present

## 2021-07-02 ENCOUNTER — Telehealth: Payer: Self-pay | Admitting: Physician Assistant

## 2021-07-02 NOTE — Telephone Encounter (Signed)
Patient lm requesting a refill on the Xanax. She states she only has six remaining due to how the Rx was written. She will be out before her appointment on 1/11. Please advise # [id number].

## 2021-07-02 NOTE — Telephone Encounter (Signed)
Please review.On 12/7 a rx was sent as TAKE 1/2 TO 1 TABLET(0.5 TO 1 MG) BY MOUTH THREE TIMES DAILY AS NEEDED FOR ANXIETY but only #45 tabs were sent

## 2021-07-02 NOTE — Telephone Encounter (Signed)
She has refills.  This should not be necessary but please call her pharmacy and tell them to go ahead and fill it.  She and I will discuss further at our next appointment.  Thanks.

## 2021-07-08 DIAGNOSIS — M542 Cervicalgia: Secondary | ICD-10-CM | POA: Diagnosis not present

## 2021-07-08 DIAGNOSIS — M9901 Segmental and somatic dysfunction of cervical region: Secondary | ICD-10-CM | POA: Diagnosis not present

## 2021-07-08 DIAGNOSIS — M546 Pain in thoracic spine: Secondary | ICD-10-CM | POA: Diagnosis not present

## 2021-07-08 DIAGNOSIS — M9902 Segmental and somatic dysfunction of thoracic region: Secondary | ICD-10-CM | POA: Diagnosis not present

## 2021-07-09 DIAGNOSIS — Z9884 Bariatric surgery status: Secondary | ICD-10-CM | POA: Diagnosis not present

## 2021-07-09 DIAGNOSIS — Z1211 Encounter for screening for malignant neoplasm of colon: Secondary | ICD-10-CM | POA: Diagnosis not present

## 2021-07-09 DIAGNOSIS — R7303 Prediabetes: Secondary | ICD-10-CM | POA: Diagnosis not present

## 2021-07-09 DIAGNOSIS — E785 Hyperlipidemia, unspecified: Secondary | ICD-10-CM | POA: Diagnosis not present

## 2021-07-09 DIAGNOSIS — Z0000 Encounter for general adult medical examination without abnormal findings: Secondary | ICD-10-CM | POA: Diagnosis not present

## 2021-07-09 DIAGNOSIS — Z23 Encounter for immunization: Secondary | ICD-10-CM | POA: Diagnosis not present

## 2021-07-10 ENCOUNTER — Ambulatory Visit (INDEPENDENT_AMBULATORY_CARE_PROVIDER_SITE_OTHER): Payer: BC Managed Care – PPO | Admitting: Physician Assistant

## 2021-07-10 ENCOUNTER — Other Ambulatory Visit: Payer: Self-pay

## 2021-07-10 ENCOUNTER — Encounter: Payer: Self-pay | Admitting: Physician Assistant

## 2021-07-10 DIAGNOSIS — F3110 Bipolar disorder, current episode manic without psychotic features, unspecified: Secondary | ICD-10-CM | POA: Diagnosis not present

## 2021-07-10 DIAGNOSIS — F900 Attention-deficit hyperactivity disorder, predominantly inattentive type: Secondary | ICD-10-CM

## 2021-07-10 DIAGNOSIS — G4700 Insomnia, unspecified: Secondary | ICD-10-CM | POA: Diagnosis not present

## 2021-07-10 DIAGNOSIS — F429 Obsessive-compulsive disorder, unspecified: Secondary | ICD-10-CM | POA: Diagnosis not present

## 2021-07-10 DIAGNOSIS — G47419 Narcolepsy without cataplexy: Secondary | ICD-10-CM

## 2021-07-10 MED ORDER — QUETIAPINE FUMARATE 300 MG PO TABS: 1050.0000 mg | ORAL_TABLET | Freq: Every day | ORAL | 1 refills | Status: DC

## 2021-07-10 MED ORDER — ATOMOXETINE HCL 25 MG PO CAPS: 25.0000 mg | ORAL_CAPSULE | Freq: Every day | ORAL | 1 refills | Status: DC

## 2021-07-10 NOTE — Progress Notes (Signed)
[facility]  Patient ID: Lindsay [name],  MRN: [id number]  PCP: Caren Macadam, MD  Date of Evaluation: 07/10/2021 Time spent:40 minutes  Chief Complaint:  Chief Complaint   Anxiety; Depression; Insomnia; Follow-up     HISTORY/CURRENT STATUS: HPI For routine med check.  At [facility] in Nov. Seroquel was increased. She was supposed to have returned in 2 weeks, she doesn't remember why she didn't come. Has been more forgetful, not able to focus on things, and is more tired than typical.  She was on Adderall more so for narcolepsy but ADHD as well, until the last hospitalization when she was psychotic.  Reports being very scattered, not able to finish one task before she starts something else.  She has to try really hard to get something done without being distracted so much.  Patient denies loss of interest in usual activities and is able to enjoy things.  She is more tired and has less motivation than normal.  This seems to be getting worse over the past month or so.  Personal hygiene is normal and ADLs are normal though.  Appetite has not changed.  No extreme sadness, tearfulness, or feelings of hopelessness. Denies suicidal or homicidal thoughts.  She does have anxiety, specifically obsessions are more prevalent now that the Paxil has been decreased, the OCD has worsened.  She reports concern for germs now where it usually was not that bad.  She still has scrupulosity OCD, but states it is not debilitating right now.  She does take the Xanax when needed.  Has needed it more lately than in the past.  States she does her best not to take it unless it is absolutely necessary.  Patient denies increased energy with decreased need for sleep, no increased talkativeness, no racing thoughts, no impulsivity or risky behaviors, no increased spending, no increased libido, no grandiosity, no increased irritability or anger, no paranoia, and no hallucinations.  Review of Systems  Constitutional:   Positive for malaise/fatigue.  HENT: Negative.    Eyes: Negative.   Respiratory: Negative.    Cardiovascular: Negative.   Gastrointestinal: Negative.   Genitourinary: Negative.   Musculoskeletal: Negative.   Skin: Negative.   Neurological: Negative.   Endo/Heme/Allergies: Negative.   Psychiatric/Behavioral:         See HPI.   Individual Medical History/ Review of Systems: Changes? :No     Past medications for mental health diagnoses include: Geodon, Lamictal, Latuda, Paxil, trazodone, Ambien, Seroquel, Vraylar, Risperdal, Saphris, Depakote, Fanapt, Abilify, Rexulti, Wellbutrin, Provigil, Nuvigil, Vyvanse, Adderall (stopped when she was hospitalized for psychosis 02/2021)  ECT  Allergies: Patient has no known allergies.  Current Medications:  Current Outpatient Medications:    ALPRAZolam (XANAX) 1 MG tablet, TAKE 1/2 TO 1 TABLET(0.5 TO 1 MG) BY MOUTH THREE TIMES DAILY AS NEEDED FOR ANXIETY, Disp: 45 tablet, Rfl: 5   atomoxetine (STRATTERA) 25 MG capsule, Take 1 capsule (25 mg total) by mouth daily., Disp: 30 capsule, Rfl: 1   Cholecalciferol 125 MCG (5000 UT) capsule, Take 5,000 Units by mouth daily., Disp: , Rfl:    estradiol (ESTRACE) 0.5 MG tablet, Take 0.5 tablets by mouth daily., Disp: , Rfl:    lamoTRIgine (LAMICTAL) 200 MG tablet, TAKE 1 TABLET(200 MG) BY MOUTH DAILY (Patient taking differently: Take 200 mg by mouth daily.), Disp: 90 tablet, Rfl: 3   levothyroxine (SYNTHROID) 137 MCG tablet, Take 137 mcg by mouth daily before breakfast., Disp: , Rfl:    liothyronine (CYTOMEL) 5 MCG tablet, Take  5 mcg by mouth daily., Disp: , Rfl:    PARoxetine (PAXIL) 10 MG tablet, Take 1 tablet (10 mg total) by mouth daily., Disp: 30 tablet, Rfl: 1   progesterone (PROMETRIUM) 200 MG capsule, Take 200 mg by mouth daily., Disp: , Rfl:    QUEtiapine (SEROQUEL) 300 MG tablet, Take 3.5 tablets (1,050 mg total) by mouth at bedtime., Disp: 105 tablet, Rfl: 1   simvastatin (ZOCOR) 20 MG tablet, Take  20 mg by mouth at bedtime., Disp: , Rfl:    traZODone (DESYREL) 100 MG tablet, Take 1-2 tablets (100-200 mg total) by mouth at bedtime as needed for sleep., Disp: 60 tablet, Rfl: 1   vitamin B-12 (CYANOCOBALAMIN) 1000 MCG tablet, Take 1,000 mcg by mouth daily., Disp: , Rfl:  Medication Side Effects: none  Family Medical/ Social History: Changes?   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  She is excited about that. She and her husband are doing a legal separation but not divorcing yet.  States it is not an option financially for her right now.  MENTAL HEALTH EXAM:  There were no vitals taken for this visit.There is no height or weight on file to calculate BMI.  General Appearance: Casual and Well Groomed  Eye Contact:  Good  Speech:  Clear and Coherent, Pressured, and Talkative  Volume:  Normal  Mood:  Euthymic  Affect:  Congruent  Thought Process:  Goal Directed and Descriptions of Associations: Circumstantial  Orientation:  Full (Time, Place, and Person)  Thought Content: Logical, Obsessions, and Rumination   Suicidal Thoughts:  No  Homicidal Thoughts:  No  Memory:  WNL  Judgement:  Good  Insight:  Good  Psychomotor Activity:  Normal  Concentration:  Concentration: Fair and Attention Span: Fair  Recall:  [employer] of Knowledge: Good  Language: Good  Assets:  Desire for Improvement  ADL's:  Intact  Cognition: WNL  Prognosis:  Good    DIAGNOSES:    ICD-10-CM   1. 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 Attention deficit hyperactivity disorder (ADHD), predominantly inattentive type  F90.0     3. Bipolar I disorder, most recent episode (or current) manic ([place])  F31.10     4. Insomnia, unspecified type  G47.00     5. Primary narcolepsy without cataplexy  G47.419        Receiving Psychotherapy: Yes   Kandra Nicolas   RECOMMENDATIONS:  PDMP was reviewed.  Last Xanax filled 07/04/2021, last Adderall filled 02/24/2021. I provided 40  minutes of face to face time during this encounter, including time spent before and after the visit in records review, medical decision making, counseling pertinent to today's visit, and charting.  We discussed different options for treating the narcolepsy/ADHD.  Unfortunately treating with a stimulant is not an option for now, although I am not completely convinced that the stimulant had anything to do with the psychotic episode she had in September 2022.  She had been on Vyvanse even before 03/30/2018 (as far back as my electronic record ago) with added Adderall in the afternoon, then changed to Adderall only sometime toward the end of 2021.  I may consider adding low-dose Adderall back in but for now prefer not to for several reasons.  She is needing the Xanax more often than she had been and taking a stimulant would cancel out the effects of the Xanax, and possibly even increase the anxiety. We discussed Strattera, Deboraha Sprang, Intuniv, and Kapvay.  I would recommend  adding Strattera at a lower dose to start with.  This can help with depression and possibly anxiety as well.  Benefits, risks and side effects were discussed and she would like to try it. Her Paxil was decreased when she was hospitalized in September and then I had decreased even further in November.  She was getting more manic again so the Paxil was decreased and the Seroquel was increased.  Those measures did help to prevent a full-blown manic episode and possibly kept her out of the hospital.  I will consider increasing the Paxil up to 15 mg if the OCD gets worse, but she understands that may bring on a manic episode and we would have to watch her closely. States the pharmacy messed up with the Seroquel dosing and gave her 300 mg when I had wanted her to take the 400 mg to equal 1000 mg pills.  She did not get the 400 mg so she has been using the 300 mg, 3.5 pills to equal 1050 mg.  States she has felt better emotionally on that dose and would  like to continue it.  She does understand that is higher than FDA recommended dose but it is necessary in some patients.  Continue Xanax 1 mg, 1/2-1 3 times daily as needed anxiety or agitation.  Take the lowest effective amount. Start Strattera 25 mg, 1 p.o. every morning.  She understands this is a low dose and we may need to increase it. Continue Lamictal 200 mg, 1 p.o. daily. Continue Paxil 10 mg, 1 qd.  (May need to increase to 15 mg if OCD worsens before next appointment.) Continue Seroquel 300 mg, 3.5 pills nightly.  Continue trazodone 100 mg, 1-2 nightly as needed sleep.  She rarely needs it now. Continue multivitamin, B12, and vitamin D. Continue therapy. Return in 4 to 6 weeks.  Donnal Moat, PA-C

## 2021-07-11 ENCOUNTER — Telehealth: Payer: Self-pay

## 2021-07-11 DIAGNOSIS — M9902 Segmental and somatic dysfunction of thoracic region: Secondary | ICD-10-CM | POA: Diagnosis not present

## 2021-07-11 DIAGNOSIS — M546 Pain in thoracic spine: Secondary | ICD-10-CM | POA: Diagnosis not present

## 2021-07-11 DIAGNOSIS — M542 Cervicalgia: Secondary | ICD-10-CM | POA: Diagnosis not present

## 2021-07-11 DIAGNOSIS — M9901 Segmental and somatic dysfunction of cervical region: Secondary | ICD-10-CM | POA: Diagnosis not present

## 2021-07-11 NOTE — Telephone Encounter (Signed)
Prior Authorization submitted and approved for QUETIAPINE 300 MG #105 approved effective 06/11/2021-07/11/2022 with [facility] PA# [id number]

## 2021-07-19 ENCOUNTER — Other Ambulatory Visit: Payer: Self-pay | Admitting: Physician Assistant

## 2021-07-19 ENCOUNTER — Telehealth: Payer: Self-pay | Admitting: Physician Assistant

## 2021-07-19 MED ORDER — PAROXETINE HCL 20 MG PO TABS: 20.0000 mg | ORAL_TABLET | Freq: Every day | ORAL | 1 refills | Status: DC

## 2021-07-19 NOTE — Telephone Encounter (Signed)
LVM. Read TH last note and it said increase to 15 mg, not 20.

## 2021-07-19 NOTE — Telephone Encounter (Signed)
Yes, we talked about going up to 15 mg, not 20 mg. Since she is taking 20 mg, stay at that.  Do not increase.  She hasn't been on that dose long enough to see how effective it will be.  Last visit was 07/10/2021 when we discussed her going up to 15 mg, therefore she has only been at this higher dose for a week or a little over.  I will send in a prescription for 20 mg.

## 2021-07-19 NOTE — Telephone Encounter (Signed)
Patient lm stating she increased her Paxil from 10 to 20 per a conversation with Rosey Bath. She stated it is helping, but in her opinion she would like to take at least 30. Please advise on that recommendation, but if not send a updated Rx for 20 to the [facility] listed on file. Patient was last seen in the office on 1/11 with a follow up scheduled for 2/15.

## 2021-07-19 NOTE — Telephone Encounter (Signed)
See phone message from patient.  I called patient back and she is taking 20 mg, but feels she could even go up to 30 mg. She said the tablets are too tiny and they are hard and she can't split them. I told her she could get a pill splitter, but she really just wants to increase her dose. She said it was helping with her swallowing issues and her germaphobia some. I asked that she not increase to 30 mg without getting your approval to do so and she said she wouldn't.

## 2021-07-23 DIAGNOSIS — R944 Abnormal results of kidney function studies: Secondary | ICD-10-CM | POA: Diagnosis not present

## 2021-07-23 DIAGNOSIS — Z87440 Personal history of urinary (tract) infections: Secondary | ICD-10-CM | POA: Diagnosis not present

## 2021-08-07 DIAGNOSIS — D1801 Hemangioma of skin and subcutaneous tissue: Secondary | ICD-10-CM | POA: Diagnosis not present

## 2021-08-07 DIAGNOSIS — L814 Other melanin hyperpigmentation: Secondary | ICD-10-CM | POA: Diagnosis not present

## 2021-08-07 DIAGNOSIS — L578 Other skin changes due to chronic exposure to nonionizing radiation: Secondary | ICD-10-CM | POA: Diagnosis not present

## 2021-08-07 DIAGNOSIS — L821 Other seborrheic keratosis: Secondary | ICD-10-CM | POA: Diagnosis not present

## 2021-08-14 ENCOUNTER — Ambulatory Visit (INDEPENDENT_AMBULATORY_CARE_PROVIDER_SITE_OTHER): Payer: BC Managed Care – PPO | Admitting: Physician Assistant

## 2021-08-14 ENCOUNTER — Encounter: Payer: Self-pay | Admitting: Physician Assistant

## 2021-08-14 ENCOUNTER — Other Ambulatory Visit: Payer: Self-pay

## 2021-08-14 DIAGNOSIS — F3110 Bipolar disorder, current episode manic without psychotic features, unspecified: Secondary | ICD-10-CM

## 2021-08-14 DIAGNOSIS — G47419 Narcolepsy without cataplexy: Secondary | ICD-10-CM | POA: Diagnosis not present

## 2021-08-14 DIAGNOSIS — F900 Attention-deficit hyperactivity disorder, predominantly inattentive type: Secondary | ICD-10-CM

## 2021-08-14 DIAGNOSIS — F429 Obsessive-compulsive disorder, unspecified: Secondary | ICD-10-CM

## 2021-08-14 MED ORDER — PAROXETINE HCL 40 MG PO TABS: 40.0000 mg | ORAL_TABLET | ORAL | 3 refills | Status: DC

## 2021-08-14 NOTE — Progress Notes (Signed)
[facility]  Patient ID: Lindsay [name],  MRN: [id number]  PCP: Aliene Beams, MD  Date of Evaluation: 08/14/2021 Time spent:30 minutes  Chief Complaint:  Chief Complaint   Anxiety; Depression; Insomnia; ADHD; Follow-up     HISTORY/CURRENT STATUS: HPI For routine med check.  We added Strattera, she couldn't tolerate it, 'made my blood sugar drop. To the point I couldn't even get up to fix something to eat or get a drink.  My husband had to do it."  She did not check her blood sugars but she knows how it feels to have hypoglycemia so figured that is what it was.  Did not feel like the Strattera was helping her focus anymore either.  She increased the Paxil since her last visit.  Prior to the last hospitalization she was on 45 mg and it was thought that caused the manic episode, even though she had been on that for years without mania.  She was at 20 mg but since our last visit about 6 weeks ago she became much more obsessed and had compulsions like in the past.  It affected her day-to-day life and she could not do things she wanted to do.  It took a lot of time out of her day to check things over and over and she was obsessing about things so much she could not focus on anything else.  Since she increased the Paxil to 40 mg she has been a lot better.  The obsessions are not completely gone but have so much improved.  Patient denies loss of interest in usual activities and is able to enjoy things.  Denies decreased energy or motivation.  Appetite has not changed.  No extreme sadness, tearfulness, or feelings of hopelessness.  Anxiety is still a problem and she needs the Xanax although not routinely.  It is still effective.  She is not having panic attacks but more so a generalized sense of unease.  Still has trouble focusing and even staying awake some days but it is not bad like that has been in the past.  Has known narcolepsy.  She was on Adderall prior to the last hospitalization.   It was discontinued due to possibly causing mania.  She had been on it for a long time just that she had the Paxil.  She is sleeping well most of the time.  Denies suicidal or homicidal thoughts.  Patient denies increased energy with decreased need for sleep, no increased talkativeness, no racing thoughts, no impulsivity or risky behaviors, no increased spending, no increased libido, no grandiosity, no increased irritability or anger, no paranoia.  And no hallucinations.  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  She has been thinking about it for a while.  She has even been studying for the test she has to take and pass before she can become a flight attendant.  She is now concerned that it may not be the best thing for her mental health.  Denies dizziness, syncope, seizures, numbness, tingling, tremor, tics, unsteady gait, slurred speech, confusion. Denies muscle or joint pain, stiffness, or dystonia.  Individual Medical History/ Review of Systems: Changes? :No     Past medications for mental health diagnoses include: Geodon, Lamictal, Latuda, Paxil, trazodone, Ambien, Seroquel, Vraylar, Risperdal, Saphris, Depakote, Fanapt, Abilify, Rexulti, Wellbutrin, Provigil, Nuvigil, Vyvanse, Adderall (stopped when she was hospitalized for psychosis 02/2021) Strattera reportedly made her blood pressure drop.  ECT  Allergies: Strattera [atomoxetine]  Current Medications:  Current Outpatient  Medications:    ALPRAZolam (XANAX) 1 MG tablet, TAKE 1/2 TO 1 TABLET(0.5 TO 1 MG) BY MOUTH THREE TIMES DAILY AS NEEDED FOR ANXIETY, Disp: 45 tablet, Rfl: 5   Cholecalciferol 125 MCG (5000 UT) capsule, Take 5,000 Units by mouth daily., Disp: , Rfl:    estradiol (ESTRACE) 0.5 MG tablet, Take 0.5 tablets by mouth daily., Disp: , Rfl:    lamoTRIgine (LAMICTAL) 200 MG tablet, TAKE 1 TABLET(200 MG) BY MOUTH DAILY (Patient taking differently: Take 200 mg by mouth daily.), Disp: 90 tablet, Rfl: 3    levothyroxine (SYNTHROID) 137 MCG tablet, Take 137 mcg by mouth daily before breakfast., Disp: , Rfl:    liothyronine (CYTOMEL) 5 MCG tablet, Take 5 mcg by mouth daily., Disp: , Rfl:    PARoxetine (PAXIL) 40 MG tablet, Take 1 tablet (40 mg total) by mouth every morning., Disp: 90 tablet, Rfl: 3   progesterone (PROMETRIUM) 200 MG capsule, Take 200 mg by mouth daily., Disp: , Rfl:    QUEtiapine (SEROQUEL) 300 MG tablet, Take 3.5 tablets (1,050 mg total) by mouth at bedtime., Disp: 105 tablet, Rfl: 1   simvastatin (ZOCOR) 20 MG tablet, Take 20 mg by mouth at bedtime., Disp: , Rfl:    traZODone (DESYREL) 100 MG tablet, Take 1-2 tablets (100-200 mg total) by mouth at bedtime as needed for sleep., Disp: 60 tablet, Rfl: 1   vitamin B-12 (CYANOCOBALAMIN) 1000 MCG tablet, Take 1,000 mcg by mouth daily., Disp: , Rfl:  Medication Side Effects: none  Family Medical/ Social History: Changes?  No   MENTAL HEALTH EXAM:  There were no vitals taken for this visit.There is no height or weight on file to calculate BMI.  General Appearance: Casual and Well Groomed  Eye Contact:  Good  Speech:  Clear and Coherent, Normal Rate, and talkative but this is normal for her.  Volume:  Normal  Mood:  Euthymic  Affect:  Congruent  Thought Process:  Goal Directed and Descriptions of Associations: Circumstantial  Orientation:  Full (Time, Place, and Person)  Thought Content: Logical and ruminating about whether to become a flight attendant or not.    Suicidal Thoughts:  No  Homicidal Thoughts:  No  Memory:  WNL  Judgement:  Good  Insight:  Good  Psychomotor Activity:  Normal  Concentration:  Concentration: Fair and Attention Span: Fair  Recall:  [employer] of Knowledge: Good  Language: Good  Assets:  Desire for Improvement  ADL's:  Intact  Cognition: WNL  Prognosis:  Good    DIAGNOSES:    ICD-10-CM   1. 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 Bipolar I disorder, most  recent episode (or current) manic ([facility])  F31.10     3. Attention deficit hyperactivity disorder (ADHD), predominantly inattentive type  F90.0     4. Primary narcolepsy without cataplexy  G47.419         Receiving Psychotherapy: Yes   Hal Neer   RECOMMENDATIONS:  PDMP was reviewed.  Last Xanax filled 07/04/2021, last Adderall filled 02/24/2021. I provided 30 minutes of face to face time during this encounter, including time spent before and after the visit in records review, medical decision making, counseling pertinent to today's visit, and charting.  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  I do not recommend it.  The changing of time zones, lack of sleep, stress of dealing with so many other people, the OCD requiring perfection, claustrophobia in a closed up airplane, there are multiple  reasons, but I do not think it is a good idea.  She understands and agrees.  She feels better that I have told her not to pursue this. As far as her mental health is concerned at the present time, she is doing well on current medications.  I again advised her not to change medication doses without discussing with me.  However increasing the Paxil to this dose is fine, she knows to watch for any manic symptoms and notify me if they start to occur.  Continue Xanax 1 mg, 1/2-1 3 times daily as needed anxiety or agitation.  Take the lowest effective amount. Continue Lamictal 200 mg, 1 p.o. daily. Continue Paxil 40 mg, 1 p.o. daily.   Continue Seroquel 300 mg, 3.5 pills nightly.  Continue trazodone 100 mg, 1-2 nightly as needed sleep.  She rarely needs it now. Continue multivitamin, B12, and vitamin D. Continue therapy. Return in 3 months.  [name], PA-C

## 2021-08-21 ENCOUNTER — Telehealth: Payer: Self-pay | Admitting: Physician Assistant

## 2021-08-21 NOTE — Telephone Encounter (Signed)
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 She is trying to get her money back. She will pick up the letter. Please call [place] at [phone]

## 2021-08-22 NOTE — Telephone Encounter (Signed)
Earlier I asked and she said to just put "to whom it may concern" and yes she is going to pick it up

## 2021-08-22 NOTE — Telephone Encounter (Signed)
Yes, I'll write a letter. Who should I put the attention to? Is she going to pick it up? Thanks.

## 2021-08-22 NOTE — Telephone Encounter (Signed)
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

## 2021-08-23 NOTE — Telephone Encounter (Signed)
I just dictated the letter but forgot to tell you all that she wants to pick it up.  Thanks.

## 2021-08-26 DIAGNOSIS — Z1231 Encounter for screening mammogram for malignant neoplasm of breast: Secondary | ICD-10-CM | POA: Diagnosis not present

## 2021-08-27 ENCOUNTER — Telehealth: Payer: Self-pay | Admitting: Physician Assistant

## 2021-08-27 DIAGNOSIS — F3341 Major depressive disorder, recurrent, in partial remission: Secondary | ICD-10-CM | POA: Diagnosis not present

## 2021-08-27 NOTE — Telephone Encounter (Signed)
LVM advising letter completed from [employer] to pick up at front desk.

## 2021-09-07 ENCOUNTER — Other Ambulatory Visit: Payer: Self-pay | Admitting: Physician Assistant

## 2021-09-09 DIAGNOSIS — F3181 Bipolar II disorder: Secondary | ICD-10-CM | POA: Diagnosis not present

## 2021-09-24 DIAGNOSIS — F3181 Bipolar II disorder: Secondary | ICD-10-CM | POA: Diagnosis not present

## 2021-10-23 DIAGNOSIS — F3181 Bipolar II disorder: Secondary | ICD-10-CM | POA: Diagnosis not present

## 2021-11-04 DIAGNOSIS — F3181 Bipolar II disorder: Secondary | ICD-10-CM | POA: Diagnosis not present

## 2021-11-05 ENCOUNTER — Other Ambulatory Visit: Payer: Self-pay | Admitting: Physician Assistant

## 2021-11-05 NOTE — Telephone Encounter (Signed)
Pt called to [facility] for June and to report she has   job training  for 17 days in [place]. Leaves 5/14-5/31. All other meds ok. Just need Seroquel. Apt 6/12 ?

## 2021-11-12 ENCOUNTER — Ambulatory Visit: Payer: BC Managed Care – PPO | Admitting: Physician Assistant

## 2021-11-22 IMAGING — CR DG FINGER MIDDLE 2+V*L*
3 series · 3 of 3 positions shown · non-contrast
Comparison: None.

CLINICAL DATA: Left middle finger laceration.

EXAM:
LEFT MIDDLE FINGER 2+V

[x finger pa left]
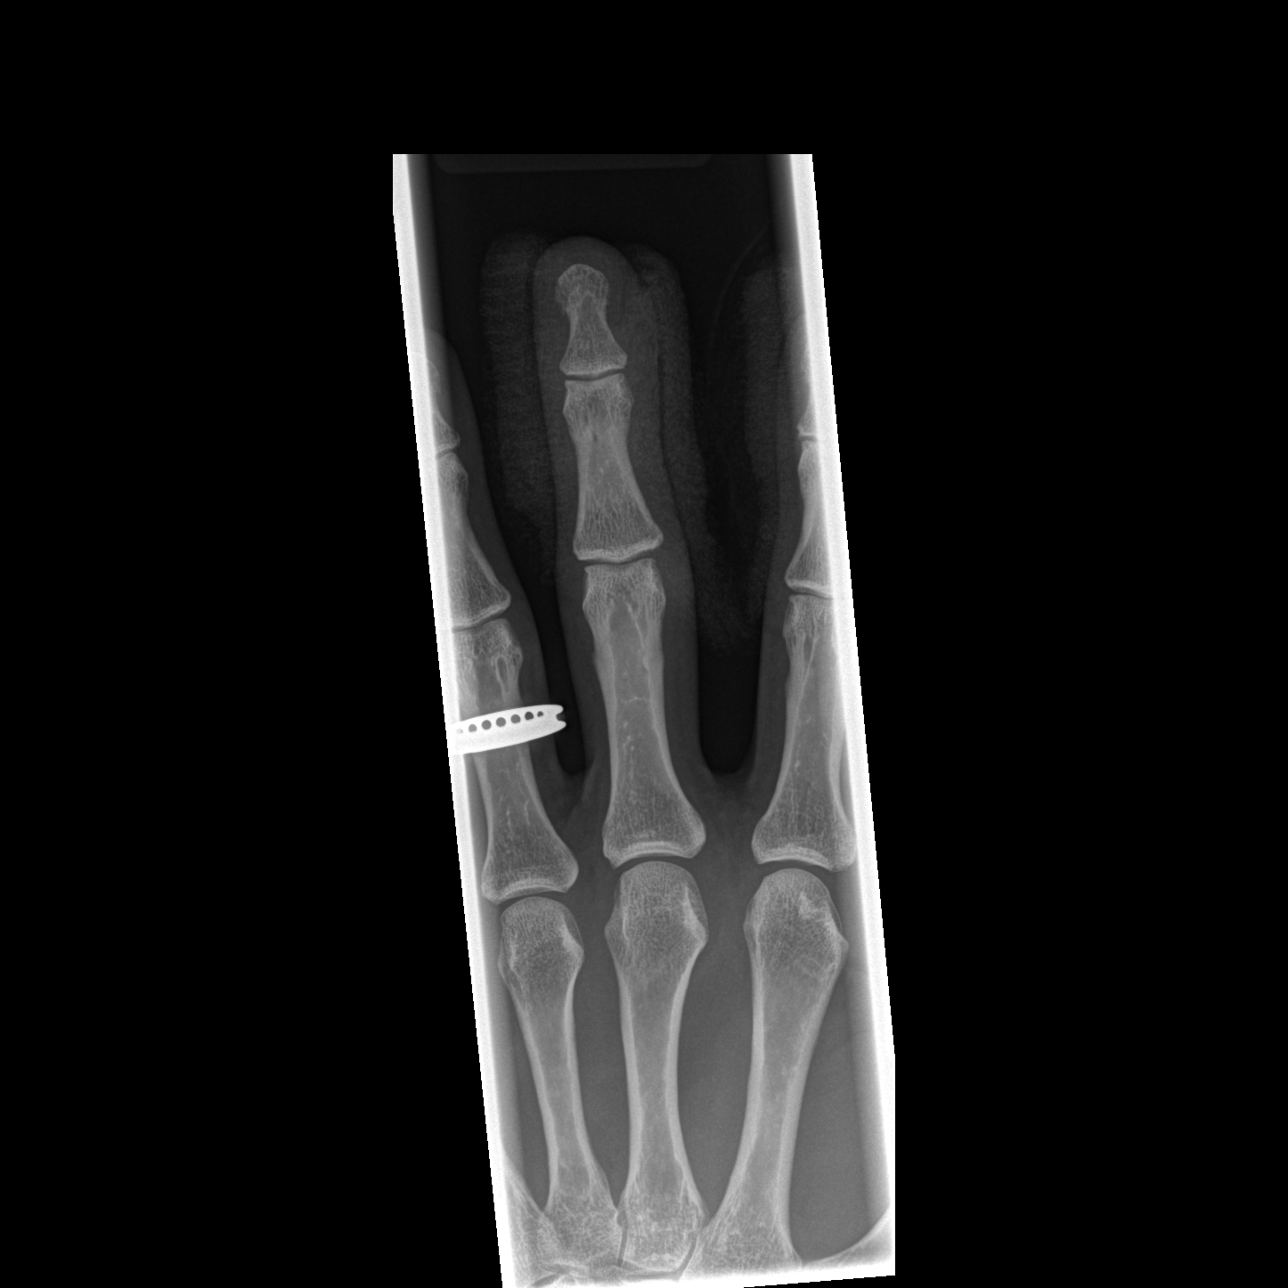

[x finger obl. left]
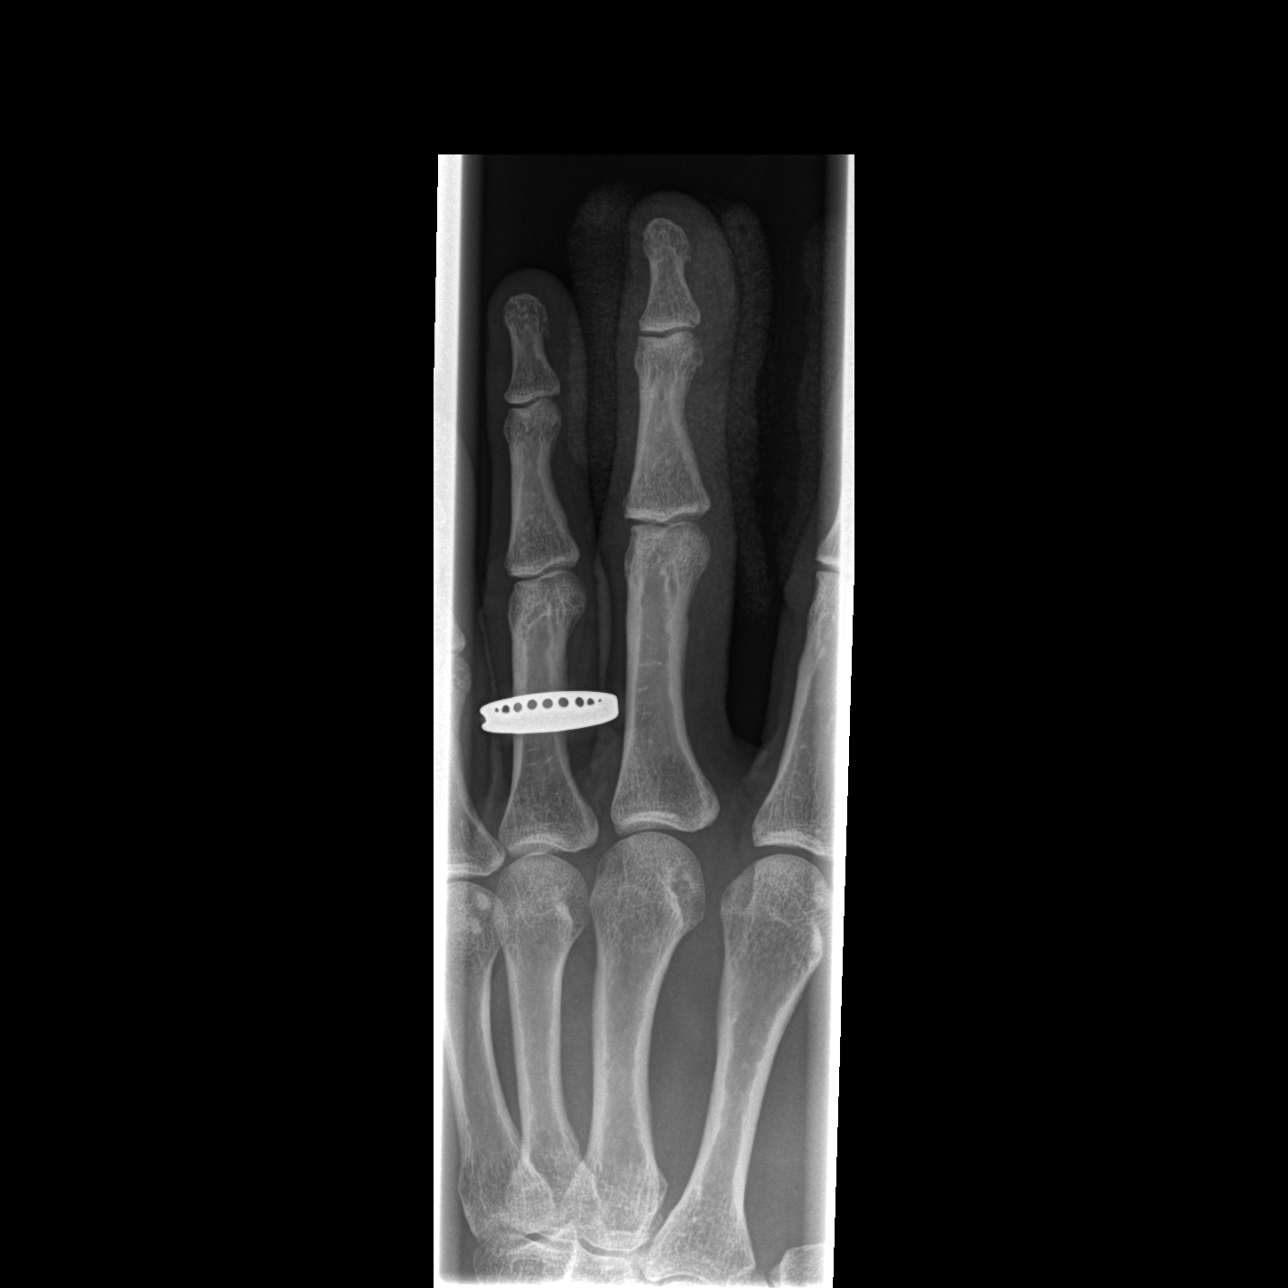

[x finger lateral left]
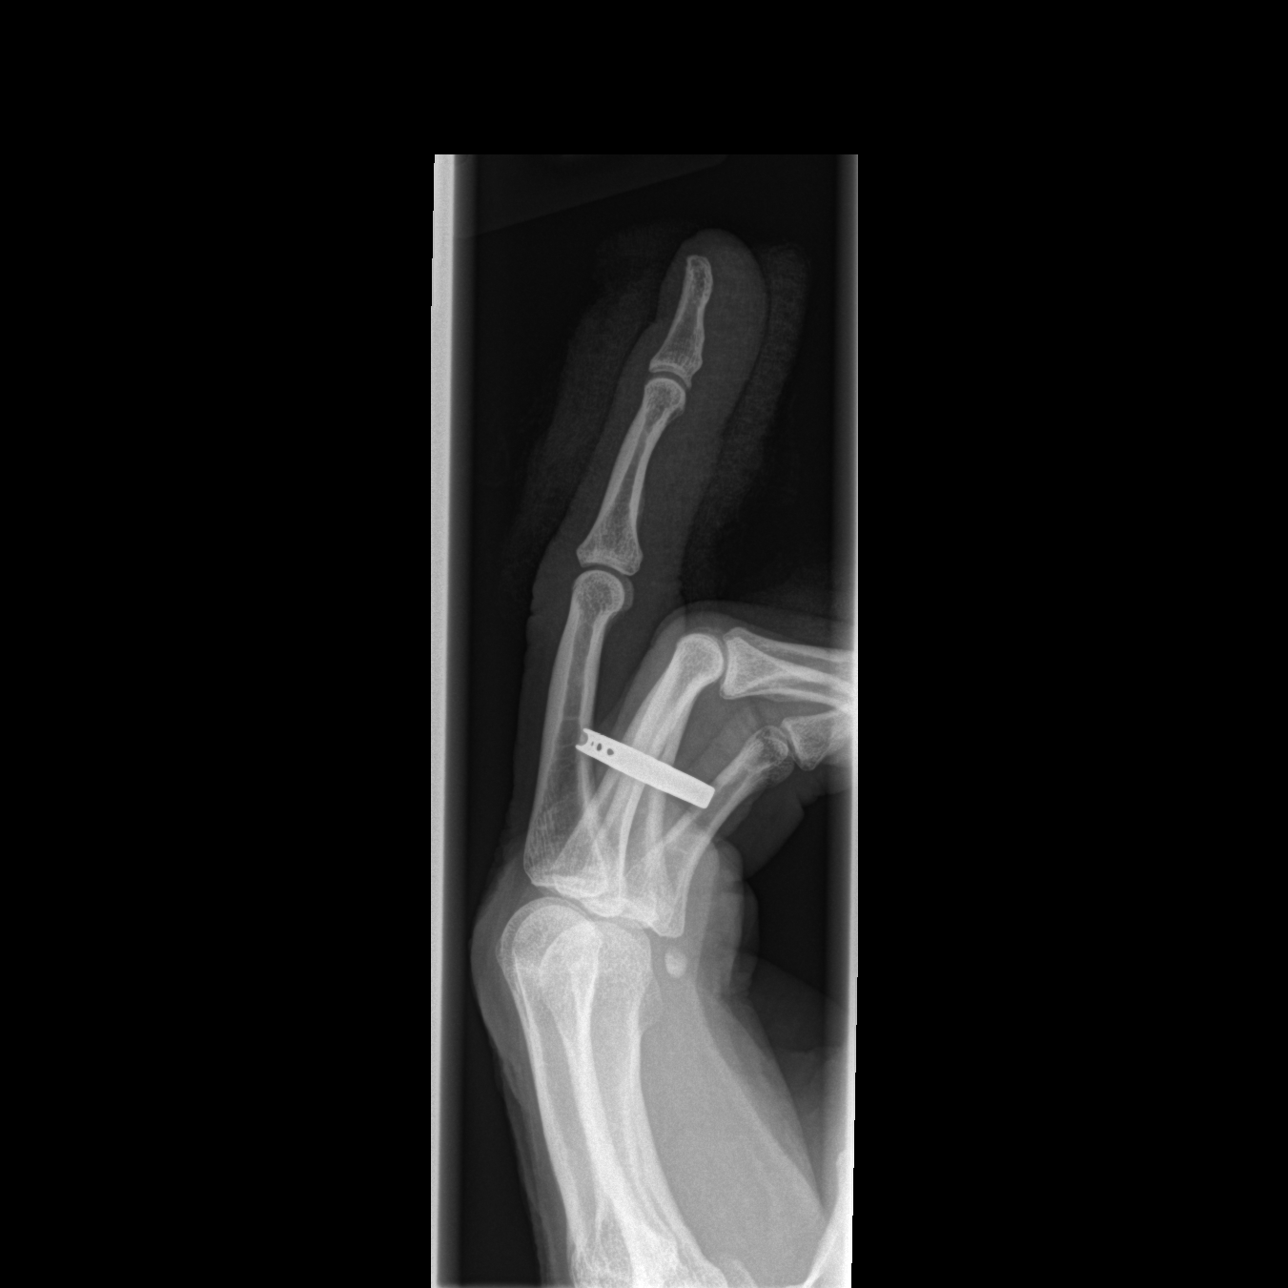

[3 of 3 positions shown; findings below may reference images not displayed]

FINDINGS: There is no evidence of fracture or dislocation. There is no
evidence of arthropathy or other focal bone abnormality. Soft
tissues are unremarkable.
IMPRESSION: Negative.

## 2021-12-03 DIAGNOSIS — F3181 Bipolar II disorder: Secondary | ICD-10-CM | POA: Diagnosis not present

## 2021-12-06 ENCOUNTER — Other Ambulatory Visit: Payer: Self-pay | Admitting: Physician Assistant

## 2021-12-06 NOTE — Telephone Encounter (Signed)
Last filled 5/5 appt 6/12

## 2021-12-09 ENCOUNTER — Encounter: Payer: Self-pay | Admitting: Physician Assistant

## 2021-12-09 ENCOUNTER — Ambulatory Visit: Payer: BC Managed Care – PPO | Admitting: Physician Assistant

## 2021-12-09 DIAGNOSIS — F411 Generalized anxiety disorder: Secondary | ICD-10-CM

## 2021-12-09 DIAGNOSIS — F319 Bipolar disorder, unspecified: Secondary | ICD-10-CM

## 2021-12-09 DIAGNOSIS — F429 Obsessive-compulsive disorder, unspecified: Secondary | ICD-10-CM | POA: Diagnosis not present

## 2021-12-09 MED ORDER — QUETIAPINE FUMARATE 300 MG PO TABS: ORAL_TABLET | ORAL | 5 refills | Status: DC

## 2021-12-09 NOTE — Progress Notes (Signed)
[facility]  Patient ID: NEOMI LAIDLER,  MRN: [id number]  PCP: Aliene Beams, MD  Date of Evaluation: 12/09/2021 Time spent:30 minutes  Chief Complaint:  Chief Complaint   Anxiety; Depression; Follow-up     HISTORY/CURRENT STATUS: HPI For routine med check.  Doing well. Meds are working. Sleeping well, she does take the Seroquel every night along with 1 mg of Xanax.  The anxiety is a lot better than it had been but sometimes she feels uptight so feels it is best to go ahead and take the Xanax.  Patient denies loss of interest in usual activities and is able to enjoy things.  Denies decreased energy.  Denies decreased motivation.  Unable to work outside the home.  She works out every day.  She has been more hungry since being on the Seroquel and does not want to gain weight.  ADLs and personal hygiene are normal.  No extreme sadness, tearfulness, or feelings of hopelessness.  Denies any changes in concentration, making decisions or remembering things.  Denies suicidal or homicidal thoughts.  Patient denies increased energy with decreased need for sleep, no increased talkativeness, no racing thoughts, no impulsivity or risky behaviors, no increased spending, no increased libido, no grandiosity, no increased irritability or anger, no paranoia, and no hallucinations.  Denies dizziness, syncope, seizures, numbness, tingling, tremor, tics, unsteady gait, slurred speech, confusion. Denies muscle or joint pain, stiffness, or dystonia. Denies unexplained weight loss, frequent infections, or sores that heal slowly.  No polydipsia, or polyuria. Denies visual changes or paresthesias.    Individual Medical History/ Review of Systems: Changes? :No     Past medications for mental health diagnoses include: Geodon, Lamictal, Latuda, Paxil, trazodone, Ambien, Seroquel, Vraylar, Risperdal, Saphris, Depakote, Fanapt, Abilify, Rexulti, Wellbutrin, Provigil, Nuvigil, Vyvanse, Adderall (stopped  when she was hospitalized for psychosis 02/2021) Strattera reportedly made her blood pressure drop.  Hospitalization 02/2021-for psychosis  ECT  Allergies: Strattera [atomoxetine]  Current Medications:  Current Outpatient Medications:    ALPRAZolam (XANAX) 1 MG tablet, TAKE 1/2 TO 1 TABLET(0.5 TO 1 MG) BY MOUTH THREE TIMES DAILY AS NEEDED FOR ANXIETY, Disp: 45 tablet, Rfl: 0   Cholecalciferol 125 MCG (5000 UT) capsule, Take 5,000 Units by mouth daily., Disp: , Rfl:    estradiol (ESTRACE) 0.5 MG tablet, Take 0.5 tablets by mouth daily., Disp: , Rfl:    lamoTRIgine (LAMICTAL) 200 MG tablet, TAKE 1 TABLET(200 MG) BY MOUTH DAILY (Patient taking differently: Take 200 mg by mouth daily.), Disp: 90 tablet, Rfl: 3   levothyroxine (SYNTHROID) 137 MCG tablet, Take 137 mcg by mouth daily before breakfast., Disp: , Rfl:    liothyronine (CYTOMEL) 5 MCG tablet, Take 5 mcg by mouth daily., Disp: , Rfl:    PARoxetine (PAXIL) 40 MG tablet, Take 1 tablet (40 mg total) by mouth every morning., Disp: 90 tablet, Rfl: 3   progesterone (PROMETRIUM) 200 MG capsule, Take 200 mg by mouth daily., Disp: , Rfl:    simvastatin (ZOCOR) 20 MG tablet, Take 20 mg by mouth at bedtime., Disp: , Rfl:    vitamin B-12 (CYANOCOBALAMIN) 1000 MCG tablet, Take 1,000 mcg by mouth daily., Disp: , Rfl:    QUEtiapine (SEROQUEL) 300 MG tablet, TAKE 3 AND 1/2 TABLETS(1050 MG) BY MOUTH AT BEDTIME, Disp: 105 tablet, Rfl: 5 Medication Side Effects: none  Family Medical/ Social History: Changes?  No  MENTAL HEALTH EXAM:  There were no vitals taken for this visit.There is no height or weight on file to calculate BMI.  General Appearance: Casual and Well Groomed  Eye Contact:  Good  Speech:  Clear and Coherent and Normal Rate  Volume:  Normal  Mood:  Euthymic  Affect:  Congruent  Thought Process:  Goal Directed and Descriptions of Associations: Circumstantial  Orientation:  Full (Time, Place, and Person)  Thought Content: Logical    Suicidal Thoughts:  No  Homicidal Thoughts:  No  Memory:  WNL  Judgement:  Good  Insight:  Good  Psychomotor Activity:  Normal  Concentration:  Concentration: Fair and Attention Span: Fair  Recall:  [employer] of Knowledge: Good  Language: Good  Assets:  Desire for Improvement  ADL's:  Intact  Cognition: WNL  Prognosis:  Good    DIAGNOSES:    ICD-10-CM   1. Bipolar I disorder (HCC)  F31.9     2. 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 Generalized anxiety disorder  F41.1       Receiving Psychotherapy: Yes   Hal Neer   RECOMMENDATIONS:  PDMP was reviewed.  Last Xanax filled 12/07/2021.  Last Adderall filled 01/29/2021. I provided 20 minutes of face to face time during this encounter, including time spent before and after the visit in records review, medical decision making, counseling pertinent to today's visit, and charting.  Nare is doing well overall so no changes will be made. If she does not need the Xanax she does not have to take it.  I do not want her to stop it cold [place] though, she can take 1/2 pill in the evening if she needs it.  She is not agitated now also it may not be necessary.  In the Seroquel is helping her go to sleep.  Continue Xanax 1 mg, 1/2-1 3 times daily as needed anxiety or agitation.  Take the lowest effective amount. Continue Lamictal 200 mg, 1 p.o. daily. Continue Paxil 40 mg, 1 p.o. daily.   Continue Seroquel 300 mg, 3.5 pills nightly.  Continue multivitamin, B12, and vitamin D. Continue therapy. Return in 3 months.  [name], PA-C

## 2021-12-27 ENCOUNTER — Other Ambulatory Visit: Payer: Self-pay | Admitting: Physician Assistant

## 2022-01-17 DIAGNOSIS — F3181 Bipolar II disorder: Secondary | ICD-10-CM | POA: Diagnosis not present

## 2022-01-21 ENCOUNTER — Other Ambulatory Visit: Payer: Self-pay | Admitting: Physician Assistant

## 2022-02-19 ENCOUNTER — Telehealth: Payer: Self-pay | Admitting: Physician Assistant

## 2022-02-19 NOTE — Telephone Encounter (Signed)
Please clarify if it was her blood sugar or blood pressure.  And did she take readings, not just thought the value was low because of the way she felt.  There is a newer drug called Lindsay [name] for ADHD, that is also a nonstimulant.  It is in the same family of drugs as Strattera but it does not mean it would cause those same side effects.  Let me know if she would like me to send that in.  Or if she wants to retry the Strattera we could do that but at a very low dose and increase if needed.

## 2022-02-19 NOTE — Telephone Encounter (Signed)
Next visit is 03/11/22. Lindsay [name] called and said that when she took the Strattera her blood sugar went low. She was on it for about a month. When she quit taking it, her blood sugar was normal and didn't go low. She would like to be put on something else. Her phone number is [phone]. Pharmacy is:  [id number] [place], [place] - [address] AT [facility] [address] [facility] [address]  Phone:  [phone]  Fax:  [phone]

## 2022-02-19 NOTE — Telephone Encounter (Signed)
Please review

## 2022-02-20 ENCOUNTER — Other Ambulatory Visit: Payer: Self-pay | Admitting: Physician Assistant

## 2022-02-20 MED ORDER — VILOXAZINE HCL ER 200 MG PO CP24: 200.0000 mg | ORAL_CAPSULE | Freq: Every day | ORAL | 1 refills | Status: DC

## 2022-02-20 NOTE — Telephone Encounter (Signed)
Pt stated she did mean her blood sugar and she did not check it but she said she gets hypoglycemia.I think it's more so a feeling for her because she did not check her sugar to know if it was low.She is interested in quelbree and wants it sent to the [facility] on [address].

## 2022-02-20 NOTE — Telephone Encounter (Signed)
Sent in the prescription.  Please let her know let her know that I have samples for her, there is a co-pay card included in the sample box.  She will need that when she goes to the pharmacy.  I have started her at a very low dose and will increase if she tolerates it.  Thank you.

## 2022-02-20 NOTE — Telephone Encounter (Signed)
Pt informed

## 2022-03-11 ENCOUNTER — Ambulatory Visit: Payer: BC Managed Care – PPO | Admitting: Physician Assistant

## 2022-03-11 ENCOUNTER — Encounter: Payer: Self-pay | Admitting: Physician Assistant

## 2022-03-11 DIAGNOSIS — F429 Obsessive-compulsive disorder, unspecified: Secondary | ICD-10-CM | POA: Diagnosis not present

## 2022-03-11 DIAGNOSIS — G4700 Insomnia, unspecified: Secondary | ICD-10-CM

## 2022-03-11 DIAGNOSIS — F319 Bipolar disorder, unspecified: Secondary | ICD-10-CM | POA: Diagnosis not present

## 2022-03-11 DIAGNOSIS — F411 Generalized anxiety disorder: Secondary | ICD-10-CM | POA: Diagnosis not present

## 2022-03-11 DIAGNOSIS — F900 Attention-deficit hyperactivity disorder, predominantly inattentive type: Secondary | ICD-10-CM | POA: Diagnosis not present

## 2022-03-11 DIAGNOSIS — G47419 Narcolepsy without cataplexy: Secondary | ICD-10-CM

## 2022-03-11 MED ORDER — AMPHETAMINE-DEXTROAMPHETAMINE 10 MG PO TABS: ORAL_TABLET | ORAL | 0 refills | Status: DC

## 2022-03-11 NOTE — Progress Notes (Signed)
[facility]  Patient ID: Lindsay [name],  MRN: [id number]  PCP: Aliene Beams, MD  Date of Evaluation: 03/11/2022 Time spent:30 minutes  Chief Complaint:  Chief Complaint   Anxiety; Depression; ADHD; Follow-up     HISTORY/CURRENT STATUS: HPI For routine med check.  Is impatient, bored, needs to be doing something all the time. But can't focus, gets distracted easily. Hard to finish something once she's started it. Took Adderall in the past which helped and caused no mania/psychosis for years. When she was psychotic and hospitalized in the fall of 2022, it was d/c. Took Strattera at some point but had low BP, unsure if caused by the med or not. Didn't change 'much' after d/c Strattera. Adderall helped w/ daytime drowsiness as well. Has narcolepsy.  Patient is able to enjoy things.  Energy and motivation are good. Wants to go back to work doing something.   No extreme sadness, tearfulness, or feelings of hopelessness.  Sleeps well most of the time. ADLs and personal hygiene are normal.   Appetite has not changed.  Weight is stable. Anxiety is controlled, has to take Xanax sometimes when she's really overwhelmed. Works well.  Denies suicidal or homicidal thoughts.  Patient denies increased energy with decreased need for sleep, increased talkativeness, racing thoughts, impulsivity or risky behaviors, increased spending, increased libido, grandiosity, increased irritability or anger, paranoia, or hallucinations.  Denies dizziness, syncope, seizures, numbness, tingling, tremor, tics, unsteady gait, slurred speech, confusion. Denies muscle or joint pain, stiffness, or dystonia. Denies unexplained weight loss, frequent infections, or sores that heal slowly.  No polydipsia, or polyuria. Denies visual changes or paresthesias.   Individual Medical History/ Review of Systems: Changes? :No     Past medications for mental health diagnoses include: Geodon, Lamictal, Latuda, Paxil,  trazodone, Ambien, Seroquel, Vraylar, Risperdal, Saphris, Depakote, Fanapt, Abilify, Rexulti, Wellbutrin, Provigil, Nuvigil, Vyvanse, Adderall (stopped when she was hospitalized for psychosis 02/2021) Strattera reportedly made her blood pressure drop.  Hospitalization 02/2021-for psychosis  ECT  Allergies: Strattera [atomoxetine]  Current Medications:  Current Outpatient Medications:    ALPRAZolam (XANAX) 1 MG tablet, TAKE 1/2 TO 1 TABLET(0.5 TO 1 MG) BY MOUTH THREE TIMES DAILY AS NEEDED FOR ANXIETY, Disp: 45 tablet, Rfl: 5   amphetamine-dextroamphetamine (ADDERALL) 10 MG tablet, 1/2 po at breakfast and 1 po lunch, Disp: 30 tablet, Rfl: 0   Cholecalciferol 125 MCG (5000 UT) capsule, Take 5,000 Units by mouth daily., Disp: , Rfl:    estradiol (ESTRACE) 0.5 MG tablet, Take 0.5 tablets by mouth daily., Disp: , Rfl:    lamoTRIgine (LAMICTAL) 200 MG tablet, TAKE 1 TABLET(200 MG) BY MOUTH DAILY (Patient taking differently: Take 200 mg by mouth daily.), Disp: 90 tablet, Rfl: 3   levothyroxine (SYNTHROID) 137 MCG tablet, Take 137 mcg by mouth daily before breakfast., Disp: , Rfl:    liothyronine (CYTOMEL) 5 MCG tablet, Take 5 mcg by mouth daily., Disp: , Rfl:    PARoxetine (PAXIL) 40 MG tablet, Take 1 tablet (40 mg total) by mouth every morning., Disp: 90 tablet, Rfl: 3   progesterone (PROMETRIUM) 200 MG capsule, Take 200 mg by mouth daily., Disp: , Rfl:    QUEtiapine (SEROQUEL) 300 MG tablet, TAKE 3 AND 1/2 TABLETS(1050 MG) BY MOUTH AT BEDTIME, Disp: 105 tablet, Rfl: 5   simvastatin (ZOCOR) 20 MG tablet, Take 20 mg by mouth at bedtime., Disp: , Rfl:    vitamin B-12 (CYANOCOBALAMIN) 1000 MCG tablet, Take 1,000 mcg by mouth daily., Disp: , Rfl:  viloxazine ER (QELBREE) 200 MG 24 hr capsule, Take 1 capsule (200 mg total) by mouth daily. (Patient not taking: Reported on 03/11/2022), Disp: 30 capsule, Rfl: 1 Medication Side Effects: none  Family Medical/ Social History: Changes?  Her sister died a few  months ago.   MENTAL HEALTH EXAM:  There were no vitals taken for this visit.There is no height or weight on file to calculate BMI.  General Appearance: Casual and Well Groomed  Eye Contact:  Good  Speech:  Clear and Coherent and Normal Rate  Volume:  Normal  Mood:  Euthymic  Affect:  Congruent  Thought Process:  Goal Directed and Descriptions of Associations: Circumstantial  Orientation:  Full (Time, Place, and Person)  Thought Content: Logical   Suicidal Thoughts:  No  Homicidal Thoughts:  No  Memory:  WNL  Judgement:  Good  Insight:  Good  Psychomotor Activity:  Normal  Concentration:  Concentration: Fair and Attention Span: Fair  Recall:  Fair  Fund of Knowledge: Good  Language: Good  Assets:  Desire for Improvement Financial Resources/Insurance Housing Transportation  ADL's:  Intact  Cognition: WNL  Prognosis:  Good   DIAGNOSES:    ICD-10-CM   1. Bipolar I disorder (HCC)  F31.9     2. Attention deficit hyperactivity disorder (ADHD), predominantly inattentive type  F90.0     3. 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 Generalized anxiety disorder  F41.1     5. Insomnia, unspecified type  G47.00     6. 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  Last Xanax filled 02/26/2022. I provided 30 minutes of face to face time during this encounter, including time spent before and after the visit in records review, medical decision making, counseling pertinent to today's visit, and charting.   My condolences in the loss of her sister.   Long discussion about going back on Adderall. She did well, without psychosis for several years when on it, until last fall. She wasn't on high enough dose of Seroquel at that time though so I don't believe the Adderall was a factor at all. I am fine with re-trying it, low dose. She would like to. Other option is  to re-try Starttera, monitor Bps closely.She would be willing to do that as well. Adderall will work best though, b/c narcolepsy.  Because she'll be on a stimulant now, she'll need to take the Xanax sparingly. Explained the upper/downer effect. She understands.  Continue Xanax 1 mg, 1/2-1 po 3 times daily as needed anxiety or agitation.  Take the lowest effective amount. Restart Adderall 10 mg, 1/2 at breakfast and 1/2 at lunch. (Mistake on Rx, says 1 at lunch) Continue Lamictal 200 mg, 1 p.o. daily. Continue Paxil 40 mg, 1 p.o. daily.   Continue Seroquel 300 mg, 3.5 pills nightly.  Continue multivitamin, B12, and vitamin D. Continue therapy. Return in 4 weeks.   [name], PA-C

## 2022-03-13 DIAGNOSIS — F3181 Bipolar II disorder: Secondary | ICD-10-CM | POA: Diagnosis not present

## 2022-03-18 ENCOUNTER — Telehealth: Payer: Self-pay | Admitting: Physician Assistant

## 2022-03-18 NOTE — Telephone Encounter (Signed)
Please review

## 2022-03-18 NOTE — Telephone Encounter (Signed)
Pt LVM @ 12:43p.  She said that [name] started her on 10mg  Adderall.  She was suppose to break the pill in half and take it 2 times a day.  She mistakenly took a whole pill the other day and would like to know if [name] will increase her to that dosage.  She said she felt like it helped her depression.  She didn't have any side effects and no mania and her mood is stable.  She called back to say another reason she would like to take the whole pill is because it's kinda hard to break it half, it just kind of crumbles.   Next appt 10/20

## 2022-03-18 NOTE — Telephone Encounter (Signed)
She can take 1 whole pill in the morning but only 1/day.  I do not want to increase the total dose just yet.

## 2022-03-19 NOTE — Telephone Encounter (Signed)
Pt informed

## 2022-03-27 ENCOUNTER — Other Ambulatory Visit: Payer: Self-pay | Admitting: Physician Assistant

## 2022-04-01 DIAGNOSIS — F3181 Bipolar II disorder: Secondary | ICD-10-CM | POA: Diagnosis not present

## 2022-04-03 ENCOUNTER — Telehealth: Payer: Self-pay | Admitting: Physician Assistant

## 2022-04-03 ENCOUNTER — Other Ambulatory Visit: Payer: Self-pay

## 2022-04-03 MED ORDER — AMPHETAMINE-DEXTROAMPHETAMINE 10 MG PO TABS: ORAL_TABLET | ORAL | 0 refills | Status: DC

## 2022-04-03 NOTE — Telephone Encounter (Signed)
Pended.

## 2022-04-03 NOTE — Telephone Encounter (Signed)
Pt called requesting a refill on her adderall 10 mg. Pharmacy is [facility] on [address]

## 2022-04-08 DIAGNOSIS — R7303 Prediabetes: Secondary | ICD-10-CM | POA: Diagnosis not present

## 2022-04-08 DIAGNOSIS — Z23 Encounter for immunization: Secondary | ICD-10-CM | POA: Diagnosis not present

## 2022-04-08 DIAGNOSIS — R635 Abnormal weight gain: Secondary | ICD-10-CM | POA: Diagnosis not present

## 2022-04-08 DIAGNOSIS — F39 Unspecified mood [affective] disorder: Secondary | ICD-10-CM | POA: Diagnosis not present

## 2022-04-08 DIAGNOSIS — E039 Hypothyroidism, unspecified: Secondary | ICD-10-CM | POA: Diagnosis not present

## 2022-04-15 DIAGNOSIS — D229 Melanocytic nevi, unspecified: Secondary | ICD-10-CM | POA: Diagnosis not present

## 2022-04-15 DIAGNOSIS — L821 Other seborrheic keratosis: Secondary | ICD-10-CM | POA: Diagnosis not present

## 2022-04-15 DIAGNOSIS — L578 Other skin changes due to chronic exposure to nonionizing radiation: Secondary | ICD-10-CM | POA: Diagnosis not present

## 2022-04-15 DIAGNOSIS — L814 Other melanin hyperpigmentation: Secondary | ICD-10-CM | POA: Diagnosis not present

## 2022-04-15 DIAGNOSIS — D485 Neoplasm of uncertain behavior of skin: Secondary | ICD-10-CM | POA: Diagnosis not present

## 2022-04-18 ENCOUNTER — Ambulatory Visit: Payer: BC Managed Care – PPO | Admitting: Physician Assistant

## 2022-04-18 ENCOUNTER — Encounter: Payer: Self-pay | Admitting: Physician Assistant

## 2022-04-18 DIAGNOSIS — F900 Attention-deficit hyperactivity disorder, predominantly inattentive type: Secondary | ICD-10-CM

## 2022-04-18 DIAGNOSIS — F411 Generalized anxiety disorder: Secondary | ICD-10-CM | POA: Diagnosis not present

## 2022-04-18 DIAGNOSIS — F429 Obsessive-compulsive disorder, unspecified: Secondary | ICD-10-CM

## 2022-04-18 DIAGNOSIS — F3181 Bipolar II disorder: Secondary | ICD-10-CM | POA: Diagnosis not present

## 2022-04-18 DIAGNOSIS — F319 Bipolar disorder, unspecified: Secondary | ICD-10-CM

## 2022-04-18 DIAGNOSIS — G47419 Narcolepsy without cataplexy: Secondary | ICD-10-CM

## 2022-04-18 DIAGNOSIS — G4700 Insomnia, unspecified: Secondary | ICD-10-CM

## 2022-04-18 MED ORDER — AMPHETAMINE-DEXTROAMPHETAMINE 10 MG PO TABS: 10.0000 mg | ORAL_TABLET | Freq: Two times a day (BID) | ORAL | 0 refills | Status: DC

## 2022-04-18 MED ORDER — QUETIAPINE FUMARATE 400 MG PO TABS: 800.0000 mg | ORAL_TABLET | Freq: Every day | ORAL | 1 refills | Status: DC

## 2022-04-18 NOTE — Progress Notes (Signed)
[facility]  Patient ID: Lindsay [name],  MRN: [id number]  PCP: Caren Macadam, MD  Date of Evaluation: 04/18/2022 Time spent:30 minutes  Chief Complaint:  Chief Complaint   Anxiety; ADHD; Depression; Follow-up    HISTORY/CURRENT STATUS: HPI For routine med check.  She increased the Adderall to 10 mg bid. It helps her mood, is happier, also more motivated, not having cravings like she was. She decreased the Seroquel to 700 mg. "I wanted to ask forgiveness instead of permission. I'm not manic."    Denies dizziness, syncope, seizures, numbness, tingling, tremor, tics, unsteady gait, slurred speech, confusion. Denies muscle or joint pain, stiffness, or dystonia. Denies unexplained weight loss, frequent infections, or sores that heal slowly.  No polydipsia, or polyuria. Denies visual changes or paresthesias.   Individual Medical History/ Review of Systems: Changes? :No     Past medications for mental health diagnoses include: Geodon, Lamictal, Latuda, Paxil, trazodone, Ambien, Seroquel, Vraylar, Risperdal, Saphris, Depakote, Fanapt, Abilify, Rexulti, Wellbutrin, Provigil, Nuvigil, Vyvanse, Adderall (stopped when she was hospitalized for psychosis 02/2021) Strattera reportedly made her blood pressure drop.  Hospitalization 02/2021-for psychosis  ECT  Allergies: Strattera [atomoxetine]  Current Medications:  Current Outpatient Medications:  .  ALPRAZolam (XANAX) 1 MG tablet, TAKE 1/2 TO 1 TABLET(0.5 TO 1 MG) BY MOUTH THREE TIMES DAILY AS NEEDED FOR ANXIETY, Disp: 45 tablet, Rfl: 5 .  amphetamine-dextroamphetamine (ADDERALL) 10 MG tablet, 1/2 po at breakfast and 1 po lunch (Patient taking differently: Take 10 mg by mouth 2 (two) times daily with a meal.), Disp: 30 tablet, Rfl: 0 .  Cholecalciferol 125 MCG (5000 UT) capsule, Take 5,000 Units by mouth daily., Disp: , Rfl:  .  estradiol (ESTRACE) 0.5 MG tablet, Take 0.5 tablets by mouth daily., Disp: , Rfl:  .  lamoTRIgine  (LAMICTAL) 200 MG tablet, TAKE 1 TABLET(200 MG) BY MOUTH DAILY, Disp: 90 tablet, Rfl: 3 .  levothyroxine (SYNTHROID) 137 MCG tablet, Take 137 mcg by mouth daily before breakfast., Disp: , Rfl:  .  liothyronine (CYTOMEL) 5 MCG tablet, Take 5 mcg by mouth daily., Disp: , Rfl:  .  PARoxetine (PAXIL) 40 MG tablet, Take 1 tablet (40 mg total) by mouth every morning., Disp: 90 tablet, Rfl: 3 .  progesterone (PROMETRIUM) 200 MG capsule, Take 200 mg by mouth daily., Disp: , Rfl:  .  QUEtiapine (SEROQUEL) 300 MG tablet, TAKE 3 AND 1/2 TABLETS(1050 MG) BY MOUTH AT BEDTIME, Disp: 105 tablet, Rfl: 5 .  simvastatin (ZOCOR) 20 MG tablet, Take 20 mg by mouth at bedtime., Disp: , Rfl:  .  vitamin B-12 (CYANOCOBALAMIN) 1000 MCG tablet, Take 1,000 mcg by mouth daily., Disp: , Rfl:  Medication Side Effects: none  Family Medical/ Social History: Changes? No  MENTAL HEALTH EXAM:  There were no vitals taken for this visit.There is no height or weight on file to calculate BMI.  General Appearance: Casual, Well Groomed, and Obese  Eye Contact:  Good  Speech:  Clear and Coherent and Normal Rate  Volume:  Normal  Mood:  Euthymic  Affect:  Congruent  Thought Process:  Goal Directed and Descriptions of Associations: Circumstantial  Orientation:  Full (Time, Place, and Person)  Thought Content: Logical   Suicidal Thoughts:  No  Homicidal Thoughts:  No  Memory:  WNL  Judgement:  Good  Insight:  Good  Psychomotor Activity:  Normal  Concentration:  Concentration: Fair and Attention Span: Fair  Recall:  [place] of Knowledge: Good  Language: Good  Assets:  Desire for Improvement Financial Resources/Insurance Housing Transportation  ADL's:  Intact  Cognition: WNL  Prognosis:  Good   DIAGNOSES:  No diagnosis found.  Receiving Psychotherapy: Yes   Hal Neer  RECOMMENDATIONS:  PDMP was reviewed.  Last Xanax filled 04/04/2022.  Adderall filled 04/03/2022.     Continue Xanax 1 mg, 1/2-1 po q hs  prn sleep.  Continue Adderall 10 mg, 1 po bid.  Continue Lamictal 200 mg, 1 p.o. daily. Continue Paxil 40 mg, 1 p.o. daily.   Increase Seroquel to 400 mg, 2 po qhs.  Continue multivitamin, B12, and vitamin D. Continue therapy. Return in 4 weeks.   [name], PA-C

## 2022-05-02 DIAGNOSIS — F3181 Bipolar II disorder: Secondary | ICD-10-CM | POA: Diagnosis not present

## 2022-05-13 DIAGNOSIS — E039 Hypothyroidism, unspecified: Secondary | ICD-10-CM | POA: Diagnosis not present

## 2022-05-15 DIAGNOSIS — E039 Hypothyroidism, unspecified: Secondary | ICD-10-CM | POA: Diagnosis not present

## 2022-05-15 DIAGNOSIS — M79671 Pain in right foot: Secondary | ICD-10-CM | POA: Diagnosis not present

## 2022-05-15 DIAGNOSIS — E049 Nontoxic goiter, unspecified: Secondary | ICD-10-CM | POA: Diagnosis not present

## 2022-05-16 DIAGNOSIS — F3181 Bipolar II disorder: Secondary | ICD-10-CM | POA: Diagnosis not present

## 2022-05-20 ENCOUNTER — Encounter: Payer: Self-pay | Admitting: Physician Assistant

## 2022-05-20 ENCOUNTER — Ambulatory Visit: Payer: BC Managed Care – PPO | Admitting: Physician Assistant

## 2022-05-20 DIAGNOSIS — F319 Bipolar disorder, unspecified: Secondary | ICD-10-CM | POA: Diagnosis not present

## 2022-05-20 DIAGNOSIS — F411 Generalized anxiety disorder: Secondary | ICD-10-CM | POA: Diagnosis not present

## 2022-05-20 DIAGNOSIS — R635 Abnormal weight gain: Secondary | ICD-10-CM

## 2022-05-20 DIAGNOSIS — F900 Attention-deficit hyperactivity disorder, predominantly inattentive type: Secondary | ICD-10-CM | POA: Diagnosis not present

## 2022-05-20 DIAGNOSIS — F429 Obsessive-compulsive disorder, unspecified: Secondary | ICD-10-CM

## 2022-05-20 MED ORDER — AMPHETAMINE-DEXTROAMPHETAMINE 10 MG PO TABS: 10.0000 mg | ORAL_TABLET | Freq: Two times a day (BID) | ORAL | 0 refills | Status: DC

## 2022-05-20 MED ORDER — QUETIAPINE FUMARATE 400 MG PO TABS: 800.0000 mg | ORAL_TABLET | Freq: Every day | ORAL | 1 refills | Status: DC

## 2022-05-20 NOTE — Progress Notes (Signed)
[facility]  Patient ID: Lindsay [name],  MRN: [id number]  PCP: Aliene Beams, MD  Date of Evaluation: 05/20/2022 Time spent:30 minutes  Chief Complaint:  Chief Complaint   ADHD; Depression; Insomnia; Follow-up    HISTORY/CURRENT STATUS: HPI For routine med check.  Improved mood since increasing the Seroquel. Has gained a few pounds and doesn't like that. Is always self-conscious about her wt. Had labs about a month ago with PCP and was pre-diabetic. Not treated though.   Patient is able to enjoy things.  Energy and motivation are much better w/ Adderall.  No extreme sadness, tearfulness, or feelings of hopelessness.  Sleeps well most of the time. ADLs and personal hygiene are normal.   Denies any changes in concentration, making decisions, or remembering things.   Increased appetite. Anxiety is controlled. Takes Xanax at night or else she can't sleep. Otilio Carpen takes during the day if gets really overwhelmed. Denies suicidal or homicidal thoughts.  Patient denies increased energy with decreased need for sleep, increased talkativeness, racing thoughts, impulsivity or risky behaviors, increased spending, increased libido, grandiosity, increased irritability or anger, paranoia, or hallucinations.  Denies dizziness, syncope, seizures, numbness, tingling, tremor, tics, unsteady gait, slurred speech, confusion. Denies muscle or joint pain, stiffness, or dystonia. Denies unexplained weight loss, frequent infections, or sores that heal slowly.  No polydipsia, or polyuria. Denies visual changes or paresthesias.   Individual Medical History/ Review of Systems: Changes? :No     Past medications for mental health diagnoses include: Geodon, Lamictal, Latuda, Paxil, trazodone, Ambien, Seroquel, Vraylar, Risperdal, Saphris, Depakote, Fanapt, Abilify, Rexulti, Wellbutrin, Provigil, Nuvigil, Vyvanse, Adderall (stopped when she was hospitalized for psychosis 02/2021) Strattera reportedly made her  blood pressure drop.  Hospitalization 02/2021-for psychosis  ECT  Allergies: Strattera [atomoxetine]  Current Medications:  Current Outpatient Medications:    ALPRAZolam (XANAX) 1 MG tablet, TAKE 1/2 TO 1 TABLET(0.5 TO 1 MG) BY MOUTH THREE TIMES DAILY AS NEEDED FOR ANXIETY, Disp: 45 tablet, Rfl: 5   Cholecalciferol 125 MCG (5000 UT) capsule, Take 5,000 Units by mouth daily., Disp: , Rfl:    estradiol (ESTRACE) 0.5 MG tablet, Take 0.5 tablets by mouth daily., Disp: , Rfl:    lamoTRIgine (LAMICTAL) 200 MG tablet, TAKE 1 TABLET(200 MG) BY MOUTH DAILY, Disp: 90 tablet, Rfl: 3   levothyroxine (SYNTHROID) 137 MCG tablet, Take 137 mcg by mouth daily before breakfast., Disp: , Rfl:    liothyronine (CYTOMEL) 5 MCG tablet, Take 5 mcg by mouth daily., Disp: , Rfl:    PARoxetine (PAXIL) 40 MG tablet, Take 1 tablet (40 mg total) by mouth every morning., Disp: 90 tablet, Rfl: 3   progesterone (PROMETRIUM) 200 MG capsule, Take 200 mg by mouth daily., Disp: , Rfl:    simvastatin (ZOCOR) 20 MG tablet, Take 20 mg by mouth at bedtime., Disp: , Rfl:    vitamin B-12 (CYANOCOBALAMIN) 1000 MCG tablet, Take 1,000 mcg by mouth daily., Disp: , Rfl:    amphetamine-dextroamphetamine (ADDERALL) 10 MG tablet, Take 1 tablet (10 mg total) by mouth 2 (two) times daily with breakfast and lunch., Disp: 60 tablet, Rfl: 0   QUEtiapine (SEROQUEL) 400 MG tablet, Take 2 tablets (800 mg total) by mouth at bedtime., Disp: 180 tablet, Rfl: 1 Medication Side Effects: weight gain  Family Medical/ Social History: Changes? No  MENTAL HEALTH EXAM:  There were no vitals taken for this visit.There is no height or weight on file to calculate BMI.  General Appearance: Casual, Well Groomed, and Obese  Eye  Contact:  Good  Speech:  Clear and Coherent and Normal Rate  Volume:  Normal  Mood:  Euthymic  Affect:  Congruent  Thought Process:  Goal Directed and Descriptions of Associations: Circumstantial  Orientation:  Full (Time, Place,  and Person)  Thought Content: Logical   Suicidal Thoughts:  No  Homicidal Thoughts:  No  Memory:  WNL  Judgement:  Good  Insight:  Good  Psychomotor Activity:  Normal  Concentration:  Concentration: Good and Attention Span: Good  Recall:  Fair  Fund of Knowledge: Good  Language: Good  Assets:  Desire for Improvement Financial Resources/Insurance Housing Transportation  ADL's:  Intact  Cognition: WNL  Prognosis:  Good   DIAGNOSES:    ICD-10-CM   1. Bipolar I disorder ([facility])  F31.9     2. Attention deficit hyperactivity disorder (ADHD), predominantly inattentive type  F90.0     3. 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 Generalized anxiety disorder  F41.1     5. Weight gain  R63.5       Receiving Psychotherapy: Yes   Lindsay [name]  RECOMMENDATIONS:  PDMP was reviewed.  Last Xanax filled 05/12/2022. Adderall filled 04/18/2022. I provided 30 minutes of face to face time during this encounter, including time spent before and after the visit in records review, medical decision making, counseling pertinent to today's visit, and charting.   Get labs from PCP. ROI signed. Consider adding metformin, or change Seroquel to [place]bal[place]. If pt hasn't heard anything from our office in 5 business days, call, so we can decide which route to take.  Sleep hygiene discussed.  She understands the upper/downer effect of Adderall and Xanax. Doesn't take it during the day very often, so ok to do in emergencies. Ok to take at hs.   Continue Xanax 1 mg, 1/2-1 po q hs prn sleep.  Continue Adderall 10 mg, 1 po bid.  Continue Lamictal 200 mg, 1 p.o. daily. Continue Paxil 40 mg, 1 p.o. daily.   Continue Seroquel 400 mg, 2 po qhs.  Continue multivitamin, B12, and vitamin D. Continue therapy. Return in 6 weeks.   Lindsay Overly, PA-C

## 2022-05-21 ENCOUNTER — Other Ambulatory Visit: Payer: Self-pay

## 2022-05-21 ENCOUNTER — Telehealth: Payer: Self-pay | Admitting: Physician Assistant

## 2022-05-21 MED ORDER — AMPHETAMINE-DEXTROAMPHETAMINE 20 MG PO TABS: 10.0000 mg | ORAL_TABLET | Freq: Two times a day (BID) | ORAL | 0 refills | Status: DC

## 2022-05-21 NOTE — Telephone Encounter (Signed)
Pended.

## 2022-05-21 NOTE — Telephone Encounter (Signed)
Patient called for Adderall 10mg  refill states that she was supposed to pick it up today but pharmacy doesn't have it in stock. She called around and found pharmacy that has 20mg  in stock and would like prescription for that so she can break it in half. Ph: [phone] Appt 1/2 Pharmacy [facility] [address] [place], [address]

## 2022-06-04 DIAGNOSIS — F3181 Bipolar II disorder: Secondary | ICD-10-CM | POA: Diagnosis not present

## 2022-06-09 DIAGNOSIS — M79671 Pain in right foot: Secondary | ICD-10-CM | POA: Diagnosis not present

## 2022-06-20 DIAGNOSIS — F3181 Bipolar II disorder: Secondary | ICD-10-CM | POA: Diagnosis not present

## 2022-07-01 ENCOUNTER — Ambulatory Visit (INDEPENDENT_AMBULATORY_CARE_PROVIDER_SITE_OTHER): Payer: BC Managed Care – PPO | Admitting: Physician Assistant

## 2022-07-01 ENCOUNTER — Encounter: Payer: Self-pay | Admitting: Physician Assistant

## 2022-07-01 DIAGNOSIS — F429 Obsessive-compulsive disorder, unspecified: Secondary | ICD-10-CM

## 2022-07-01 DIAGNOSIS — G4700 Insomnia, unspecified: Secondary | ICD-10-CM

## 2022-07-01 DIAGNOSIS — F900 Attention-deficit hyperactivity disorder, predominantly inattentive type: Secondary | ICD-10-CM

## 2022-07-01 DIAGNOSIS — R635 Abnormal weight gain: Secondary | ICD-10-CM

## 2022-07-01 DIAGNOSIS — F319 Bipolar disorder, unspecified: Secondary | ICD-10-CM | POA: Diagnosis not present

## 2022-07-01 DIAGNOSIS — F411 Generalized anxiety disorder: Secondary | ICD-10-CM | POA: Diagnosis not present

## 2022-07-01 DIAGNOSIS — M79671 Pain in right foot: Secondary | ICD-10-CM | POA: Diagnosis not present

## 2022-07-01 MED ORDER — PAROXETINE HCL 40 MG PO TABS: 40.0000 mg | ORAL_TABLET | ORAL | 3 refills | Status: DC

## 2022-07-01 MED ORDER — METFORMIN HCL 500 MG PO TABS: ORAL_TABLET | ORAL | 1 refills | Status: DC

## 2022-07-01 MED ORDER — AMPHETAMINE-DEXTROAMPHETAMINE 20 MG PO TABS: 10.0000 mg | ORAL_TABLET | Freq: Two times a day (BID) | ORAL | 0 refills | Status: DC

## 2022-07-01 NOTE — Progress Notes (Signed)
[facility]  Patient ID: Lindsay [name],  MRN: [id number]  PCP: Caren Macadam, MD  Date of Evaluation: 07/01/2022 Time spent:30 minutes  Chief Complaint:  Chief Complaint   Anxiety; ADHD; Depression; Follow-up    HISTORY/CURRENT STATUS: HPI For routine med check.  As far as her mood goes, she is doing well. Patient is able to enjoy things.  Energy and motivation are good.  No extreme sadness, tearfulness, or feelings of hopelessness.  Sleeps well most of the time. ADLs and personal hygiene are normal.    Appetite has not changed.  Weight is stable although she has gained weight since being on the Seroquel.  She asks about metformin, we disc at 1 point.  Not having a lot of anxiety, although it does occur sometimes.  Xanax is helpful.  She takes it sparingly.  Does not get panic attacks.  Just more of a sense of unease.  Denies suicidal or homicidal thoughts.  Being back on the Adderall has been very helpful. States that attention is good without easy distractibility.  Able to focus on things and finish tasks to completion.   Patient denies increased energy with decreased need for sleep, increased talkativeness, racing thoughts, impulsivity or risky behaviors, increased spending, increased libido, grandiosity, increased irritability or anger, paranoia, or hallucinations.  Denies dizziness, syncope, seizures, numbness, tingling, tremor, tics, unsteady gait, slurred speech, confusion. Denies muscle or joint pain, stiffness, or dystonia. Denies unexplained weight loss, frequent infections, or sores that heal slowly.  No polydipsia, or polyuria. Denies visual changes or paresthesias.   Individual Medical History/ Review of Systems: Changes? :No     Past medications for mental health diagnoses include: Geodon, Lamictal, Latuda, Paxil, trazodone, Ambien, Seroquel, Vraylar, Risperdal, Saphris, Depakote, Fanapt, Abilify, Rexulti, Wellbutrin, Provigil, Nuvigil, Vyvanse, Adderall  (stopped when she was hospitalized for psychosis 02/2021) Strattera reportedly made her blood pressure drop.  Hospitalization 02/2021-for psychosis  ECT  Allergies: Strattera [atomoxetine]  Current Medications:  Current Outpatient Medications:    ALPRAZolam (XANAX) 1 MG tablet, TAKE 1/2 TO 1 TABLET(0.5 TO 1 MG) BY MOUTH THREE TIMES DAILY AS NEEDED FOR ANXIETY, Disp: 45 tablet, Rfl: 5   amphetamine-dextroamphetamine (ADDERALL) 10 MG tablet, Take 1 tablet (10 mg total) by mouth 2 (two) times daily with breakfast and lunch., Disp: 60 tablet, Rfl: 0   Cholecalciferol 125 MCG (5000 UT) capsule, Take 5,000 Units by mouth daily., Disp: , Rfl:    estradiol (ESTRACE) 0.5 MG tablet, Take 0.5 tablets by mouth daily., Disp: , Rfl:    lamoTRIgine (LAMICTAL) 200 MG tablet, TAKE 1 TABLET(200 MG) BY MOUTH DAILY, Disp: 90 tablet, Rfl: 3   levothyroxine (SYNTHROID) 137 MCG tablet, Take 137 mcg by mouth daily before breakfast., Disp: , Rfl:    liothyronine (CYTOMEL) 5 MCG tablet, Take 5 mcg by mouth daily., Disp: , Rfl:    metFORMIN (GLUCOPHAGE) 500 MG tablet, 1 po qd for 1 week, then increase to 1 po bid with food., Disp: 60 tablet, Rfl: 1   progesterone (PROMETRIUM) 200 MG capsule, Take 200 mg by mouth daily., Disp: , Rfl:    QUEtiapine (SEROQUEL) 400 MG tablet, Take 2 tablets (800 mg total) by mouth at bedtime., Disp: 180 tablet, Rfl: 1   simvastatin (ZOCOR) 20 MG tablet, Take 20 mg by mouth at bedtime., Disp: , Rfl:    vitamin B-12 (CYANOCOBALAMIN) 1000 MCG tablet, Take 1,000 mcg by mouth daily., Disp: , Rfl:    amphetamine-dextroamphetamine (ADDERALL) 20 MG tablet, Take 0.5 tablets (10  mg total) by mouth 2 (two) times daily., Disp: 30 tablet, Rfl: 0   PARoxetine (PAXIL) 40 MG tablet, Take 1 tablet (40 mg total) by mouth every morning., Disp: 90 tablet, Rfl: 3 Medication Side Effects: weight gain  Family Medical/ Social History: Changes? No  MENTAL HEALTH EXAM:  There were no vitals taken for this  visit.There is no height or weight on file to calculate BMI.  General Appearance: Casual, Well Groomed, and Obese  Eye Contact:  Good  Speech:  Clear and Coherent and Normal Rate  Volume:  Normal  Mood:  Euthymic  Affect:  Congruent  Thought Process:  Goal Directed and Descriptions of Associations: Circumstantial  Orientation:  Full (Time, Place, and Person)  Thought Content: Logical   Suicidal Thoughts:  No  Homicidal Thoughts:  No  Memory:  WNL  Judgement:  Good  Insight:  Good  Psychomotor Activity:  Normal  Concentration:  Concentration: Good and Attention Span: Good  Recall:  [place] of Knowledge: Good  Language: Good  Assets:  Desire for Improvement  ADL's:  Intact  Cognition: WNL  Prognosis:  Good   From [facility], received 06/27/2022 05/13/2022 TSH 0.84 04/18/2022 hemoglobin A1c 5.7  07/09/2021 CBC normal 07/09/2021 CMP kidney function, liver function normal as was the remainder of CMP Cholesterol 203, triglycerides 59, HDL 81, LDL 112  DIAGNOSES:    ICD-10-CM   1. Bipolar I disorder ([place])  F31.9     2. Attention deficit hyperactivity disorder (ADHD), predominantly inattentive type  F90.0     3. 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 Generalized anxiety disorder  F41.1     5. Weight gain  R63.5     6. Insomnia, unspecified type  G47.00      Receiving Psychotherapy: Yes   Kandra Nicolas  RECOMMENDATIONS:  PDMP was reviewed.  Last Xanax filled 06/16/2022.  Adderall filled 05/23/2022. I provided 30 minutes of face to face time during this encounter, including time spent before and after the visit in records review, medical decision making, counseling pertinent to today's visit, and charting.   We discussed the weight gain caused by an atypical antipsychotic.  Metformin is often given to help with any metabolic risks.  We discussed this, her most recent labs and the fact that she has a physical and labs with her PCP  in the next few weeks.  Her hemoglobin A1c was 5.7, 2 months ago so technically she is prediabetic and would benefit from the metformin due to that as well.  Benefits, risks, and side effects were discussed and she accepts.  Continue Xanax 1 mg, 1/2-1 po q hs prn sleep.  Continue Adderall 10 mg, 1 po bid.  Continue Lamictal 200 mg, 1 p.o. daily. Start metformin 500 mg, 1 p.o. daily for 1 week then increase to 1 p.o. twice daily, with food. Continue Paxil 40 mg, 1 p.o. daily.   Continue Seroquel 400 mg, 2 po qhs.  Continue multivitamin, B12, and vitamin D. Continue therapy. Return in 4 weeks.   Donnal Moat, PA-C

## 2022-07-14 DIAGNOSIS — F3181 Bipolar II disorder: Secondary | ICD-10-CM | POA: Diagnosis not present

## 2022-07-23 DIAGNOSIS — Z800 Family history of malignant neoplasm of digestive organs: Secondary | ICD-10-CM | POA: Diagnosis not present

## 2022-07-23 DIAGNOSIS — Z1211 Encounter for screening for malignant neoplasm of colon: Secondary | ICD-10-CM | POA: Diagnosis not present

## 2022-07-23 DIAGNOSIS — Z83719 Family history of colon polyps, unspecified: Secondary | ICD-10-CM | POA: Diagnosis not present

## 2022-07-23 DIAGNOSIS — K648 Other hemorrhoids: Secondary | ICD-10-CM | POA: Diagnosis not present

## 2022-07-25 ENCOUNTER — Other Ambulatory Visit: Payer: Self-pay

## 2022-07-25 ENCOUNTER — Telehealth: Payer: Self-pay | Admitting: Physician Assistant

## 2022-07-25 DIAGNOSIS — R7303 Prediabetes: Secondary | ICD-10-CM | POA: Diagnosis not present

## 2022-07-25 DIAGNOSIS — Z79899 Other long term (current) drug therapy: Secondary | ICD-10-CM | POA: Diagnosis not present

## 2022-07-25 DIAGNOSIS — E785 Hyperlipidemia, unspecified: Secondary | ICD-10-CM | POA: Diagnosis not present

## 2022-07-25 DIAGNOSIS — E039 Hypothyroidism, unspecified: Secondary | ICD-10-CM | POA: Diagnosis not present

## 2022-07-25 DIAGNOSIS — Z0000 Encounter for general adult medical examination without abnormal findings: Secondary | ICD-10-CM | POA: Diagnosis not present

## 2022-07-25 DIAGNOSIS — Z9884 Bariatric surgery status: Secondary | ICD-10-CM | POA: Diagnosis not present

## 2022-07-25 MED ORDER — AMPHETAMINE-DEXTROAMPHETAMINE 20 MG PO TABS: 10.0000 mg | ORAL_TABLET | Freq: Two times a day (BID) | ORAL | 0 refills | Status: DC

## 2022-07-25 NOTE — Telephone Encounter (Signed)
Pt requesting generic adderall Rx to [facility] has only 20 mg. Please send for 20 mg 1/2 2/d. Will run out before apt Tuesday. RF date is Monday.

## 2022-07-25 NOTE — Telephone Encounter (Signed)
Pended.

## 2022-07-25 NOTE — Telephone Encounter (Signed)
1/29 is ok.

## 2022-07-25 NOTE — Telephone Encounter (Signed)
Fill date was 07/01/22 4 week date is 1/29.Do you want her to pick it up on 2/1?She stated she will be out before her appt Tuesday

## 2022-07-28 DIAGNOSIS — F3181 Bipolar II disorder: Secondary | ICD-10-CM | POA: Diagnosis not present

## 2022-07-29 ENCOUNTER — Ambulatory Visit (INDEPENDENT_AMBULATORY_CARE_PROVIDER_SITE_OTHER): Payer: BC Managed Care – PPO | Admitting: Physician Assistant

## 2022-07-29 ENCOUNTER — Encounter: Payer: Self-pay | Admitting: Physician Assistant

## 2022-07-29 DIAGNOSIS — R635 Abnormal weight gain: Secondary | ICD-10-CM

## 2022-07-29 DIAGNOSIS — G47419 Narcolepsy without cataplexy: Secondary | ICD-10-CM

## 2022-07-29 DIAGNOSIS — F429 Obsessive-compulsive disorder, unspecified: Secondary | ICD-10-CM | POA: Diagnosis not present

## 2022-07-29 DIAGNOSIS — F411 Generalized anxiety disorder: Secondary | ICD-10-CM | POA: Diagnosis not present

## 2022-07-29 DIAGNOSIS — F900 Attention-deficit hyperactivity disorder, predominantly inattentive type: Secondary | ICD-10-CM

## 2022-07-29 DIAGNOSIS — F319 Bipolar disorder, unspecified: Secondary | ICD-10-CM

## 2022-07-29 DIAGNOSIS — G4700 Insomnia, unspecified: Secondary | ICD-10-CM

## 2022-07-29 MED ORDER — METFORMIN HCL ER (MOD) 1000 MG PO TB24: 1000.0000 mg | ORAL_TABLET | Freq: Every day | ORAL | 1 refills | Status: DC

## 2022-07-29 NOTE — Progress Notes (Unsigned)
[facility]  Patient ID: Lindsay [name],  MRN: [id number]  PCP: Caren Macadam, MD  Date of Evaluation: 07/29/2022 Time spent:30 minutes  Chief Complaint:  Chief Complaint   Anxiety; Depression; Follow-up    HISTORY/CURRENT STATUS: HPI For routine med check.  Started Metformin a month ago, has lost about 5 # but could be in part that she had to fast for colonoscopy during that time. At any rate, she would like to stay on the metformin to help combat metabolic effects from AP.  No SE from the metformin.   Patient is able to enjoy things.  Energy and motivation are good.  Doesn't work outside the home.   No extreme sadness, tearfulness, or feelings of hopelessness.   ADLs and personal hygiene are normal.   Denies any changes in concentration, making decisions, or remembering things.  Appetite has not changed.  Not having anxiety much. Does take the Xanax at night sometimes to help her go to sleep.  Not obsessing like she has in the past.  Denies suicidal or homicidal thoughts.  Patient denies increased energy with decreased need for sleep, increased talkativeness, racing thoughts, impulsivity or risky behaviors, increased spending, increased libido, grandiosity, increased irritability or anger, paranoia, or hallucinations.  Denies dizziness, syncope, seizures, numbness, tingling, tremor, tics, unsteady gait, slurred speech, confusion. Denies muscle or joint pain, stiffness, or dystonia. Denies unexplained weight loss, frequent infections, or sores that heal slowly.  No polydipsia, or polyuria. Denies visual changes or paresthesias.   Individual Medical History/ Review of Systems: Changes? :No     Past medications for mental health diagnoses include: Geodon, Lamictal, Latuda, Paxil, trazodone, Ambien, Seroquel, Vraylar, Risperdal, Saphris, Depakote, Fanapt, Abilify, Rexulti, Wellbutrin, Provigil, Nuvigil, Vyvanse, Adderall (stopped when she was hospitalized for psychosis 02/2021)  Strattera reportedly made her blood pressure drop.  Hospitalization 02/2021-for psychosis  ECT  Allergies: Strattera [atomoxetine]  Current Medications:  Current Outpatient Medications:    ALPRAZolam (XANAX) 1 MG tablet, TAKE 1/2 TO 1 TABLET(0.5 TO 1 MG) BY MOUTH THREE TIMES DAILY AS NEEDED FOR ANXIETY, Disp: 45 tablet, Rfl: 5   amphetamine-dextroamphetamine (ADDERALL) 10 MG tablet, Take 1 tablet (10 mg total) by mouth 2 (two) times daily with breakfast and lunch., Disp: 60 tablet, Rfl: 0   amphetamine-dextroamphetamine (ADDERALL) 20 MG tablet, Take 0.5 tablets (10 mg total) by mouth 2 (two) times daily., Disp: 30 tablet, Rfl: 0   Cholecalciferol 125 MCG (5000 UT) capsule, Take 5,000 Units by mouth daily., Disp: , Rfl:    estradiol (ESTRACE) 0.5 MG tablet, Take 0.5 tablets by mouth daily., Disp: , Rfl:    lamoTRIgine (LAMICTAL) 200 MG tablet, TAKE 1 TABLET(200 MG) BY MOUTH DAILY, Disp: 90 tablet, Rfl: 3   levothyroxine (SYNTHROID) 137 MCG tablet, Take 137 mcg by mouth daily before breakfast., Disp: , Rfl:    liothyronine (CYTOMEL) 5 MCG tablet, Take 5 mcg by mouth daily., Disp: , Rfl:    metFORMIN (GLUMETZA) 1000 MG (MOD) 24 hr tablet, Take 1 tablet (1,000 mg total) by mouth daily with supper., Disp: 30 tablet, Rfl: 1   PARoxetine (PAXIL) 40 MG tablet, Take 1 tablet (40 mg total) by mouth every morning., Disp: 90 tablet, Rfl: 3   progesterone (PROMETRIUM) 200 MG capsule, Take 200 mg by mouth daily., Disp: , Rfl:    QUEtiapine (SEROQUEL) 400 MG tablet, Take 2 tablets (800 mg total) by mouth at bedtime., Disp: 180 tablet, Rfl: 1   simvastatin (ZOCOR) 20 MG tablet, Take 20 mg by  mouth at bedtime., Disp: , Rfl:    vitamin B-12 (CYANOCOBALAMIN) 1000 MCG tablet, Take 1,000 mcg by mouth daily., Disp: , Rfl:  Medication Side Effects: weight gain  Family Medical/ Social History: Changes? No  MENTAL HEALTH EXAM:  There were no vitals taken for this visit.There is no height or weight on file to  calculate BMI.  General Appearance: Casual, Well Groomed, and Obese  Eye Contact:  Good  Speech:  Clear and Coherent and Normal Rate  Volume:  Normal  Mood:  Euthymic  Affect:  Congruent  Thought Process:  Goal Directed and Descriptions of Associations: Circumstantial  Orientation:  Full (Time, Place, and Person)  Thought Content: Logical   Suicidal Thoughts:  No  Homicidal Thoughts:  No  Memory:  WNL  Judgement:  Good  Insight:  Good  Psychomotor Activity:  Normal  Concentration:  Concentration: Good and Attention Span: Good  Recall:  Fair  Fund of Knowledge: Good  Language: Good  Assets:  Desire for Improvement Financial Resources/Insurance Housing Physical Health Resilience Transportation  ADL's:  Intact  Cognition: WNL  Prognosis:  Good   Labs under Care Everywhere through [facility], also scanned under labs. 07/25/2022 CBC with differential normal Glucose 87, BUN and creatinine are normal, LFTs all normal, electrolytes normal Lipid panel completely normal Hemoglobin A1c is 5.7 Vitamin D is normal.  DIAGNOSES:    ICD-10-CM   1. Bipolar I disorder ([place])  F31.9     2. 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 Attention deficit hyperactivity disorder (ADHD), predominantly inattentive type  F90.0     4. Generalized anxiety disorder  F41.1     5. Weight gain  R63.5     6. Insomnia, unspecified type  G47.00     7. 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  Last Xanax filled 07/18/2022. Adderall filled 07/01/2022. I provided 30 minutes of face to face time during this encounter, including time spent before and after the visit in records review, medical decision making, counseling pertinent to today's visit, and charting.   She is responding well to the metformin, her hemoglobin A1c is almost in prediabetes range.  CMP is completely  normal so no concerns of taking it.  Will continue same dose but have her take 1 g with her evening meal instead of taking 500 mg twice daily.  Continue Xanax 1 mg, 1/2-1 po q hs prn sleep.  Take sparingly. Continue Adderall 10 mg, 1 po bid.  Continue Lamictal 200 mg, 1 p.o. daily. Change metformin to extended release, 1000 mg q. evening with food. Continue Paxil 40 mg, 1 p.o. daily.   Continue Seroquel 400 mg, 2 po qhs.  Continue multivitamin, B12, and vitamin D. Continue therapy. Return in 4 weeks.   Donnal Moat, PA-C

## 2022-08-13 DIAGNOSIS — M79661 Pain in right lower leg: Secondary | ICD-10-CM | POA: Diagnosis not present

## 2022-08-13 DIAGNOSIS — M5441 Lumbago with sciatica, right side: Secondary | ICD-10-CM | POA: Diagnosis not present

## 2022-08-18 DIAGNOSIS — F3181 Bipolar II disorder: Secondary | ICD-10-CM | POA: Diagnosis not present

## 2022-08-21 DIAGNOSIS — M9902 Segmental and somatic dysfunction of thoracic region: Secondary | ICD-10-CM | POA: Diagnosis not present

## 2022-08-21 DIAGNOSIS — M5441 Lumbago with sciatica, right side: Secondary | ICD-10-CM | POA: Diagnosis not present

## 2022-08-21 DIAGNOSIS — M62830 Muscle spasm of back: Secondary | ICD-10-CM | POA: Diagnosis not present

## 2022-08-21 DIAGNOSIS — M9903 Segmental and somatic dysfunction of lumbar region: Secondary | ICD-10-CM | POA: Diagnosis not present

## 2022-08-22 DIAGNOSIS — M5441 Lumbago with sciatica, right side: Secondary | ICD-10-CM | POA: Diagnosis not present

## 2022-08-25 ENCOUNTER — Telehealth: Payer: Self-pay | Admitting: Physician Assistant

## 2022-08-25 DIAGNOSIS — M5441 Lumbago with sciatica, right side: Secondary | ICD-10-CM | POA: Diagnosis not present

## 2022-08-25 NOTE — Telephone Encounter (Signed)
Next visit is 09/01/22. Requesting refills on Adderall and Xanax called to:  [facility] [place] - [address] AT [address]   Phone: [phone]  Fax: [phone]   Its in stock

## 2022-08-26 ENCOUNTER — Other Ambulatory Visit: Payer: Self-pay

## 2022-08-26 DIAGNOSIS — M5441 Lumbago with sciatica, right side: Secondary | ICD-10-CM | POA: Diagnosis not present

## 2022-08-26 MED ORDER — ALPRAZOLAM 1 MG PO TABS: ORAL_TABLET | ORAL | 0 refills | Status: DC

## 2022-08-26 MED ORDER — AMPHETAMINE-DEXTROAMPHETAMINE 10 MG PO TABS: 10.0000 mg | ORAL_TABLET | Freq: Two times a day (BID) | ORAL | 0 refills | Status: DC

## 2022-08-26 NOTE — Telephone Encounter (Signed)
Pended.

## 2022-09-01 ENCOUNTER — Encounter: Payer: Self-pay | Admitting: Physician Assistant

## 2022-09-01 ENCOUNTER — Ambulatory Visit: Payer: BC Managed Care – PPO | Admitting: Physician Assistant

## 2022-09-01 DIAGNOSIS — F429 Obsessive-compulsive disorder, unspecified: Secondary | ICD-10-CM | POA: Diagnosis not present

## 2022-09-01 DIAGNOSIS — G4700 Insomnia, unspecified: Secondary | ICD-10-CM

## 2022-09-01 DIAGNOSIS — F900 Attention-deficit hyperactivity disorder, predominantly inattentive type: Secondary | ICD-10-CM

## 2022-09-01 DIAGNOSIS — R635 Abnormal weight gain: Secondary | ICD-10-CM

## 2022-09-01 DIAGNOSIS — G47419 Narcolepsy without cataplexy: Secondary | ICD-10-CM

## 2022-09-01 DIAGNOSIS — F319 Bipolar disorder, unspecified: Secondary | ICD-10-CM | POA: Diagnosis not present

## 2022-09-01 NOTE — Progress Notes (Signed)
[facility]  Patient ID: Lindsay [name],  MRN: [id number]  PCP: Caren Macadam, MD  Date of Evaluation: 09/01/2022 Time spent:20 minutes  Chief Complaint:  Chief Complaint   Depression; Anxiety; ADD; Follow-up    HISTORY/CURRENT STATUS: HPI For routine med check.  Wasn't able to get Metformin ER due to cost.  Her insurance would not pay for it.  She continued the short acting off and on but has not been diligent.  It makes her nauseated.  She has not been taking it with food though.  She exercises daily, sometimes for as much as 3 hours.  Still has a hard time with weight loss.  Does eat more carbs than she should.  Patient is able to enjoy things.  Energy and motivation are good.  No extreme sadness, tearfulness, or feelings of hopelessness.  ADLs and personal hygiene are normal. States that attention is good without easy distractibility.  Able to focus on things and finish tasks to completion.   Appetite has not changed.  Weight is stable.  Denies suicidal or homicidal thoughts.  Sleeps well most of the time but that is as long as she takes the Seroquel.  She ends up taking the Xanax some nights because she cannot get her mind to shut off even with the Seroquel.  Patient denies increased energy with decreased need for sleep, increased talkativeness, racing thoughts, impulsivity or risky behaviors, increased spending, increased libido, grandiosity, increased irritability or anger, paranoia, or hallucinations.  Denies dizziness, syncope, seizures, numbness, tingling, tremor, tics, unsteady gait, slurred speech, confusion. Denies muscle or joint pain, stiffness, or dystonia. Denies unexplained weight loss, frequent infections, or sores that heal slowly.  No polydipsia, or polyuria. Denies visual changes or paresthesias.   Individual Medical History/ Review of Systems: Changes? :No     Past medications for mental health diagnoses include: Geodon, Lamictal, Latuda, Paxil,  trazodone, Ambien, Seroquel, Vraylar, Risperdal, Saphris, Depakote, Fanapt, Abilify, Rexulti, Wellbutrin, Provigil, Nuvigil, Vyvanse, Adderall (stopped when she was hospitalized for psychosis 02/2021) Strattera reportedly made her blood pressure drop.  Hospitalization 02/2021-for psychosis  ECT  Allergies: Strattera [atomoxetine]  Current Medications:  Current Outpatient Medications:    ALPRAZolam (XANAX) 1 MG tablet, TAKE 1/2 TO 1 TABLET(0.5 TO 1 MG) BY MOUTH THREE TIMES DAILY AS NEEDED FOR ANXIETY, Disp: 45 tablet, Rfl: 0   amphetamine-dextroamphetamine (ADDERALL) 10 MG tablet, Take 1 tablet (10 mg total) by mouth 2 (two) times daily with breakfast and lunch., Disp: 60 tablet, Rfl: 0   amphetamine-dextroamphetamine (ADDERALL) 20 MG tablet, Take 0.5 tablets (10 mg total) by mouth 2 (two) times daily., Disp: 30 tablet, Rfl: 0   Cholecalciferol 125 MCG (5000 UT) capsule, Take 5,000 Units by mouth daily., Disp: , Rfl:    estradiol (ESTRACE) 0.5 MG tablet, Take 0.5 tablets by mouth daily., Disp: , Rfl:    lamoTRIgine (LAMICTAL) 200 MG tablet, TAKE 1 TABLET(200 MG) BY MOUTH DAILY, Disp: 90 tablet, Rfl: 3   levothyroxine (SYNTHROID) 137 MCG tablet, Take 137 mcg by mouth daily before breakfast., Disp: , Rfl:    liothyronine (CYTOMEL) 5 MCG tablet, Take 5 mcg by mouth daily., Disp: , Rfl:    metFORMIN (GLUMETZA) 1000 MG (MOD) 24 hr tablet, Take 1 tablet (1,000 mg total) by mouth daily with supper., Disp: 30 tablet, Rfl: 1   PARoxetine (PAXIL) 40 MG tablet, Take 1 tablet (40 mg total) by mouth every morning., Disp: 90 tablet, Rfl: 3   progesterone (PROMETRIUM) 200 MG capsule, Take 200  mg by mouth daily., Disp: , Rfl:    QUEtiapine (SEROQUEL) 400 MG tablet, Take 2 tablets (800 mg total) by mouth at bedtime., Disp: 180 tablet, Rfl: 1   simvastatin (ZOCOR) 20 MG tablet, Take 20 mg by mouth at bedtime., Disp: , Rfl:    vitamin B-12 (CYANOCOBALAMIN) 1000 MCG tablet, Take 1,000 mcg by mouth daily., Disp: ,  Rfl:  Medication Side Effects: weight gain  Family Medical/ Social History: Changes? No  MENTAL HEALTH EXAM:  There were no vitals taken for this visit.There is no height or weight on file to calculate BMI.   #200 this morning at the gym  General Appearance: Casual, Well Groomed, and Obese  Eye Contact:  Good  Speech:  Clear and Coherent and Normal Rate  Volume:  Normal  Mood:  Euthymic  Affect:  Congruent  Thought Process:  Goal Directed and Descriptions of Associations: Circumstantial  Orientation:  Full (Time, Place, and Person)  Thought Content: Logical   Suicidal Thoughts:  No  Homicidal Thoughts:  No  Memory:  WNL  Judgement:  Good  Insight:  Good  Psychomotor Activity:  Normal  Concentration:  Concentration: Good and Attention Span: Good  Recall:  Fair  Fund of Knowledge: Good  Language: Good  Assets:  Desire for Improvement Financial Resources/Insurance Housing Physical Health Resilience Transportation  ADL's:  Intact  Cognition: WNL  Prognosis:  Good   Labs under Care Everywhere through [facility], also scanned under labs.  DIAGNOSES:    ICD-10-CM   1. Bipolar I disorder ([place])  F31.9     2. Attention deficit hyperactivity disorder (ADHD), predominantly inattentive type  F90.0     3. 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 Weight gain  R63.5     5. 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 Insomnia, unspecified type  G47.00       Receiving Psychotherapy: Yes   Kandra Nicolas  RECOMMENDATIONS:  PDMP was reviewed.  Last Xanax filled 08/26/2022.  Adderall filled 08/26/2022. I provided 20 minutes of face to face time during this encounter, including time spent before and after the visit in records review, medical decision making, counseling pertinent to today's visit, and charting.   She will retry the metformin, with food in the evening.  If it is still causing nausea or is not effective with appetite  suppression or weight loss, I recommend changing Seroquel to Lybalvi.  Knowing that we may need to do a low dose of Seroquel to help with sleep.  Continue Xanax 1 mg, 1/2-1 po q hs prn sleep.  Take sparingly. Continue Adderall 10 mg, 1 po bid.  Continue Lamictal 200 mg, 1 p.o. daily. Continue metformin 1000 mg q. evening with food. Continue Paxil 40 mg, 1 p.o. daily.   Continue Seroquel 400 mg, 2 po qhs.  Continue multivitamin, B12, and vitamin D. Continue therapy. Return in 4 weeks.   Donnal Moat, PA-C

## 2022-09-02 DIAGNOSIS — S76311D Strain of muscle, fascia and tendon of the posterior muscle group at thigh level, right thigh, subsequent encounter: Secondary | ICD-10-CM | POA: Diagnosis not present

## 2022-09-02 DIAGNOSIS — M6281 Muscle weakness (generalized): Secondary | ICD-10-CM | POA: Diagnosis not present

## 2022-09-02 DIAGNOSIS — M5410 Radiculopathy, site unspecified: Secondary | ICD-10-CM | POA: Diagnosis not present

## 2022-09-03 DIAGNOSIS — M5441 Lumbago with sciatica, right side: Secondary | ICD-10-CM | POA: Diagnosis not present

## 2022-09-04 DIAGNOSIS — M79675 Pain in left toe(s): Secondary | ICD-10-CM | POA: Diagnosis not present

## 2022-09-04 DIAGNOSIS — F3181 Bipolar II disorder: Secondary | ICD-10-CM | POA: Diagnosis not present

## 2022-09-05 DIAGNOSIS — M5441 Lumbago with sciatica, right side: Secondary | ICD-10-CM | POA: Diagnosis not present

## 2022-09-09 DIAGNOSIS — M5410 Radiculopathy, site unspecified: Secondary | ICD-10-CM | POA: Diagnosis not present

## 2022-09-09 DIAGNOSIS — S76311D Strain of muscle, fascia and tendon of the posterior muscle group at thigh level, right thigh, subsequent encounter: Secondary | ICD-10-CM | POA: Diagnosis not present

## 2022-09-09 DIAGNOSIS — M6281 Muscle weakness (generalized): Secondary | ICD-10-CM | POA: Diagnosis not present

## 2022-09-10 DIAGNOSIS — Z01419 Encounter for gynecological examination (general) (routine) without abnormal findings: Secondary | ICD-10-CM | POA: Diagnosis not present

## 2022-09-10 DIAGNOSIS — Z8742 Personal history of other diseases of the female genital tract: Secondary | ICD-10-CM | POA: Diagnosis not present

## 2022-09-10 DIAGNOSIS — M810 Age-related osteoporosis without current pathological fracture: Secondary | ICD-10-CM | POA: Diagnosis not present

## 2022-09-10 DIAGNOSIS — Z1231 Encounter for screening mammogram for malignant neoplasm of breast: Secondary | ICD-10-CM | POA: Diagnosis not present

## 2022-09-11 ENCOUNTER — Other Ambulatory Visit: Payer: Self-pay | Admitting: Obstetrics and Gynecology

## 2022-09-11 DIAGNOSIS — M9902 Segmental and somatic dysfunction of thoracic region: Secondary | ICD-10-CM | POA: Diagnosis not present

## 2022-09-11 DIAGNOSIS — M5441 Lumbago with sciatica, right side: Secondary | ICD-10-CM | POA: Diagnosis not present

## 2022-09-11 DIAGNOSIS — M62830 Muscle spasm of back: Secondary | ICD-10-CM | POA: Diagnosis not present

## 2022-09-11 DIAGNOSIS — M9903 Segmental and somatic dysfunction of lumbar region: Secondary | ICD-10-CM | POA: Diagnosis not present

## 2022-09-11 DIAGNOSIS — M810 Age-related osteoporosis without current pathological fracture: Secondary | ICD-10-CM

## 2022-09-12 DIAGNOSIS — M5441 Lumbago with sciatica, right side: Secondary | ICD-10-CM | POA: Diagnosis not present

## 2022-09-19 DIAGNOSIS — J209 Acute bronchitis, unspecified: Secondary | ICD-10-CM | POA: Diagnosis not present

## 2022-09-19 DIAGNOSIS — Z03818 Encounter for observation for suspected exposure to other biological agents ruled out: Secondary | ICD-10-CM | POA: Diagnosis not present

## 2022-09-19 DIAGNOSIS — R051 Acute cough: Secondary | ICD-10-CM | POA: Diagnosis not present

## 2022-09-21 ENCOUNTER — Telehealth: Payer: Self-pay

## 2022-09-21 NOTE — Telephone Encounter (Signed)
Prior Authorization submitted and approved for METFORMIN ER 1000 MG effective 08/22/2022-09/21/2023, PA# [id number]   [facility]

## 2022-09-22 DIAGNOSIS — M5441 Lumbago with sciatica, right side: Secondary | ICD-10-CM | POA: Diagnosis not present

## 2022-09-23 ENCOUNTER — Other Ambulatory Visit: Payer: Self-pay | Admitting: Physician Assistant

## 2022-09-23 ENCOUNTER — Telehealth: Payer: Self-pay | Admitting: Physician Assistant

## 2022-09-23 MED ORDER — AMPHETAMINE-DEXTROAMPHETAMINE 10 MG PO TABS: 10.0000 mg | ORAL_TABLET | Freq: Two times a day (BID) | ORAL | 0 refills | Status: DC

## 2022-09-23 NOTE — Telephone Encounter (Signed)
Pt requesting Rx generic adderall 10 mg 2/d #60 to [facility]. Apt 4/2

## 2022-09-23 NOTE — Telephone Encounter (Signed)
sent 

## 2022-09-30 ENCOUNTER — Ambulatory Visit (INDEPENDENT_AMBULATORY_CARE_PROVIDER_SITE_OTHER): Payer: BC Managed Care – PPO | Admitting: Physician Assistant

## 2022-09-30 ENCOUNTER — Encounter: Payer: Self-pay | Admitting: Physician Assistant

## 2022-09-30 DIAGNOSIS — F429 Obsessive-compulsive disorder, unspecified: Secondary | ICD-10-CM | POA: Diagnosis not present

## 2022-09-30 DIAGNOSIS — F319 Bipolar disorder, unspecified: Secondary | ICD-10-CM | POA: Diagnosis not present

## 2022-09-30 DIAGNOSIS — F411 Generalized anxiety disorder: Secondary | ICD-10-CM | POA: Diagnosis not present

## 2022-09-30 DIAGNOSIS — F900 Attention-deficit hyperactivity disorder, predominantly inattentive type: Secondary | ICD-10-CM

## 2022-09-30 MED ORDER — AMPHETAMINE-DEXTROAMPHETAMINE 10 MG PO TABS: 10.0000 mg | ORAL_TABLET | Freq: Two times a day (BID) | ORAL | 0 refills | Status: DC

## 2022-09-30 NOTE — Progress Notes (Signed)
[facility]  Patient ID: NEFELI BIRT,  MRN: [id number]  PCP: Caren Macadam, MD  Date of Evaluation: 09/30/2022 Time spent:20 minutes  Chief Complaint:  Chief Complaint   Follow-up    HISTORY/CURRENT STATUS: HPI For routine med check.  Doing well since the time changed and she's able to work in the yard. She re-tried the Metformin and she still couldn't tolerate it. Caused her to feel achy all over.  It was not helping decrease her appetite either.  She stopped it, the aches are better, and she is not eating as much.  She is comfortable staying on the current treatment for now.  Patient is able to enjoy things.  Energy and motivation are good.  No extreme sadness, tearfulness, or feelings of hopelessness.  Sleeps well most of the time. ADLs and personal hygiene are normal. Appetite has not changed.  Weight is stable.  She does get anxious at times, situational, does take the Xanax occasionally.  Denies suicidal or homicidal thoughts.  States that attention is good without easy distractibility.  Able to focus on things and finish tasks to completion.   Patient denies increased energy with decreased need for sleep, increased talkativeness, racing thoughts, impulsivity or risky behaviors, increased spending, increased libido, grandiosity, increased irritability or anger, paranoia, or hallucinations.  Denies dizziness, syncope, seizures, numbness, tingling, tremor, tics, unsteady gait, slurred speech, confusion. Denies muscle or joint pain, stiffness, or dystonia. Denies unexplained weight loss, frequent infections, or sores that heal slowly.  No polydipsia, or polyuria. Denies visual changes or paresthesias.   Individual Medical History/ Review of Systems: Changes? :No     Past medications for mental health diagnoses include: Geodon, Lamictal, Latuda, Paxil, trazodone, Ambien, Seroquel, Vraylar, Risperdal, Saphris, Depakote, Fanapt, Abilify, Rexulti, Wellbutrin, Provigil,  Nuvigil, Vyvanse, Adderall (stopped when she was hospitalized for psychosis 02/2021) Strattera reportedly made her blood pressure drop.  Hospitalization 02/2021-for psychosis  ECT  Allergies: Strattera [atomoxetine]  Current Medications:  Current Outpatient Medications:    ALPRAZolam (XANAX) 1 MG tablet, TAKE 1/2 TO 1 TABLET(0.5 TO 1 MG) BY MOUTH THREE TIMES DAILY AS NEEDED FOR ANXIETY, Disp: 45 tablet, Rfl: 0   [START ON 10/24/2022] amphetamine-dextroamphetamine (ADDERALL) 10 MG tablet, Take 1 tablet (10 mg total) by mouth 2 (two) times daily with breakfast and lunch., Disp: 60 tablet, Rfl: 0   Cholecalciferol 125 MCG (5000 UT) capsule, Take 5,000 Units by mouth daily., Disp: , Rfl:    estradiol (ESTRACE) 0.5 MG tablet, Take 0.5 tablets by mouth daily., Disp: , Rfl:    lamoTRIgine (LAMICTAL) 200 MG tablet, TAKE 1 TABLET(200 MG) BY MOUTH DAILY, Disp: 90 tablet, Rfl: 3   levothyroxine (SYNTHROID) 137 MCG tablet, Take 137 mcg by mouth daily before breakfast., Disp: , Rfl:    liothyronine (CYTOMEL) 5 MCG tablet, Take 5 mcg by mouth daily., Disp: , Rfl:    PARoxetine (PAXIL) 40 MG tablet, Take 1 tablet (40 mg total) by mouth every morning., Disp: 90 tablet, Rfl: 3   progesterone (PROMETRIUM) 200 MG capsule, Take 200 mg by mouth daily., Disp: , Rfl:    QUEtiapine (SEROQUEL) 400 MG tablet, Take 2 tablets (800 mg total) by mouth at bedtime., Disp: 180 tablet, Rfl: 1   simvastatin (ZOCOR) 20 MG tablet, Take 20 mg by mouth at bedtime., Disp: , Rfl:    vitamin B-12 (CYANOCOBALAMIN) 1000 MCG tablet, Take 1,000 mcg by mouth daily., Disp: , Rfl:    [START ON 11/22/2022] amphetamine-dextroamphetamine (ADDERALL) 10 MG tablet, Take 1  tablet (10 mg total) by mouth 2 (two) times daily with breakfast and lunch., Disp: 60 tablet, Rfl: 0   metFORMIN (GLUMETZA) 1000 MG (MOD) 24 hr tablet, Take 1 tablet (1,000 mg total) by mouth daily with supper. (Patient not taking: Reported on 09/30/2022), Disp: 30 tablet, Rfl:  1 Medication Side Effects: weight gain  Family Medical/ Social History: Changes? No  MENTAL HEALTH EXAM:  There were no vitals taken for this visit.There is no height or weight on file to calculate BMI.    General Appearance: Casual, Well Groomed, and Obese  Eye Contact:  Good  Speech:  Clear and Coherent and Normal Rate  Volume:  Normal  Mood:  Euthymic  Affect:  Congruent  Thought Process:  Goal Directed and Descriptions of Associations: Circumstantial  Orientation:  Full (Time, Place, and Person)  Thought Content: Logical   Suicidal Thoughts:  No  Homicidal Thoughts:  No  Memory:  WNL  Judgement:  Good  Insight:  Good  Psychomotor Activity:  Normal  Concentration:  Concentration: Good and Attention Span: Good  Recall:  Fair  Fund of Knowledge: Good  Language: Good  Assets:  Communication Skills Desire for Improvement Financial Resources/Insurance Housing Physical Health Resilience Transportation  ADL's:  Intact  Cognition: WNL  Prognosis:  Good   DIAGNOSES:    ICD-10-CM   1. Bipolar I disorder  F31.9     2. Attention deficit hyperactivity disorder (ADHD), predominantly inattentive type  F90.0     3. 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 Generalized anxiety disorder  F41.1       Receiving Psychotherapy: Yes   Kandra Nicolas  RECOMMENDATIONS:  PDMP was reviewed.  Last Xanax filled 08/26/2022.  Adderall filled 09/24/2022. I provided 20 minutes of face to face time during this encounter, including time spent before and after the visit in records review, medical decision making, counseling pertinent to today's visit, and charting.   She is doing well so no changes need to be made. We briefly discussed changing Seroquel to Lybalvi if needed in the future, we both are concerned about insomnia if we do that though so will make no changes now.  Continue Xanax 1 mg, 1/2-1 po q hs prn sleep.  Take sparingly. Continue Adderall 10  mg, 1 po bid.  Continue Lamictal 200 mg, 1 p.o. daily. Continue metformin 1000 mg q. evening with food. Continue Paxil 40 mg, 1 p.o. daily.   Continue Seroquel 400 mg, 2 po qhs.  Continue multivitamin, B12, and vitamin D. Continue therapy. Return in 3 months.  Donnal Moat, PA-C

## 2022-10-07 DIAGNOSIS — Z809 Family history of malignant neoplasm, unspecified: Secondary | ICD-10-CM | POA: Diagnosis not present

## 2022-10-08 DIAGNOSIS — Z808 Family history of malignant neoplasm of other organs or systems: Secondary | ICD-10-CM | POA: Diagnosis not present

## 2022-10-08 DIAGNOSIS — Z800 Family history of malignant neoplasm of digestive organs: Secondary | ICD-10-CM | POA: Diagnosis not present

## 2022-10-08 DIAGNOSIS — Z803 Family history of malignant neoplasm of breast: Secondary | ICD-10-CM | POA: Diagnosis not present

## 2022-10-09 ENCOUNTER — Telehealth: Payer: Self-pay | Admitting: Physician Assistant

## 2022-10-09 DIAGNOSIS — M5441 Lumbago with sciatica, right side: Secondary | ICD-10-CM | POA: Diagnosis not present

## 2022-10-09 NOTE — Telephone Encounter (Signed)
Pt LVM @ 4:01p.  She would like refill of Xanax to   [id number] [place], [place] - [address] AT [facility] OF [address] & [facility] [address], [place] [address] Phone: [phone]  Fax: [phone]    Next appt 7/2

## 2022-10-10 ENCOUNTER — Other Ambulatory Visit: Payer: Self-pay | Admitting: Physician Assistant

## 2022-10-10 MED ORDER — ALPRAZOLAM 1 MG PO TABS: ORAL_TABLET | ORAL | 0 refills | Status: DC

## 2022-10-10 NOTE — Telephone Encounter (Signed)
sent 

## 2022-10-13 ENCOUNTER — Other Ambulatory Visit: Payer: Self-pay | Admitting: Physician Assistant

## 2022-10-13 DIAGNOSIS — M79672 Pain in left foot: Secondary | ICD-10-CM | POA: Diagnosis not present

## 2022-10-14 ENCOUNTER — Other Ambulatory Visit: Payer: Self-pay | Admitting: Physician Assistant

## 2022-10-15 DIAGNOSIS — L578 Other skin changes due to chronic exposure to nonionizing radiation: Secondary | ICD-10-CM | POA: Diagnosis not present

## 2022-10-15 DIAGNOSIS — L570 Actinic keratosis: Secondary | ICD-10-CM | POA: Diagnosis not present

## 2022-10-15 DIAGNOSIS — L814 Other melanin hyperpigmentation: Secondary | ICD-10-CM | POA: Diagnosis not present

## 2022-10-15 DIAGNOSIS — L821 Other seborrheic keratosis: Secondary | ICD-10-CM | POA: Diagnosis not present

## 2022-10-15 DIAGNOSIS — D1801 Hemangioma of skin and subcutaneous tissue: Secondary | ICD-10-CM | POA: Diagnosis not present

## 2022-10-16 DIAGNOSIS — M5441 Lumbago with sciatica, right side: Secondary | ICD-10-CM | POA: Diagnosis not present

## 2022-10-22 ENCOUNTER — Telehealth: Payer: Self-pay

## 2022-10-22 ENCOUNTER — Other Ambulatory Visit: Payer: Self-pay | Admitting: Physician Assistant

## 2022-10-22 ENCOUNTER — Other Ambulatory Visit: Payer: Self-pay

## 2022-10-23 MED ORDER — AMPHETAMINE-DEXTROAMPHETAMINE 10 MG PO TABS: 10.0000 mg | ORAL_TABLET | Freq: Two times a day (BID) | ORAL | 0 refills | Status: DC

## 2022-10-23 NOTE — Telephone Encounter (Signed)
Rx electronically sent  

## 2022-11-04 DIAGNOSIS — M5441 Lumbago with sciatica, right side: Secondary | ICD-10-CM | POA: Diagnosis not present

## 2022-11-11 DIAGNOSIS — M79672 Pain in left foot: Secondary | ICD-10-CM | POA: Diagnosis not present

## 2022-11-11 DIAGNOSIS — M79671 Pain in right foot: Secondary | ICD-10-CM | POA: Diagnosis not present

## 2022-11-13 DIAGNOSIS — M5441 Lumbago with sciatica, right side: Secondary | ICD-10-CM | POA: Diagnosis not present

## 2022-11-20 DIAGNOSIS — M5441 Lumbago with sciatica, right side: Secondary | ICD-10-CM | POA: Diagnosis not present

## 2022-11-21 ENCOUNTER — Telehealth: Payer: Self-pay | Admitting: Physician Assistant

## 2022-11-21 ENCOUNTER — Other Ambulatory Visit: Payer: Self-pay | Admitting: Physician Assistant

## 2022-11-21 MED ORDER — ALPRAZOLAM 1 MG PO TABS: ORAL_TABLET | ORAL | 0 refills | Status: DC

## 2022-11-21 MED ORDER — AMPHETAMINE-DEXTROAMPHETAMINE 10 MG PO TABS: 10.0000 mg | ORAL_TABLET | Freq: Two times a day (BID) | ORAL | 0 refills | Status: DC

## 2022-11-21 NOTE — Telephone Encounter (Signed)
sent 

## 2022-11-21 NOTE — Telephone Encounter (Addendum)
Pt requesting RF generic adderall 10 mg and alprazolam. [facility] [address] Apt 7/2 FYI   Looks like Due 5/25 has Print status.

## 2022-11-27 ENCOUNTER — Ambulatory Visit
Admission: RE | Admit: 2022-11-27 | Discharge: 2022-11-27 | Disposition: A | Discharge status: Home / Self Care | Payer: BC Managed Care – PPO | Source: Ambulatory Visit | Attending: Family Medicine | Admitting: Family Medicine

## 2022-11-27 ENCOUNTER — Other Ambulatory Visit: Payer: Self-pay | Admitting: Family Medicine

## 2022-11-27 DIAGNOSIS — M5441 Lumbago with sciatica, right side: Secondary | ICD-10-CM | POA: Diagnosis not present

## 2022-11-27 DIAGNOSIS — E039 Hypothyroidism, unspecified: Secondary | ICD-10-CM | POA: Diagnosis not present

## 2022-11-27 DIAGNOSIS — S91341A Puncture wound with foreign body, right foot, initial encounter: Secondary | ICD-10-CM | POA: Diagnosis not present

## 2022-11-27 DIAGNOSIS — S90451A Superficial foreign body, right great toe, initial encounter: Secondary | ICD-10-CM | POA: Diagnosis not present

## 2022-11-28 ENCOUNTER — Ambulatory Visit (INDEPENDENT_AMBULATORY_CARE_PROVIDER_SITE_OTHER): Payer: BC Managed Care – PPO | Admitting: Podiatry

## 2022-11-28 DIAGNOSIS — S90851A Superficial foreign body, right foot, initial encounter: Secondary | ICD-10-CM

## 2022-11-28 DIAGNOSIS — L03031 Cellulitis of right toe: Secondary | ICD-10-CM

## 2022-11-28 DIAGNOSIS — L02611 Cutaneous abscess of right foot: Secondary | ICD-10-CM

## 2022-11-28 NOTE — Patient Instructions (Signed)
Soak Instructions    Place 1/4 cup of epsom salts in a quart of warm tap water.  Submerge your foot or feet with outer bandage intact for the initial soak; this will allow the bandage to become moist and wet for easy lift off.  Once you remove your bandage, continue to soak in the solution for 20 minutes.  This soak should be done twice a day.  Next, remove your foot or feet from solution, blot dry the affected area and cover.  You may use a band aid large enough to cover the area or use gauze and tape.  Apply other medications to the area as directed by the doctor such as polysporin neosporin.  IF YOUR SKIN BECOMES IRRITATED WHILE USING THESE INSTRUCTIONS, IT IS OKAY TO SWITCH TO  WHITE VINEGAR AND WATER. Or you may use antibacterial soap and water to keep the toe clean  Monitor for any signs/symptoms of infection. Call the office immediately if any occur or go directly to the emergency room. Call with any questions/concerns.  

## 2022-11-28 NOTE — Progress Notes (Unsigned)
Subjective:   Patient ID: Lindsay [name], female   DOB: 62 y.o.   MRN: [id number]   HPI No chief complaint on file.     ROS      Objective:  Physical Exam  ***     Assessment:  ***     Plan:  ***

## 2022-12-03 DIAGNOSIS — L570 Actinic keratosis: Secondary | ICD-10-CM | POA: Diagnosis not present

## 2022-12-05 ENCOUNTER — Ambulatory Visit: Payer: BC Managed Care – PPO | Admitting: Podiatry

## 2022-12-05 DIAGNOSIS — S90851D Superficial foreign body, right foot, subsequent encounter: Secondary | ICD-10-CM

## 2022-12-05 DIAGNOSIS — L02611 Cutaneous abscess of right foot: Secondary | ICD-10-CM

## 2022-12-05 DIAGNOSIS — L03031 Cellulitis of right toe: Secondary | ICD-10-CM

## 2022-12-11 NOTE — Progress Notes (Signed)
Subjective: Chief Complaint  Patient presents with   Foreign Object Extraction    RM 12 1 week follow up right great toe. Pt states she still has some pai but is feeling better. Toe looks a little moist that may be due to bandaging.    62 year old female presents for above concerns.  She said that she is doing much better.  Still some swelling present but overall much improved.  Denies any significant drainage or pus.  No red streaks.  No fevers or chills.  Objective: AAO x3, NAD DP/PT pulses palpable bilaterally, CRT less than 3 seconds Area incision appears to have granulated in and there is no probing, no tunneling.  There is much improved edema, erythema to the toe.  There are some slight erythema still present.  There is macerated tissue on the plantar aspect of the toe.  No fluctuation, crepitation, malodor. No pain with calf compression, swelling, warmth, erythema  Assessment: 62 year old female with resolving infection  Plan: -All treatment options discussed with the patient including all alternatives, risks, complications.  -Overall doing much better today.  Betadine was applied to the area of the macerated tissue.  Continue with daily dressing changes.  Epsom salts soaks with dry well afterwards.  Elevation.   -Monitor for any clinical signs or symptoms of infection and directed to call the office immediately should any occur or go to the ER. -Patient encouraged to call the office with any questions, concerns, change in symptoms.   Vivi Barrack DPM

## 2022-12-15 DIAGNOSIS — M5441 Lumbago with sciatica, right side: Secondary | ICD-10-CM | POA: Diagnosis not present

## 2022-12-22 ENCOUNTER — Other Ambulatory Visit: Payer: Self-pay

## 2022-12-22 ENCOUNTER — Telehealth: Payer: Self-pay | Admitting: Physician Assistant

## 2022-12-22 NOTE — Telephone Encounter (Signed)
Pt called at 4:51p.  She is in [place] now.She's asking to have the Adderall script sent to   [facility], [address] [place], [place] [address]  Next appt 7/2

## 2022-12-22 NOTE — Telephone Encounter (Signed)
Pended.

## 2022-12-22 NOTE — Telephone Encounter (Signed)
Pt came in requesting Rx adderall 10 mg 2/d [facility]. Need ASAP. Going out of town as soon as she can pick up Rx. Apt 7/2 Please advise when sent. I'll contact Pt [phone].Marland Kitchen

## 2022-12-22 NOTE — Telephone Encounter (Signed)
Lindsay [name] called at 3:09.  DO NOT send in prescription for Adderall.  She has headed out of town already so will not be here to pick it up. She will call when she gets to her destination and let ut know where to send it.

## 2022-12-23 ENCOUNTER — Other Ambulatory Visit: Payer: Self-pay

## 2022-12-23 MED ORDER — AMPHETAMINE-DEXTROAMPHETAMINE 10 MG PO TABS: 10.0000 mg | ORAL_TABLET | Freq: Two times a day (BID) | ORAL | 0 refills | Status: DC

## 2022-12-23 NOTE — Telephone Encounter (Signed)
Pended.

## 2022-12-26 ENCOUNTER — Ambulatory Visit: Payer: BC Managed Care – PPO | Admitting: Podiatry

## 2022-12-29 DIAGNOSIS — M5441 Lumbago with sciatica, right side: Secondary | ICD-10-CM | POA: Diagnosis not present

## 2022-12-30 ENCOUNTER — Encounter: Payer: Self-pay | Admitting: Physician Assistant

## 2022-12-30 ENCOUNTER — Ambulatory Visit (INDEPENDENT_AMBULATORY_CARE_PROVIDER_SITE_OTHER): Payer: BC Managed Care – PPO | Admitting: Physician Assistant

## 2022-12-30 DIAGNOSIS — R635 Abnormal weight gain: Secondary | ICD-10-CM

## 2022-12-30 DIAGNOSIS — F900 Attention-deficit hyperactivity disorder, predominantly inattentive type: Secondary | ICD-10-CM

## 2022-12-30 DIAGNOSIS — F319 Bipolar disorder, unspecified: Secondary | ICD-10-CM | POA: Diagnosis not present

## 2022-12-30 DIAGNOSIS — F429 Obsessive-compulsive disorder, unspecified: Secondary | ICD-10-CM

## 2022-12-30 DIAGNOSIS — G4700 Insomnia, unspecified: Secondary | ICD-10-CM

## 2022-12-30 DIAGNOSIS — F411 Generalized anxiety disorder: Secondary | ICD-10-CM

## 2022-12-30 MED ORDER — ALPRAZOLAM 1 MG PO TABS: ORAL_TABLET | ORAL | 5 refills | Status: DC

## 2022-12-30 MED ORDER — AMPHETAMINE-DEXTROAMPHETAMINE 10 MG PO TABS: 10.0000 mg | ORAL_TABLET | Freq: Two times a day (BID) | ORAL | 0 refills | Status: DC

## 2022-12-30 NOTE — Progress Notes (Signed)
[facility]  Patient ID: Lindsay [name],  MRN: [id number]  PCP: Aliene Beams, MD  Date of Evaluation: 12/30/2022 Time spent: 27 minutes  Chief Complaint:  Chief Complaint   Anxiety; Depression; Insomnia; Follow-up    HISTORY/CURRENT STATUS: HPI For routine med check.  Doing well for the most part. Stuck at 203# and really wants to lose more weight.  She is very diligent about exercising daily, sometimes more than once, walks or jogs several miles.  She is hungrier because of the Seroquel.  She wonders if trying Lybalvi would be an option now.  We have briefly discussed it in the past.  Patient is able to enjoy things.  Energy and motivation are good.  States she is bored and is thinking about looking for a part-time job.  No extreme sadness, tearfulness, or feelings of hopelessness.  Sleeps well, she does take Xanax every night routinely, if she does not she is afraid she may not sleep well.  This was started when she was hospitalized a couple of years ago for psychosis.  On very rare occasion she takes 1/2 pill or 1 pill during the day when she is really anxious but it has not common.  ADLs and personal hygiene are normal.   Denies any changes in concentration, making decisions, or remembering things.  Denies suicidal or homicidal thoughts.  Patient denies increased energy with decreased need for sleep, increased talkativeness, racing thoughts, impulsivity or risky behaviors, increased spending, increased libido, grandiosity, increased irritability or anger, paranoia, or hallucinations.  Denies dizziness, syncope, seizures, numbness, tingling, tremor, tics, unsteady gait, slurred speech, confusion. Denies muscle or joint pain, stiffness, or dystonia. Denies unexplained weight loss, frequent infections, or sores that heal slowly.  No polyphagia, polydipsia, or polyuria. Denies visual changes or paresthesias.   Individual Medical History/ Review of Systems: Changes? :No      Past medications for mental health diagnoses include: Geodon, Lamictal, Latuda, Paxil, trazodone, Ambien, Seroquel, Vraylar, Risperdal, Saphris, Depakote, Fanapt, Abilify, Rexulti, Wellbutrin, Provigil, Nuvigil, Vyvanse, Adderall (stopped when she was hospitalized for psychosis 02/2021) Strattera reportedly made her blood pressure drop.  Metformin for weight gain caused GI side effects.  Hospitalization 02/2021-for psychosis  ECT  Allergies: Strattera [atomoxetine]  Current Medications:  Current Outpatient Medications:    [START ON 01/21/2023] amphetamine-dextroamphetamine (ADDERALL) 10 MG tablet, Take 1 tablet (10 mg total) by mouth 2 (two) times daily with breakfast and lunch., Disp: 60 tablet, Rfl: 0   Cholecalciferol 125 MCG (5000 UT) capsule, Take 5,000 Units by mouth daily., Disp: , Rfl:    estradiol (ESTRACE) 0.5 MG tablet, Take 0.5 tablets by mouth daily., Disp: , Rfl:    lamoTRIgine (LAMICTAL) 200 MG tablet, TAKE 1 TABLET(200 MG) BY MOUTH DAILY, Disp: 90 tablet, Rfl: 3   levothyroxine (SYNTHROID) 137 MCG tablet, Take 137 mcg by mouth daily before breakfast., Disp: , Rfl:    liothyronine (CYTOMEL) 5 MCG tablet, Take 5 mcg by mouth daily., Disp: , Rfl:    PARoxetine (PAXIL) 40 MG tablet, Take 1 tablet (40 mg total) by mouth every morning., Disp: 90 tablet, Rfl: 3   progesterone (PROMETRIUM) 200 MG capsule, Take 200 mg by mouth daily., Disp: , Rfl:    QUEtiapine (SEROQUEL) 400 MG tablet, TAKE 2 TABLETS(800 MG) BY MOUTH AT BEDTIME, Disp: 180 tablet, Rfl: 1   rosuvastatin (CRESTOR) 10 MG tablet, Take 10 mg by mouth daily., Disp: , Rfl:    vitamin B-12 (CYANOCOBALAMIN) 1000 MCG tablet, Take 1,000 mcg by mouth  daily., Disp: , Rfl:    ALPRAZolam (XANAX) 1 MG tablet, TAKE 1/2 TO 1 TABLET(0.5 TO 1 MG) BY MOUTH THREE TIMES DAILY AS NEEDED FOR ANXIETY, Disp: 45 tablet, Rfl: 5   [START ON 02/20/2023] amphetamine-dextroamphetamine (ADDERALL) 10 MG tablet, Take 1 tablet (10 mg total) by mouth 2  (two) times daily with breakfast and lunch., Disp: 60 tablet, Rfl: 0   [START ON 03/22/2023] amphetamine-dextroamphetamine (ADDERALL) 10 MG tablet, Take 1 tablet (10 mg total) by mouth 2 (two) times daily with breakfast and lunch., Disp: 60 tablet, Rfl: 0   simvastatin (ZOCOR) 20 MG tablet, Take 20 mg by mouth at bedtime. (Patient not taking: Reported on 12/30/2022), Disp: , Rfl:  Medication Side Effects: weight gain  Family Medical/ Social History: Changes? No  MENTAL HEALTH EXAM:  There were no vitals taken for this visit.There is no height or weight on file to calculate BMI.    General Appearance: Casual, Well Groomed, and Obese  Eye Contact:  Good  Speech:  Clear and Coherent and Normal Rate  Volume:  Normal  Mood:  Euthymic  Affect:  Congruent  Thought Process:  Goal Directed and Descriptions of Associations: Circumstantial  Orientation:  Full (Time, Place, and Person)  Thought Content: Logical   Suicidal Thoughts:  No  Homicidal Thoughts:  No  Memory:  WNL  Judgement:  Good  Insight:  Good  Psychomotor Activity:  Normal  Concentration:  Concentration: Good and Attention Span: Good  Recall:  Fair  Fund of Knowledge: Good  Language: Good  Assets:  Communication Skills Desire for Improvement Financial Resources/Insurance Housing Physical Health Resilience Transportation  ADL's:  Intact  Cognition: WNL  Prognosis:  Good   PCP follows labs.  DIAGNOSES:    ICD-10-CM   1. Bipolar I disorder (HCC)  F31.9     2. 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 Attention deficit hyperactivity disorder (ADHD), predominantly inattentive type  F90.0     4. Generalized anxiety disorder  F41.1     5. Weight gain  R63.5     6. Insomnia, unspecified type  G47.00       Receiving Psychotherapy: Yes   Hal Neer  RECOMMENDATIONS:  PDMP was reviewed.  Last Xanax filled 11/21/2022.  Adderall filled 12/23/2022. I provided 27 minutes of face to face  time during this encounter, including time spent before and after the visit in records review, medical decision making, counseling pertinent to today's visit, and charting.   We again discussed the fact that typically a benzo and stimulant are not prescribed at the same time.  However she has narcolepsy and needs the Adderall, it remains effective so we will stay on that.  The Xanax helps her sleep, and that is very important for her.  If she does not sleep that tends to be one of the signs that a manic episode is coming on.  And my best medical judgment we should continue the Xanax in the evening and the Adderall during the day as prescribed now.  She has been psychotic several different times in her life and we are trying to prevent that by helping her sleep well.  She understands and agrees with this plan.  We again discussed the Lybalvi.  She may be going through a major insurance change soon, her husband will be retiring, so we agreed to leave the Seroquel the same for now but she'll call if/when ready to change to Lybalvi once she knows more  about her insurance.  Benefits, risks, and side effects were discussed.  Continue Xanax 1 mg, 1/2-1 po q hs prn sleep.  Continue Adderall 10 mg, 1 po bid.  Continue Lamictal 200 mg, 1 p.o. daily. Continue Paxil 40 mg, 1 p.o. daily.   Continue Seroquel 400 mg, 2 po qhs.  Continue multivitamin, B12, and vitamin D. Continue therapy. Return in 3 months.  [name], PA-C

## 2023-01-07 DIAGNOSIS — M5441 Lumbago with sciatica, right side: Secondary | ICD-10-CM | POA: Diagnosis not present

## 2023-01-12 DIAGNOSIS — E039 Hypothyroidism, unspecified: Secondary | ICD-10-CM | POA: Diagnosis not present

## 2023-01-14 DIAGNOSIS — M5441 Lumbago with sciatica, right side: Secondary | ICD-10-CM | POA: Diagnosis not present

## 2023-01-23 DIAGNOSIS — E785 Hyperlipidemia, unspecified: Secondary | ICD-10-CM | POA: Diagnosis not present

## 2023-01-23 DIAGNOSIS — R944 Abnormal results of kidney function studies: Secondary | ICD-10-CM | POA: Diagnosis not present

## 2023-01-23 DIAGNOSIS — R7303 Prediabetes: Secondary | ICD-10-CM | POA: Diagnosis not present

## 2023-01-23 DIAGNOSIS — Z79899 Other long term (current) drug therapy: Secondary | ICD-10-CM | POA: Diagnosis not present

## 2023-02-01 ENCOUNTER — Other Ambulatory Visit: Payer: Self-pay | Admitting: Physician Assistant

## 2023-02-25 DIAGNOSIS — E785 Hyperlipidemia, unspecified: Secondary | ICD-10-CM | POA: Diagnosis not present

## 2023-02-25 DIAGNOSIS — E039 Hypothyroidism, unspecified: Secondary | ICD-10-CM | POA: Diagnosis not present

## 2023-02-25 DIAGNOSIS — R7989 Other specified abnormal findings of blood chemistry: Secondary | ICD-10-CM | POA: Diagnosis not present

## 2023-02-25 DIAGNOSIS — N1832 Chronic kidney disease, stage 3b: Secondary | ICD-10-CM | POA: Diagnosis not present

## 2023-02-26 DIAGNOSIS — E039 Hypothyroidism, unspecified: Secondary | ICD-10-CM | POA: Diagnosis not present

## 2023-02-27 ENCOUNTER — Other Ambulatory Visit: Payer: Self-pay | Admitting: Nephrology

## 2023-02-27 DIAGNOSIS — R7989 Other specified abnormal findings of blood chemistry: Secondary | ICD-10-CM

## 2023-02-27 DIAGNOSIS — F319 Bipolar disorder, unspecified: Secondary | ICD-10-CM

## 2023-03-10 ENCOUNTER — Other Ambulatory Visit: Payer: Self-pay | Admitting: Nephrology

## 2023-03-10 DIAGNOSIS — F319 Bipolar disorder, unspecified: Secondary | ICD-10-CM

## 2023-03-10 DIAGNOSIS — R7989 Other specified abnormal findings of blood chemistry: Secondary | ICD-10-CM

## 2023-03-11 DIAGNOSIS — L723 Sebaceous cyst: Secondary | ICD-10-CM | POA: Diagnosis not present

## 2023-03-19 ENCOUNTER — Ambulatory Visit
Admission: RE | Admit: 2023-03-19 | Discharge: 2023-03-19 | Disposition: A | Discharge status: Home / Self Care | Payer: BC Managed Care – PPO | Source: Ambulatory Visit | Attending: Obstetrics and Gynecology | Admitting: Obstetrics and Gynecology

## 2023-03-19 DIAGNOSIS — E349 Endocrine disorder, unspecified: Secondary | ICD-10-CM | POA: Diagnosis not present

## 2023-03-19 DIAGNOSIS — M810 Age-related osteoporosis without current pathological fracture: Secondary | ICD-10-CM

## 2023-03-19 DIAGNOSIS — N958 Other specified menopausal and perimenopausal disorders: Secondary | ICD-10-CM | POA: Diagnosis not present

## 2023-03-19 DIAGNOSIS — M8588 Other specified disorders of bone density and structure, other site: Secondary | ICD-10-CM | POA: Diagnosis not present

## 2023-03-20 ENCOUNTER — Ambulatory Visit
Admission: RE | Admit: 2023-03-20 | Discharge: 2023-03-20 | Disposition: A | Discharge status: Home / Self Care | Payer: BC Managed Care – PPO | Source: Ambulatory Visit | Attending: Nephrology

## 2023-03-20 DIAGNOSIS — F319 Bipolar disorder, unspecified: Secondary | ICD-10-CM

## 2023-03-20 DIAGNOSIS — R7989 Other specified abnormal findings of blood chemistry: Secondary | ICD-10-CM | POA: Diagnosis not present

## 2023-03-22 ENCOUNTER — Other Ambulatory Visit: Payer: Self-pay | Admitting: Physician Assistant

## 2023-03-31 ENCOUNTER — Ambulatory Visit (INDEPENDENT_AMBULATORY_CARE_PROVIDER_SITE_OTHER): Payer: Self-pay | Admitting: Physician Assistant

## 2023-03-31 DIAGNOSIS — Z91199 Patient's noncompliance with other medical treatment and regimen due to unspecified reason: Secondary | ICD-10-CM

## 2023-03-31 NOTE — Progress Notes (Signed)
No show

## 2023-04-02 DIAGNOSIS — E039 Hypothyroidism, unspecified: Secondary | ICD-10-CM | POA: Diagnosis not present

## 2023-04-08 DIAGNOSIS — E669 Obesity, unspecified: Secondary | ICD-10-CM | POA: Diagnosis not present

## 2023-04-08 DIAGNOSIS — E785 Hyperlipidemia, unspecified: Secondary | ICD-10-CM | POA: Diagnosis not present

## 2023-04-08 DIAGNOSIS — E039 Hypothyroidism, unspecified: Secondary | ICD-10-CM | POA: Diagnosis not present

## 2023-04-08 DIAGNOSIS — N1832 Chronic kidney disease, stage 3b: Secondary | ICD-10-CM | POA: Diagnosis not present

## 2023-04-10 ENCOUNTER — Encounter: Payer: Self-pay | Admitting: Physician Assistant

## 2023-04-10 ENCOUNTER — Ambulatory Visit: Payer: BC Managed Care – PPO | Admitting: Physician Assistant

## 2023-04-10 DIAGNOSIS — F429 Obsessive-compulsive disorder, unspecified: Secondary | ICD-10-CM | POA: Diagnosis not present

## 2023-04-10 DIAGNOSIS — F319 Bipolar disorder, unspecified: Secondary | ICD-10-CM | POA: Diagnosis not present

## 2023-04-10 DIAGNOSIS — F411 Generalized anxiety disorder: Secondary | ICD-10-CM

## 2023-04-10 DIAGNOSIS — G47419 Narcolepsy without cataplexy: Secondary | ICD-10-CM

## 2023-04-10 DIAGNOSIS — G4700 Insomnia, unspecified: Secondary | ICD-10-CM

## 2023-04-10 DIAGNOSIS — F900 Attention-deficit hyperactivity disorder, predominantly inattentive type: Secondary | ICD-10-CM

## 2023-04-10 MED ORDER — AMPHETAMINE-DEXTROAMPHETAMINE 10 MG PO TABS: 10.0000 mg | ORAL_TABLET | Freq: Two times a day (BID) | ORAL | 0 refills | Status: DC

## 2023-04-10 NOTE — Progress Notes (Signed)
[facility]  Patient ID: Lindsay [name],  MRN: [id number]  PCP: Aliene Beams, MD  Date of Evaluation: 04/10/2023 Time spent:20 minutes  Chief Complaint:  Chief Complaint   Anxiety; Depression; Insomnia; Follow-up    HISTORY/CURRENT STATUS: HPI For routine med check.  She's stable with meds. Patient is able to enjoy things.  Energy and motivation are good.  She exercises by walking or jogging every day.  She is still trying to lose weight but states she is stuck around 200 pounds.  No extreme sadness, tearfulness, or feelings of hopelessness.  Sleeps well, she takes the Xanax almost every night to help her relax so she can drift off to sleep.  Otherwise her mind races.  She will have ruminating thoughts, no compulsions though.  The Xanax helps treat the obsessive thoughts.  ADLs and personal hygiene are normal.   Appetite has not changed.  Weight is stable.  Anxiety is controlled. Doesn't have panic attacks.  Denies suicidal or homicidal thoughts.  Patient denies increased energy with decreased need for sleep, increased talkativeness, racing thoughts, impulsivity or risky behaviors, increased spending, increased libido, grandiosity, increased irritability or anger, paranoia, or hallucinations.  Denies dizziness, syncope, seizures, numbness, tingling, tremor, tics, unsteady gait, slurred speech, confusion. Denies muscle or joint pain, stiffness, or dystonia. Denies unexplained weight loss, frequent infections, or sores that heal slowly.  No polyphagia, polydipsia, or polyuria. Denies visual changes or paresthesias.   Individual Medical History/ Review of Systems: Changes? :No     Past medications for mental health diagnoses include: Geodon, Lamictal, Latuda, Paxil, trazodone, Ambien, Seroquel, Vraylar, Risperdal, Saphris, Depakote, Fanapt, Abilify, Rexulti, Wellbutrin, Provigil, Nuvigil, Vyvanse, Adderall (stopped when she was hospitalized for psychosis 02/2021) Strattera  reportedly made her blood pressure drop.  Metformin for weight gain caused GI side effects.  Hospitalization 02/2021-for psychosis  ECT  Allergies: Strattera [atomoxetine]  Current Medications:  Current Outpatient Medications:    ALPRAZolam (XANAX) 1 MG tablet, TAKE 1/2 TO 1 TABLET(0.5 TO 1 MG) BY MOUTH THREE TIMES DAILY AS NEEDED FOR ANXIETY, Disp: 45 tablet, Rfl: 5   estradiol (ESTRACE) 0.5 MG tablet, Take 0.5 tablets by mouth daily., Disp: , Rfl:    lamoTRIgine (LAMICTAL) 200 MG tablet, TAKE 1 TABLET(200 MG) BY MOUTH DAILY, Disp: 90 tablet, Rfl: 3   levothyroxine (SYNTHROID) 137 MCG tablet, Take 137 mcg by mouth daily before breakfast., Disp: , Rfl:    PARoxetine (PAXIL) 40 MG tablet, Take 1 tablet (40 mg total) by mouth every morning., Disp: 90 tablet, Rfl: 3   progesterone (PROMETRIUM) 200 MG capsule, Take 200 mg by mouth daily., Disp: , Rfl:    QUEtiapine (SEROQUEL) 400 MG tablet, TAKE 2 TABLETS(800 MG) BY MOUTH AT BEDTIME., Disp: 180 tablet, Rfl: 1   rosuvastatin (CRESTOR) 10 MG tablet, Take 10 mg by mouth daily., Disp: , Rfl:    [START ON 04/21/2023] amphetamine-dextroamphetamine (ADDERALL) 10 MG tablet, Take 1 tablet (10 mg total) by mouth 2 (two) times daily with breakfast and lunch., Disp: 60 tablet, Rfl: 0   [START ON 05/21/2023] amphetamine-dextroamphetamine (ADDERALL) 10 MG tablet, Take 1 tablet (10 mg total) by mouth 2 (two) times daily with breakfast and lunch., Disp: 60 tablet, Rfl: 0   [START ON 06/19/2023] amphetamine-dextroamphetamine (ADDERALL) 10 MG tablet, Take 1 tablet (10 mg total) by mouth 2 (two) times daily with breakfast and lunch., Disp: 60 tablet, Rfl: 0   Cholecalciferol 125 MCG (5000 UT) capsule, Take 5,000 Units by mouth daily. (Patient not taking: Reported  on 04/10/2023), Disp: , Rfl:    liothyronine (CYTOMEL) 5 MCG tablet, Take 5 mcg by mouth daily. (Patient not taking: Reported on 04/10/2023), Disp: , Rfl:    simvastatin (ZOCOR) 20 MG tablet, Take 20 mg by  mouth at bedtime. (Patient not taking: Reported on 12/30/2022), Disp: , Rfl:    vitamin B-12 (CYANOCOBALAMIN) 1000 MCG tablet, Take 1,000 mcg by mouth daily. (Patient not taking: Reported on 04/10/2023), Disp: , Rfl:  Medication Side Effects: weight gain  Family Medical/ Social History: Changes? No  MENTAL HEALTH EXAM:  There were no vitals taken for this visit.There is no height or weight on file to calculate BMI.    General Appearance: Casual, Well Groomed, Obese, and lots of make-up on   Eye Contact:  Good  Speech:  Clear and Coherent and Normal Rate  Volume:  Normal  Mood:  Euthymic  Affect:  Congruent  Thought Process:  Goal Directed and Descriptions of Associations: Circumstantial  Orientation:  Full (Time, Place, and Person)  Thought Content: Logical   Suicidal Thoughts:  No  Homicidal Thoughts:  No  Memory:  WNL  Judgement:  Good  Insight:  Good  Psychomotor Activity:  Normal  Concentration:  Concentration: Good and Attention Span: Good  Recall:  Fair  Fund of Knowledge: Good  Language: Good  Assets:  Communication Skills Desire for Improvement Financial Resources/Insurance Housing Physical Health Resilience Transportation  ADL's:  Intact  Cognition: WNL  Prognosis:  Good   PCP follows labs.  DIAGNOSES:    ICD-10-CM   1. Bipolar I disorder (HCC)  F31.9     2. 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 Attention deficit hyperactivity disorder (ADHD), predominantly inattentive type  F90.0     4. Generalized anxiety disorder  F41.1     5. Insomnia, unspecified type  G47.00     6. 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  Last Xanax filled 04/05/2023.  Adderall filled 03/23/2023. I provided 20 minutes of face to face time during this encounter, including time spent before and after the visit in records review, medical decision making,  counseling pertinent to today's visit, and charting.   Lindsay [name] is doing well on the current treatment so no changes need to be made.  Continue Xanax 1 mg, 1/2-1 po q hs prn sleep.  Continue Adderall 10 mg, 1 po bid.  Continue Lamictal 200 mg, 1 p.o. daily. Continue Paxil 40 mg, 1 p.o. daily.   Continue Seroquel 400 mg, 2 po qhs.  Continue multivitamin, B12, and vitamin D. Continue therapy. Return in 3 months.  [name], PA-C

## 2023-04-15 DIAGNOSIS — L821 Other seborrheic keratosis: Secondary | ICD-10-CM | POA: Diagnosis not present

## 2023-04-15 DIAGNOSIS — L578 Other skin changes due to chronic exposure to nonionizing radiation: Secondary | ICD-10-CM | POA: Diagnosis not present

## 2023-04-15 DIAGNOSIS — L814 Other melanin hyperpigmentation: Secondary | ICD-10-CM | POA: Diagnosis not present

## 2023-04-15 DIAGNOSIS — L820 Inflamed seborrheic keratosis: Secondary | ICD-10-CM | POA: Diagnosis not present

## 2023-04-15 DIAGNOSIS — D1801 Hemangioma of skin and subcutaneous tissue: Secondary | ICD-10-CM | POA: Diagnosis not present

## 2023-05-11 DIAGNOSIS — E039 Hypothyroidism, unspecified: Secondary | ICD-10-CM | POA: Diagnosis not present

## 2023-05-14 DIAGNOSIS — E049 Nontoxic goiter, unspecified: Secondary | ICD-10-CM | POA: Diagnosis not present

## 2023-05-14 DIAGNOSIS — E039 Hypothyroidism, unspecified: Secondary | ICD-10-CM | POA: Diagnosis not present

## 2023-07-03 ENCOUNTER — Other Ambulatory Visit: Payer: Self-pay | Admitting: Physician Assistant

## 2023-07-07 ENCOUNTER — Telehealth: Payer: Self-pay | Admitting: Physician Assistant

## 2023-07-07 NOTE — Telephone Encounter (Signed)
 Lindsay [name] called at 3:00 to request that her prescriptions for Seroquel  and Xanax  be senet to a new pharmacy [facility] on [address] and [address].  She got only a partial fill of the Seroquel  at the other [facility], only #10.  They said they would get the rest but they haven't.  She is switching pharmacy due to all the mix up.

## 2023-07-08 ENCOUNTER — Other Ambulatory Visit: Payer: Self-pay | Admitting: Physician Assistant

## 2023-07-08 MED ORDER — QUETIAPINE FUMARATE 400 MG PO TABS: 800.0000 mg | ORAL_TABLET | Freq: Every day | ORAL | 0 refills | Status: DC

## 2023-07-08 NOTE — Telephone Encounter (Signed)
 Yes, you're right. Should last 30 days.

## 2023-07-08 NOTE — Addendum Note (Signed)
 Addended by: Karin Lieu T on: 07/08/2023 10:25 AM   Modules accepted: Orders

## 2023-07-08 NOTE — Telephone Encounter (Signed)
 Patient is asking for a RF of her Xanax . I didn't know if Rx was supposed to last 30 days. Based in PMPD it looks like she has gotten it earlier than 30 days. Just wanted to verify.   Alprazolam  1 Mg Tablet 45.00 [address] [place] [id number] [facility] (5[facility]) 5/5 6.00 LME Comm Ins [place] 05/22/2023 12/30/2022 1  Alprazolam  1 Mg Tablet 45.00 [address] [place] [id number] [facility] (5343) 4/5 6.00 LME Comm Ins [place] 05/22/2023 04/10/2023 1  Dextroamp-Amphetamin 10 Mg Tab 60.00 [address] [place] [id number] [facility] (5343) 0/0  Comm Ins [place] 04/29/2023 12/30/2022 1  Alprazolam  1 Mg Tablet 45.00 [address] [place] [id number] [facility] (5343) 3/5 6.00 LME Comm Ins [place] 04/22/2023 04/10/2023 1  Dextroamp-Amphetamin 10 Mg Tab 60.00 [address] [place] [id number] [facility] (5343) 0/0  Comm Ins [place] 04/05/2023 12/30/2022 1  Alprazolam  1 Mg Tablet

## 2023-07-08 NOTE — Telephone Encounter (Signed)
 Seroquel sent to requested pharmacy. Too early for Xanax.

## 2023-07-10 ENCOUNTER — Other Ambulatory Visit: Payer: Self-pay

## 2023-07-10 NOTE — Telephone Encounter (Signed)
 Hydie lvm today at 11:13 stating per [facility] they didn't receive the ok for the Rx on the Xanax . She voiced her dislike of [facility], and for our office to resend the Rx please.   [id number] [place], [place] - [address] AT [facility] [address] & [facility] [address] [place], [place] [address] Phone: [phone]  Fax: [phone]

## 2023-07-14 ENCOUNTER — Encounter: Payer: Self-pay | Admitting: Physician Assistant

## 2023-07-14 ENCOUNTER — Ambulatory Visit (INDEPENDENT_AMBULATORY_CARE_PROVIDER_SITE_OTHER): Payer: BC Managed Care – PPO | Admitting: Physician Assistant

## 2023-07-14 DIAGNOSIS — F429 Obsessive-compulsive disorder, unspecified: Secondary | ICD-10-CM | POA: Diagnosis not present

## 2023-07-14 DIAGNOSIS — F319 Bipolar disorder, unspecified: Secondary | ICD-10-CM

## 2023-07-14 DIAGNOSIS — F900 Attention-deficit hyperactivity disorder, predominantly inattentive type: Secondary | ICD-10-CM

## 2023-07-14 DIAGNOSIS — F411 Generalized anxiety disorder: Secondary | ICD-10-CM | POA: Diagnosis not present

## 2023-07-14 MED ORDER — AMPHETAMINE-DEXTROAMPHETAMINE 10 MG PO TABS: 10.0000 mg | ORAL_TABLET | Freq: Two times a day (BID) | ORAL | 0 refills | Status: DC

## 2023-07-14 MED ORDER — ALPRAZOLAM 1 MG PO TABS: ORAL_TABLET | ORAL | 5 refills | Status: DC

## 2023-07-14 MED ORDER — PAROXETINE HCL 40 MG PO TABS: 40.0000 mg | ORAL_TABLET | ORAL | 3 refills | Status: DC

## 2023-07-14 MED ORDER — QUETIAPINE FUMARATE 400 MG PO TABS: 800.0000 mg | ORAL_TABLET | Freq: Every day | ORAL | 3 refills | Status: DC

## 2023-07-14 MED ORDER — ALPRAZOLAM 1 MG PO TABS: ORAL_TABLET | ORAL | Status: DC

## 2023-07-14 NOTE — Progress Notes (Signed)
 [facility]  Patient ID: Lindsay [name],  MRN: [id number]  PCP: Rolinda Millman, MD  Date of Evaluation: 07/14/2023 Time spent:20 minutes  Chief Complaint:  Chief Complaint   ADHD; Anxiety; Insomnia; Depression; Follow-up    HISTORY/CURRENT STATUS: HPI For routine med check.  More anxious. Dtr is in jail, she and her husband are keeping her daughters. That's been very stressful.  Anxiety is worse during the afternoon or even when she's walking, which she does almost everyday. Feels panicky sometimes. The Xanax  helps but she's usually only at night.   Patient is able to enjoy things.  Energy and motivation are good.   No extreme sadness, tearfulness, or feelings of hopelessness.  Sleeps well most of the time. ADLs and personal hygiene are normal.  Appetite has not changed.  Weight is stable.   Denies suicidal or homicidal thoughts.  Patient denies increased energy with decreased need for sleep, increased talkativeness, racing thoughts, impulsivity or risky behaviors, increased spending, increased libido, grandiosity, increased irritability or anger, paranoia, or hallucinations.  Denies dizziness, syncope, seizures, numbness, tingling, tremor, tics, unsteady gait, slurred speech, confusion. Denies muscle or joint pain, stiffness, or dystonia. Denies unexplained weight loss, frequent infections, or sores that heal slowly.  No polyphagia, polydipsia, or polyuria. Denies visual changes or paresthesias.   Individual Medical History/ Review of Systems: Changes? :No     Past medications for mental health diagnoses include: Geodon , Lamictal , Latuda, Paxil , trazodone , Ambien , Seroquel , Vraylar, Risperdal, Saphris, Depakote, Fanapt, Abilify, Rexulti, Wellbutrin, Provigil, Nuvigil, Vyvanse , Adderall (stopped when she was hospitalized for psychosis 02/2021) Strattera  reportedly made her blood pressure drop.  Metformin  for weight gain caused GI side effects.  Hospitalization 02/2021-for  psychosis  ECT  Allergies: Strattera  [atomoxetine ]  Current Medications:  Current Outpatient Medications:    Cholecalciferol 125 MCG (5000 UT) capsule, Take 5,000 Units by mouth daily., Disp: , Rfl:    estradiol  (ESTRACE ) 0.5 MG tablet, Take 0.5 tablets by mouth daily., Disp: , Rfl:    lamoTRIgine  (LAMICTAL ) 200 MG tablet, TAKE 1 TABLET(200 MG) BY MOUTH DAILY, Disp: 90 tablet, Rfl: 3   levothyroxine  (SYNTHROID ) 137 MCG tablet, Take 137 mcg by mouth daily before breakfast., Disp: , Rfl:    progesterone  (PROMETRIUM ) 200 MG capsule, Take 200 mg by mouth daily., Disp: , Rfl:    rosuvastatin (CRESTOR) 10 MG tablet, Take 10 mg by mouth daily., Disp: , Rfl:    ALPRAZolam  (XANAX ) 1 MG tablet, 1/2 po in the late afternoon prn, and 1 po at bedtime prn., Disp: 45 tablet, Rfl: 5   amphetamine -dextroamphetamine  (ADDERALL) 10 MG tablet, Take 1 tablet (10 mg total) by mouth 2 (two) times daily with breakfast and lunch., Disp: 60 tablet, Rfl: 0   [START ON 08/18/2023] amphetamine -dextroamphetamine  (ADDERALL) 10 MG tablet, Take 1 tablet (10 mg total) by mouth 2 (two) times daily with breakfast and lunch., Disp: 60 tablet, Rfl: 0   [START ON 09/14/2023] amphetamine -dextroamphetamine  (ADDERALL) 10 MG tablet, Take 1 tablet (10 mg total) by mouth 2 (two) times daily with breakfast and lunch., Disp: 60 tablet, Rfl: 0   liothyronine  (CYTOMEL ) 5 MCG tablet, Take 5 mcg by mouth daily. (Patient not taking: Reported on 04/10/2023), Disp: , Rfl:    PARoxetine  (PAXIL ) 40 MG tablet, Take 1 tablet (40 mg total) by mouth every morning., Disp: 90 tablet, Rfl: 3   QUEtiapine  (SEROQUEL ) 400 MG tablet, Take 2 tablets (800 mg total) by mouth at bedtime., Disp: 180 tablet, Rfl: 3   simvastatin  (ZOCOR ) 20  MG tablet, Take 20 mg by mouth at bedtime. (Patient not taking: Reported on 12/30/2022), Disp: , Rfl:    vitamin B-12 (CYANOCOBALAMIN ) 1000 MCG tablet, Take 1,000 mcg by mouth daily. (Patient not taking: Reported on 04/10/2023), Disp:  , Rfl:  Medication Side Effects: weight gain  Family Medical/ Social History: Changes? Her daughter is in jail. She's got 2 granddtrs living with her and husband, both teenagers.  MENTAL HEALTH EXAM:  There were no vitals taken for this visit.There is no height or weight on file to calculate BMI.    General Appearance: Casual, Well Groomed, Obese, and lots of make-up on   Eye Contact:  Good  Speech:  Clear and Coherent and Normal Rate  Volume:  Normal  Mood:  Euthymic  Affect:  Congruent  Thought Process:  Goal Directed and Descriptions of Associations: Circumstantial  Orientation:  Full (Time, Place, and Person)  Thought Content: Logical   Suicidal Thoughts:  No  Homicidal Thoughts:  No  Memory:  WNL  Judgement:  Good  Insight:  Good  Psychomotor Activity:  Normal  Concentration:  Concentration: Good and Attention Span: Good  Recall:  Fair  Fund of Knowledge: Good  Language: Good  Assets:  Desire for Improvement Financial Resources/Insurance Housing Physical Health Resilience Transportation  ADL's:  Intact  Cognition: WNL  Prognosis:  Good   PCP follows labs.  DIAGNOSES:    ICD-10-CM   1. Bipolar I disorder (HCC)  F31.9     2. 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 Attention deficit hyperactivity disorder (ADHD), predominantly inattentive type  F90.0     4. Generalized anxiety disorder  F41.1       Receiving Psychotherapy: Yes   [name]  RECOMMENDATIONS:  PDMP was reviewed.  Last Xanax  filled 07/10/2023.  Adderall filled 06/19/2023. I provided 20  minutes of face to face time during this encounter, including time spent before and after the visit in records review, medical decision making, counseling pertinent to today's visit, and charting.   She's stable as far as meds go so no changes needed.   Due to her sx and situation, I see it necessary to Rx a BZ and stimulant. She knows not to take near the same time. One is  an upper and the other a downer. She understands.   Continue Xanax  1 mg, 1/2-1 po q hs prn sleep. Take as sparingly as possible.  Continue Adderall 10 mg, 1 po bid.  Continue Lamictal  200 mg, 1 p.o. daily. Continue Paxil  40 mg, 1 p.o. daily.   Continue Seroquel  400 mg, 2 po qhs.  Continue multivitamin, B12, and vitamin D. Continue therapy. Return in 3 months.  Verneita Cooks, PA-C

## 2023-07-17 ENCOUNTER — Other Ambulatory Visit: Payer: Self-pay | Admitting: Physician Assistant

## 2023-07-19 ENCOUNTER — Encounter: Payer: Self-pay | Admitting: Physician Assistant

## 2023-07-21 ENCOUNTER — Telehealth: Payer: Self-pay | Admitting: Physician Assistant

## 2023-07-21 NOTE — Telephone Encounter (Signed)
She thinks she has akathisia due to the increase of the Seroquel 800 mg daily (about a year ago when she was in [place]). She thought it was her ADHD, but since being sick she thinks its its due to the seroquel 800 Mg. She states it has been going for about a year but has forgotten to mention it because she was just equating it to her adhd. She said she is feeling this "inside anxiety that is just wanting to get out of her own body" she can't sit still or lay down.  She would like to know if there's anything that can be done.

## 2023-07-21 NOTE — Telephone Encounter (Signed)
Lindsay [name] called and left message that she is having side effects from the Seroquel.  Please call to discuss.  Next appt 4/14

## 2023-07-22 ENCOUNTER — Other Ambulatory Visit: Payer: Self-pay

## 2023-07-22 ENCOUNTER — Other Ambulatory Visit: Payer: Self-pay | Admitting: Physician Assistant

## 2023-07-22 DIAGNOSIS — J0110 Acute frontal sinusitis, unspecified: Secondary | ICD-10-CM | POA: Diagnosis not present

## 2023-07-22 MED ORDER — MIRTAZAPINE 7.5 MG PO TABS: 7.5000 mg | ORAL_TABLET | Freq: Every day | ORAL | 1 refills | Status: DC

## 2023-07-22 NOTE — Telephone Encounter (Signed)
I would recommend adding mirtazapine.  She would take it at night because it causes sedation, we give it for insomnia and akathisia.  Unfortunately this can cause increased hunger in some people and lead to weight gain.  She definitely needs to add vitamin B6.  See how she feels about this medication and we can add it on and recheck her in a month to 6 weeks.  There are a few other medication possibilities but this is my "go to" recommendation and I do not want to try anything else until we see how she feels about this drug.  Thanks.

## 2023-07-22 NOTE — Telephone Encounter (Signed)
She said she was interested int he Mirtazapine. I pended it to you.  I also let her know to add vitamin b6, she understood.

## 2023-07-22 NOTE — Telephone Encounter (Signed)
Noted, thanks!

## 2023-08-10 ENCOUNTER — Other Ambulatory Visit: Payer: Self-pay | Admitting: Physician Assistant

## 2023-08-10 ENCOUNTER — Other Ambulatory Visit: Payer: Self-pay

## 2023-08-11 MED ORDER — ALPRAZOLAM 1 MG PO TABS: ORAL_TABLET | ORAL | 5 refills | Status: DC

## 2023-08-31 DIAGNOSIS — N1831 Chronic kidney disease, stage 3a: Secondary | ICD-10-CM | POA: Diagnosis not present

## 2023-08-31 DIAGNOSIS — E785 Hyperlipidemia, unspecified: Secondary | ICD-10-CM | POA: Diagnosis not present

## 2023-08-31 DIAGNOSIS — N3000 Acute cystitis without hematuria: Secondary | ICD-10-CM | POA: Diagnosis not present

## 2023-08-31 DIAGNOSIS — Z0000 Encounter for general adult medical examination without abnormal findings: Secondary | ICD-10-CM | POA: Diagnosis not present

## 2023-08-31 DIAGNOSIS — R3915 Urgency of urination: Secondary | ICD-10-CM | POA: Diagnosis not present

## 2023-09-14 DIAGNOSIS — Z1231 Encounter for screening mammogram for malignant neoplasm of breast: Secondary | ICD-10-CM | POA: Diagnosis not present

## 2023-09-21 ENCOUNTER — Telehealth: Payer: Self-pay | Admitting: Physician Assistant

## 2023-09-21 NOTE — Telephone Encounter (Signed)
 Syanne called and LM re: changing 30 dy to a 90 day RX on  Remeron 7.5mg  due to her insurance. [facility] [address]

## 2023-09-22 MED ORDER — MIRTAZAPINE 7.5 MG PO TABS: 7.5000 mg | ORAL_TABLET | Freq: Every day | ORAL | 0 refills | Status: DC

## 2023-09-22 NOTE — Telephone Encounter (Signed)
 Rx for 90 day supply Remeron sent to requested pharmacy

## 2023-10-07 DIAGNOSIS — L814 Other melanin hyperpigmentation: Secondary | ICD-10-CM | POA: Diagnosis not present

## 2023-10-07 DIAGNOSIS — D485 Neoplasm of uncertain behavior of skin: Secondary | ICD-10-CM | POA: Diagnosis not present

## 2023-10-07 DIAGNOSIS — D229 Melanocytic nevi, unspecified: Secondary | ICD-10-CM | POA: Diagnosis not present

## 2023-10-07 DIAGNOSIS — L578 Other skin changes due to chronic exposure to nonionizing radiation: Secondary | ICD-10-CM | POA: Diagnosis not present

## 2023-10-07 DIAGNOSIS — L570 Actinic keratosis: Secondary | ICD-10-CM | POA: Diagnosis not present

## 2023-10-07 DIAGNOSIS — L820 Inflamed seborrheic keratosis: Secondary | ICD-10-CM | POA: Diagnosis not present

## 2023-10-07 DIAGNOSIS — L439 Lichen planus, unspecified: Secondary | ICD-10-CM | POA: Diagnosis not present

## 2023-10-07 DIAGNOSIS — L821 Other seborrheic keratosis: Secondary | ICD-10-CM | POA: Diagnosis not present

## 2023-10-08 DIAGNOSIS — E785 Hyperlipidemia, unspecified: Secondary | ICD-10-CM | POA: Diagnosis not present

## 2023-10-08 DIAGNOSIS — N1832 Chronic kidney disease, stage 3b: Secondary | ICD-10-CM | POA: Diagnosis not present

## 2023-10-08 DIAGNOSIS — E669 Obesity, unspecified: Secondary | ICD-10-CM | POA: Diagnosis not present

## 2023-10-08 DIAGNOSIS — E039 Hypothyroidism, unspecified: Secondary | ICD-10-CM | POA: Diagnosis not present

## 2023-10-12 ENCOUNTER — Ambulatory Visit (INDEPENDENT_AMBULATORY_CARE_PROVIDER_SITE_OTHER): Payer: BC Managed Care – PPO | Admitting: Physician Assistant

## 2023-10-12 DIAGNOSIS — Z91199 Patient's noncompliance with other medical treatment and regimen due to unspecified reason: Secondary | ICD-10-CM

## 2023-10-12 NOTE — Progress Notes (Signed)
 No show

## 2023-10-21 DIAGNOSIS — Z760 Encounter for issue of repeat prescription: Secondary | ICD-10-CM | POA: Diagnosis not present

## 2023-10-21 DIAGNOSIS — K5909 Other constipation: Secondary | ICD-10-CM | POA: Diagnosis not present

## 2023-10-21 DIAGNOSIS — R87610 Atypical squamous cells of undetermined significance on cytologic smear of cervix (ASC-US): Secondary | ICD-10-CM | POA: Diagnosis not present

## 2023-10-21 DIAGNOSIS — Z01419 Encounter for gynecological examination (general) (routine) without abnormal findings: Secondary | ICD-10-CM | POA: Diagnosis not present

## 2023-10-27 ENCOUNTER — Other Ambulatory Visit: Payer: Self-pay

## 2023-10-27 ENCOUNTER — Telehealth: Payer: Self-pay | Admitting: Physician Assistant

## 2023-10-27 MED ORDER — AMPHETAMINE-DEXTROAMPHETAMINE 10 MG PO TABS: 10.0000 mg | ORAL_TABLET | Freq: Two times a day (BID) | ORAL | 0 refills | Status: DC

## 2023-10-27 NOTE — Telephone Encounter (Signed)
 Pended Adderall #60 to [facility]

## 2023-10-27 NOTE — Telephone Encounter (Signed)
 PT lvm that she needs a refill on her adderall 10 mg,. [facility] is [facility] on [address]

## 2023-11-18 ENCOUNTER — Encounter: Payer: Self-pay | Admitting: Nurse Practitioner

## 2023-11-18 DIAGNOSIS — K5900 Constipation, unspecified: Secondary | ICD-10-CM | POA: Diagnosis not present

## 2023-11-24 ENCOUNTER — Telehealth: Payer: Self-pay | Admitting: Physician Assistant

## 2023-11-24 NOTE — Telephone Encounter (Signed)
 Pt called at 10:02a for refill of Adderall to    [facility] #[id number] [place], [place] - [address] AT [facility] OF [address] & [facility] [address] [place] [address] Phone: [phone]  Fax: [phone]    Next appt 6/18

## 2023-11-24 NOTE — Telephone Encounter (Signed)
 LF 4/29, due 5/28

## 2023-11-25 ENCOUNTER — Other Ambulatory Visit: Payer: Self-pay

## 2023-11-25 MED ORDER — AMPHETAMINE-DEXTROAMPHETAMINE 10 MG PO TABS: 10.0000 mg | ORAL_TABLET | Freq: Two times a day (BID) | ORAL | 0 refills | Status: DC

## 2023-11-25 NOTE — Telephone Encounter (Signed)
 Pended.

## 2023-12-11 DIAGNOSIS — S76312A Strain of muscle, fascia and tendon of the posterior muscle group at thigh level, left thigh, initial encounter: Secondary | ICD-10-CM | POA: Diagnosis not present

## 2023-12-16 ENCOUNTER — Ambulatory Visit
Admission: RE | Admit: 2023-12-16 | Discharge: 2023-12-16 | Disposition: A | Discharge status: Home / Self Care | Source: Ambulatory Visit | Attending: Nurse Practitioner | Admitting: Nurse Practitioner

## 2023-12-16 ENCOUNTER — Other Ambulatory Visit: Payer: Self-pay | Admitting: Nurse Practitioner

## 2023-12-16 ENCOUNTER — Ambulatory Visit: Admitting: Physician Assistant

## 2023-12-16 DIAGNOSIS — K5900 Constipation, unspecified: Secondary | ICD-10-CM

## 2023-12-17 DIAGNOSIS — L820 Inflamed seborrheic keratosis: Secondary | ICD-10-CM | POA: Diagnosis not present

## 2023-12-17 DIAGNOSIS — D0462 Carcinoma in situ of skin of left upper limb, including shoulder: Secondary | ICD-10-CM | POA: Diagnosis not present

## 2023-12-17 DIAGNOSIS — D485 Neoplasm of uncertain behavior of skin: Secondary | ICD-10-CM | POA: Diagnosis not present

## 2023-12-17 DIAGNOSIS — L570 Actinic keratosis: Secondary | ICD-10-CM | POA: Diagnosis not present

## 2023-12-24 ENCOUNTER — Telehealth: Payer: Self-pay | Admitting: Physician Assistant

## 2023-12-24 ENCOUNTER — Other Ambulatory Visit: Payer: Self-pay

## 2023-12-24 MED ORDER — AMPHETAMINE-DEXTROAMPHETAMINE 10 MG PO TABS: 10.0000 mg | ORAL_TABLET | Freq: Two times a day (BID) | ORAL | 0 refills | Status: DC

## 2023-12-24 NOTE — Telephone Encounter (Signed)
 Patient lvm at 11:42 requesting refill for Adderall 10mg . PH: [phone] Appt 7/21 Pharmacy [facility] [address] [place], [place]

## 2023-12-24 NOTE — Telephone Encounter (Signed)
 Pended.

## 2024-01-06 DIAGNOSIS — E039 Hypothyroidism, unspecified: Secondary | ICD-10-CM | POA: Diagnosis not present

## 2024-01-06 DIAGNOSIS — Z01818 Encounter for other preprocedural examination: Secondary | ICD-10-CM | POA: Diagnosis not present

## 2024-01-18 ENCOUNTER — Encounter: Payer: Self-pay | Admitting: Physician Assistant

## 2024-01-18 ENCOUNTER — Ambulatory Visit: Admitting: Physician Assistant

## 2024-01-18 DIAGNOSIS — F411 Generalized anxiety disorder: Secondary | ICD-10-CM | POA: Diagnosis not present

## 2024-01-18 DIAGNOSIS — G47419 Narcolepsy without cataplexy: Secondary | ICD-10-CM

## 2024-01-18 DIAGNOSIS — F900 Attention-deficit hyperactivity disorder, predominantly inattentive type: Secondary | ICD-10-CM

## 2024-01-18 DIAGNOSIS — F319 Bipolar disorder, unspecified: Secondary | ICD-10-CM

## 2024-01-18 DIAGNOSIS — F429 Obsessive-compulsive disorder, unspecified: Secondary | ICD-10-CM

## 2024-01-18 DIAGNOSIS — Z630 Problems in relationship with spouse or partner: Secondary | ICD-10-CM

## 2024-01-18 MED ORDER — AMPHETAMINE-DEXTROAMPHETAMINE 10 MG PO TABS: 10.0000 mg | ORAL_TABLET | Freq: Two times a day (BID) | ORAL | 0 refills | Status: DC

## 2024-01-18 NOTE — Progress Notes (Signed)
 [facility]  Patient ID: Lindsay [name],  MRN: [id number]  PCP: Lindsay Millman, MD  Date of Evaluation: 01/18/2024 Time spent:20 minutes  Chief Complaint:  Chief Complaint   Anxiety; Depression; ADHD; Follow-up    HISTORY/CURRENT STATUS: HPI  3 month overdue for appt  Feels like her meds are working as they should.  Patient is able to enjoy things.  Energy and motivation are good.   No extreme sadness, tearfulness, or feelings of hopelessness.  Sleeps well.   ADLs and personal hygiene are normal.   Appetite has not changed.  Weight is stable.  States that attention is good without easy distractibility.  Able to focus on things and finish tasks to completion. Is a little anxious about her marriage issues but other than that, feels like the anxiety is controlled.  No sense of impending doom. Takes Xanax  when needed, usually in the late afternoon or at night prn.  She doesn't take it close to the time of Adderall.  She's been on this regimen for years.   No SI/HI.  Patient denies increased energy with decreased need for sleep, increased talkativeness, racing thoughts, impulsivity or risky behaviors, increased spending, increased libido, grandiosity, increased irritability or anger, paranoia, or hallucinations.  Having marital probs. States she's not going to stay with her husband. She feels ok with her decision, she's tried multiple times to stay with him. He's a narcissist.  No tachycardia, palpitations, SOB, sweating, or trembling. Denies dizziness, syncope, seizures, numbness, tingling, tremor, tics, unsteady gait, slurred speech, confusion.  Denies muscle or joint pain, stiffness, or dystonia.  Individual Medical History/ Review of Systems: Changes? :No     Past medications for mental health diagnoses include: Geodon , Lamictal , Latuda, Paxil , trazodone , Ambien , Seroquel , Vraylar, Risperdal, Saphris, Depakote, Fanapt, Abilify, Rexulti, Wellbutrin, Provigil, Nuvigil, Vyvanse ,  Adderall (stopped when she was hospitalized for psychosis 02/2021) Strattera  reportedly made her blood pressure drop.  Metformin  for weight gain caused GI side effects.  Hospitalization 02/2021-for psychosis  ECT  Allergies: Strattera  [atomoxetine ]  Current Medications:  Current Outpatient Medications:    ALPRAZolam  (XANAX ) 1 MG tablet, 1/2 po in the late afternoon prn, and 1 po at bedtime prn., Disp: 45 tablet, Rfl: 5   Cholecalciferol 125 MCG (5000 UT) capsule, Take 5,000 Units by mouth daily., Disp: , Rfl:    estradiol  (ESTRACE ) 0.5 MG tablet, Take 0.5 tablets by mouth daily., Disp: , Rfl:    lamoTRIgine  (LAMICTAL ) 200 MG tablet, TAKE 1 TABLET(200 MG) BY MOUTH DAILY, Disp: 90 tablet, Rfl: 3   levothyroxine  (SYNTHROID ) 137 MCG tablet, Take 137 mcg by mouth daily before breakfast., Disp: , Rfl:    PARoxetine  (PAXIL ) 40 MG tablet, Take 1 tablet (40 mg total) by mouth every morning., Disp: 90 tablet, Rfl: 3   progesterone  (PROMETRIUM ) 200 MG capsule, Take 200 mg by mouth daily., Disp: , Rfl:    QUEtiapine  (SEROQUEL ) 400 MG tablet, Take 2 tablets (800 mg total) by mouth at bedtime., Disp: 180 tablet, Rfl: 3   rosuvastatin (CRESTOR) 10 MG tablet, Take 10 mg by mouth daily., Disp: , Rfl:    [START ON 01/22/2024] amphetamine -dextroamphetamine  (ADDERALL) 10 MG tablet, Take 1 tablet (10 mg total) by mouth 2 (two) times daily with breakfast and lunch., Disp: 60 tablet, Rfl: 0   [START ON 02/21/2024] amphetamine -dextroamphetamine  (ADDERALL) 10 MG tablet, Take 1 tablet (10 mg total) by mouth 2 (two) times daily with breakfast and lunch., Disp: 60 tablet, Rfl: 0   [START ON 03/22/2024] amphetamine -dextroamphetamine  (ADDERALL)  10 MG tablet, Take 1 tablet (10 mg total) by mouth 2 (two) times daily with breakfast and lunch., Disp: 60 tablet, Rfl: 0   liothyronine  (CYTOMEL ) 5 MCG tablet, Take 5 mcg by mouth daily. (Patient not taking: Reported on 04/10/2023), Disp: , Rfl:    mirtazapine  (REMERON ) 7.5 MG tablet,  Take 1 tablet (7.5 mg total) by mouth at bedtime., Disp: 90 tablet, Rfl: 0   simvastatin  (ZOCOR ) 20 MG tablet, Take 20 mg by mouth at bedtime. (Patient not taking: Reported on 12/30/2022), Disp: , Rfl:    vitamin B-12 (CYANOCOBALAMIN ) 1000 MCG tablet, Take 1,000 mcg by mouth daily. (Patient not taking: Reported on 04/10/2023), Disp: , Rfl:  Medication Side Effects: weight gain  Family Medical/ Social History: Changes? See HPI  MENTAL HEALTH EXAM:  There were no vitals taken for this visit.There is no height or weight on file to calculate BMI.    General Appearance: Casual and Well Groomed  Eye Contact:  Good  Speech:  Clear and Coherent and Normal Rate  Volume:  Normal  Mood:  Euthymic  Affect:  Congruent  Thought Process:  Goal Directed and Descriptions of Associations: Circumstantial  Orientation:  Full (Time, Place, and Person)  Thought Content: Logical   Suicidal Thoughts:  No  Homicidal Thoughts:  No  Memory:  WNL  Judgement:  Good  Insight:  Good  Psychomotor Activity:  Normal  Concentration:  Concentration: Good and Attention Span: Good  Recall:  Fair  Fund of Knowledge: Good  Language: Good  Assets:  Desire for Improvement Financial Resources/Insurance Housing Physical Health Resilience Transportation  ADL's:  Intact  Cognition: WNL  Prognosis:  Good   PCP follows labs.  DIAGNOSES:    ICD-10-CM   1. Bipolar I disorder (HCC)  F31.9     2. 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 Attention deficit hyperactivity disorder (ADHD), predominantly inattentive type  F90.0     4. Generalized anxiety disorder  F41.1     5. 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 Marital stress  Z63.0       Receiving Psychotherapy: Yes   Lindsay [name]  RECOMMENDATIONS:  PDMP was reviewed.  Last Xanax  filled 01/11/2024.  Adderall filled 12/24/2023. I provided approximately 20 minutes of face to face time during this encounter,  including time spent before and after the visit in records review, medical decision making, counseling pertinent to today's visit, and charting.   As far as her medications go, she is doing well.  She has been on the combination of a benzodiazepine and a stimulant for a long time and I feel that it is beneficial for her to take both.  She knows not to take them within a few hours of each other.  Continue Xanax  1 mg, 1/2-1 po q hs prn sleep. Take as sparingly as possible.  Continue Adderall 10 mg, 1 po bid.  Continue Lamictal  200 mg, 1 p.o. daily. Continue Paxil  40 mg, 1 p.o. daily.   Continue Seroquel  400 mg, 2 po qhs.  Continue multivitamin, B12, and vitamin D. Continue therapy. Return in 3 months.  Verneita Cooks, PA-C

## 2024-01-28 DIAGNOSIS — D649 Anemia, unspecified: Secondary | ICD-10-CM | POA: Diagnosis not present

## 2024-01-28 DIAGNOSIS — R946 Abnormal results of thyroid function studies: Secondary | ICD-10-CM | POA: Diagnosis not present

## 2024-02-01 DIAGNOSIS — D099 Carcinoma in situ, unspecified: Secondary | ICD-10-CM | POA: Diagnosis not present

## 2024-02-12 ENCOUNTER — Other Ambulatory Visit: Payer: Self-pay

## 2024-02-12 ENCOUNTER — Telehealth: Payer: Self-pay | Admitting: Physician Assistant

## 2024-02-12 DIAGNOSIS — N390 Urinary tract infection, site not specified: Secondary | ICD-10-CM | POA: Diagnosis not present

## 2024-02-12 DIAGNOSIS — F411 Generalized anxiety disorder: Secondary | ICD-10-CM

## 2024-02-12 MED ORDER — ALPRAZOLAM 1 MG PO TABS: ORAL_TABLET | ORAL | 1 refills | Status: DC

## 2024-02-12 NOTE — Telephone Encounter (Signed)
 Pt lvm requesting new Rx Xanax  no RF [facility] [employer]. Apt 10/21

## 2024-02-12 NOTE — Telephone Encounter (Signed)
 Patient called back regarding previous message. She would like Xanax  sent instead to [facility] [address] [place] Ph: [phone]

## 2024-02-12 NOTE — Telephone Encounter (Signed)
 Pended

## 2024-02-18 ENCOUNTER — Telehealth: Payer: Self-pay | Admitting: Physician Assistant

## 2024-02-18 NOTE — Telephone Encounter (Signed)
 Next visit is 04/19/24. Requesting refill on Adderall called to:  [facility] #[id number] [place],  - [address] AT [facility] OF [address] & [facility] [address]   Phone: [phone]  Fax: [phone]    Looks like her Adderall is too early to fill. She would like to try to have it filled to see.

## 2024-02-18 NOTE — Telephone Encounter (Signed)
 LVM per DPR that new Rx would be sent tomorrow for fill date 8/23.

## 2024-02-18 NOTE — Telephone Encounter (Signed)
 LF 7/25, due 8/23. Has  RF but it is for 8/24. Will repend for 8/23.

## 2024-02-19 ENCOUNTER — Other Ambulatory Visit: Payer: Self-pay

## 2024-02-19 DIAGNOSIS — F900 Attention-deficit hyperactivity disorder, predominantly inattentive type: Secondary | ICD-10-CM

## 2024-02-19 MED ORDER — AMPHETAMINE-DEXTROAMPHETAMINE 10 MG PO TABS: 10.0000 mg | ORAL_TABLET | Freq: Two times a day (BID) | ORAL | 0 refills | Status: DC

## 2024-02-19 NOTE — Telephone Encounter (Signed)
 Pended

## 2024-03-11 ENCOUNTER — Other Ambulatory Visit: Payer: Self-pay | Admitting: Physician Assistant

## 2024-03-14 DIAGNOSIS — Z5181 Encounter for therapeutic drug level monitoring: Secondary | ICD-10-CM | POA: Diagnosis not present

## 2024-03-14 DIAGNOSIS — E039 Hypothyroidism, unspecified: Secondary | ICD-10-CM | POA: Diagnosis not present

## 2024-03-17 DIAGNOSIS — L814 Other melanin hyperpigmentation: Secondary | ICD-10-CM | POA: Diagnosis not present

## 2024-03-17 DIAGNOSIS — L821 Other seborrheic keratosis: Secondary | ICD-10-CM | POA: Diagnosis not present

## 2024-04-18 DIAGNOSIS — N1832 Chronic kidney disease, stage 3b: Secondary | ICD-10-CM | POA: Diagnosis not present

## 2024-04-19 ENCOUNTER — Encounter: Payer: Self-pay | Admitting: Physician Assistant

## 2024-04-19 ENCOUNTER — Ambulatory Visit: Admitting: Physician Assistant

## 2024-04-19 DIAGNOSIS — F319 Bipolar disorder, unspecified: Secondary | ICD-10-CM | POA: Diagnosis not present

## 2024-04-19 DIAGNOSIS — F411 Generalized anxiety disorder: Secondary | ICD-10-CM

## 2024-04-19 DIAGNOSIS — F429 Obsessive-compulsive disorder, unspecified: Secondary | ICD-10-CM

## 2024-04-19 DIAGNOSIS — G4700 Insomnia, unspecified: Secondary | ICD-10-CM | POA: Diagnosis not present

## 2024-04-19 MED ORDER — ALPRAZOLAM 1 MG PO TABS: ORAL_TABLET | ORAL | 2 refills | Status: DC

## 2024-04-19 NOTE — Progress Notes (Signed)
 [facility]  Patient ID: Lindsay [name],  MRN: [id number]  PCP: Rolinda Millman, MD  Date of Evaluation: 04/19/2024 Time spent:20 minutes  Chief Complaint:  Chief Complaint   Follow-up    HISTORY/CURRENT STATUS: HPI  3 month med check.   She stopped Paxil  about 3 months, weaned herself off. Is going through a divorce and wants to 'feel her feelings' Not just be numb.  She also went off Adderall for the same reason.  [place] it made her feel 'dull.'  Her husband has moved into an apt.  She's still in their home.  States she's lonely but glad to be at this stage of the separation.  Divorce will be final next fall.  The OCD (germs) has gotten a little worse since going off the Paxil  but not enough for her to worry about. No PA.  Does get overwhelmed sometimes.  Xanax  helps when needed.   Patient is able to enjoy things.  Exercising, drinking water , going back to church.  Energy and motivation are good.  No extreme sadness, tearfulness, or feelings of hopelessness.  Sleeps well most of the time.  About 7 hours usually.  ADLs and personal hygiene are normal.   Denies any changes in concentration, making decisions, or remembering things.  Appetite has not changed.  Weight is stable.  No SI/HI.  Patient denies increased energy with decreased need for sleep, increased talkativeness, racing thoughts, impulsivity or risky behaviors, increased spending, increased libido, grandiosity, increased irritability or anger, paranoia, or hallucinations.  Individual Medical History/ Review of Systems: Changes? :No     Past medications for mental health diagnoses include: Geodon , Lamictal , Latuda, Paxil , trazodone , Ambien , Seroquel , Vraylar, Risperdal, Saphris, Depakote, Fanapt, Abilify, Rexulti, Wellbutrin, Provigil, Nuvigil, Vyvanse , Adderall (stopped when she was hospitalized for psychosis 02/2021) Strattera  reportedly made her blood pressure drop.  Metformin  for weight gain caused GI side  effects.  Hospitalization 02/2021-for psychosis  ECT  Allergies: Strattera  [atomoxetine ]  Current Medications:  Current Outpatient Medications:    Cholecalciferol 125 MCG (5000 UT) capsule, Take 5,000 Units by mouth daily., Disp: , Rfl:    estradiol  (ESTRACE ) 0.5 MG tablet, Take 0.5 tablets by mouth daily., Disp: , Rfl:    lamoTRIgine  (LAMICTAL ) 200 MG tablet, TAKE 1 TABLET(200 MG) BY MOUTH DAILY, Disp: 90 tablet, Rfl: 1   levothyroxine  (SYNTHROID ) 137 MCG tablet, Take 137 mcg by mouth daily before breakfast., Disp: , Rfl:    progesterone  (PROMETRIUM ) 200 MG capsule, Take 200 mg by mouth daily., Disp: , Rfl:    QUEtiapine  (SEROQUEL ) 400 MG tablet, Take 2 tablets (800 mg total) by mouth at bedtime., Disp: 180 tablet, Rfl: 3   rosuvastatin (CRESTOR) 10 MG tablet, Take 10 mg by mouth daily., Disp: , Rfl:    ALPRAZolam  (XANAX ) 1 MG tablet, 1/2 po in the late afternoon prn, and 1 po at bedtime prn., Disp: 45 tablet, Rfl: 2   amphetamine -dextroamphetamine  (ADDERALL) 10 MG tablet, Take 1 tablet (10 mg total) by mouth 2 (two) times daily with breakfast and lunch. (Patient not taking: Reported on 04/19/2024), Disp: 60 tablet, Rfl: 0   amphetamine -dextroamphetamine  (ADDERALL) 10 MG tablet, Take 1 tablet (10 mg total) by mouth 2 (two) times daily with breakfast and lunch. (Patient not taking: Reported on 04/19/2024), Disp: 60 tablet, Rfl: 0   amphetamine -dextroamphetamine  (ADDERALL) 10 MG tablet, Take 1 tablet (10 mg total) by mouth 2 (two) times daily with breakfast and lunch. (Patient not taking: Reported on 04/19/2024), Disp: 60 tablet, Rfl: 0  liothyronine  (CYTOMEL ) 5 MCG tablet, Take 5 mcg by mouth daily. (Patient not taking: Reported on 04/10/2023), Disp: , Rfl:    PARoxetine  (PAXIL ) 40 MG tablet, Take 1 tablet (40 mg total) by mouth every morning. (Patient not taking: Reported on 04/19/2024), Disp: 90 tablet, Rfl: 3   simvastatin  (ZOCOR ) 20 MG tablet, Take 20 mg by mouth at bedtime. (Patient not  taking: Reported on 12/30/2022), Disp: , Rfl:    vitamin B-12 (CYANOCOBALAMIN ) 1000 MCG tablet, Take 1,000 mcg by mouth daily. (Patient not taking: Reported on 04/10/2023), Disp: , Rfl:  Medication Side Effects: weight gain  Family Medical/ Social History: Changes? See HPI  MENTAL HEALTH EXAM:  There were no vitals taken for this visit.There is no height or weight on file to calculate BMI.    General Appearance: Casual and Well Groomed  Eye Contact:  Good  Speech:  Clear and Coherent, Normal Rate, and talkative which can be normal for her  Volume:  Normal  Mood:  hyper  Affect:  Congruent  Thought Process:  Goal Directed and Descriptions of Associations: Circumstantial  Orientation:  Full (Time, Place, and Person)  Thought Content: Logical   Suicidal Thoughts:  No  Homicidal Thoughts:  No  Memory:  WNL  Judgement:  Good  Insight:  Good  Psychomotor Activity:  Normal  Concentration:  Concentration: Good and Attention Span: Good  Recall:  Fair  Fund of Knowledge: Good  Language: Good  Assets:  Communication Skills Desire for Improvement Financial Resources/Insurance Housing Physical Health Resilience Transportation  ADL's:  Intact  Cognition: WNL  Prognosis:  Good   PCP follows labs.  DIAGNOSES:    ICD-10-CM   1. 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 Generalized anxiety disorder  F41.1 ALPRAZolam  (XANAX ) 1 MG tablet    3. Bipolar I disorder (HCC)  F31.9     4. Insomnia, unspecified type  G47.00      Receiving Psychotherapy: Yes   [name]  RECOMMENDATIONS:  PDMP was reviewed.  Last Xanax  filled 03/25/2024.  Adderall filled 02/20/2024. I provided approximately 20 minutes of face to face time during this encounter, including time spent before and after the visit in records review, medical decision making, counseling pertinent to today's visit, and charting.   Discussed not making med changes without discussing with me.    She's doing well at this time so no changes are needed. DO NOT d/c the Seroquel .   Continue Xanax  1 mg, 1/2-1 po q hs prn sleep. Take as sparingly as possible.  Continue Lamictal  200 mg, 1 p.o. daily. Continue Seroquel  400 mg, 2 po qhs.  Continue multivitamin, B12, and vitamin D. Continue therapy. Return in 3 months.  Verneita Cooks, PA-C

## 2024-04-26 ENCOUNTER — Telehealth: Payer: Self-pay | Admitting: Physician Assistant

## 2024-04-26 NOTE — Telephone Encounter (Signed)
 Pt reports only taking one 1 mg tablet for sleep, doesn't take during the day. Reviewed other recommendations with her.

## 2024-04-26 NOTE — Telephone Encounter (Signed)
 Is she taking the Xanax  every night?  And what dose?  Since she is not on the Adderall any longer okay for her to take the Xanax  more often if she needs it.  Also recommend melatonin if she has not tried that, up to 10 mg, but think it works better at a lower dose, 3 mg for example.  Be really strict about going to bed at the same time every night.  No nightlights or TV on, no computer beginning 2 hours before the time she needs to go to sleep.  Also make sure she is getting enough exercise every day.

## 2024-04-26 NOTE — Telephone Encounter (Signed)
 Pt seen 10/21.  Continue Xanax  1 mg, 1/2-1 po q hs prn sleep. Take as sparingly as possible.  Continue Lamictal  200 mg, 1 p.o. daily. Continue Seroquel  400 mg, 2 po qhs.  Continue multivitamin, B12, and vitamin D. Continue therapy.  She said she was getting some sleep, but less now. Going thru a divorce. She said she was taking all the medications as prescribed, including Seroquel . Reviewed sleep hygiene and no areas to work on. Not going to bed until late and sleeping about 4 hours. She said she can sometimes get another couple of hours.   Pharmacy [facility] - [address]

## 2024-04-26 NOTE — Telephone Encounter (Signed)
 Lindsay [name] lvm that she is not sleeping well. She goes to bed at 11:15 am and wakes up at 3:15 am and can't go back to sleep. Please call her at [phone]

## 2024-04-27 NOTE — Telephone Encounter (Signed)
 She can take Xanax  2 mg at bedtime.  Ok to send in higher qty if needed.

## 2024-04-28 DIAGNOSIS — L570 Actinic keratosis: Secondary | ICD-10-CM | POA: Diagnosis not present

## 2024-04-28 NOTE — Telephone Encounter (Signed)
 Pt informed of recommendation. She reported she got 5 mg melatonin and with the increased Xanax  is sleeping thru the night.

## 2024-04-29 DIAGNOSIS — E039 Hypothyroidism, unspecified: Secondary | ICD-10-CM | POA: Diagnosis not present

## 2024-05-05 DIAGNOSIS — E049 Nontoxic goiter, unspecified: Secondary | ICD-10-CM | POA: Diagnosis not present

## 2024-05-05 DIAGNOSIS — F3181 Bipolar II disorder: Secondary | ICD-10-CM | POA: Diagnosis not present

## 2024-05-05 DIAGNOSIS — E039 Hypothyroidism, unspecified: Secondary | ICD-10-CM | POA: Diagnosis not present

## 2024-05-05 DIAGNOSIS — I10 Essential (primary) hypertension: Secondary | ICD-10-CM | POA: Diagnosis not present

## 2024-05-17 DIAGNOSIS — F3181 Bipolar II disorder: Secondary | ICD-10-CM | POA: Diagnosis not present

## 2024-05-18 DIAGNOSIS — I10 Essential (primary) hypertension: Secondary | ICD-10-CM | POA: Diagnosis not present

## 2024-05-18 DIAGNOSIS — R002 Palpitations: Secondary | ICD-10-CM | POA: Diagnosis not present

## 2024-05-19 ENCOUNTER — Other Ambulatory Visit: Payer: Self-pay

## 2024-05-19 ENCOUNTER — Telehealth: Payer: Self-pay | Admitting: Physician Assistant

## 2024-05-19 DIAGNOSIS — F411 Generalized anxiety disorder: Secondary | ICD-10-CM

## 2024-05-19 NOTE — Telephone Encounter (Signed)
 Pt states at last appt Lindsay [name] increased her Xanax  to 2 at bedtime and she needs a rf.    [facility] [address]  [place]

## 2024-05-19 NOTE — Telephone Encounter (Signed)
 Pended

## 2024-05-20 MED ORDER — ALPRAZOLAM 1 MG PO TABS: 2.0000 mg | ORAL_TABLET | Freq: Every day | ORAL | 2 refills | Status: DC

## 2024-06-13 ENCOUNTER — Telehealth: Payer: Self-pay | Admitting: Physician Assistant

## 2024-06-13 NOTE — Telephone Encounter (Signed)
 Lindsay [name] called and said that she is struggling. She said that she is crying and her anxiety is very high. She needs something to help her. Please call her at [phone]

## 2024-06-14 NOTE — Telephone Encounter (Signed)
 Pt reports she stopped the Paxil  40 mg approximately 2-1/2 months ago. She said she is high anxiety and is in a deep depression and restarted Paxil  yesterday. She has Xanax  for sleep and reports it is working well for sleep, but is asking to have some during the day. She reports she is going thru a divorce and usually has SAD as well. She is in counseling and a support group. Rates anxiety as 9/10, depression as 10/10. Said she finally got a shower today.  [facility] [employer].

## 2024-06-14 NOTE — Telephone Encounter (Signed)
 Pt called back and Lindsay [name] reviewed recommendations. Pt then asked to speak to me. She was not receptive to anything I said after telling her about Xanax . She said she needs the two Xanax  to stay asleep and if she doesn't have those she can't sleep, and won't have anything for during the day. She does not have a mood therapy lamp, said she exercises and eats healthy. I asked her a couple of times to let me finish with your recommendations and she just kept saying she needed extra Xanax .

## 2024-06-14 NOTE — Telephone Encounter (Signed)
 She has enough Xanax  for 2 pills per day, so she can take 1 of those pills during the day and the other at bedtime. Continue the Paxil  but know that it may take 4-6 weeks before it's as effective as it was.  Use the mood therapy lamp daily.  Make sure she's exercising daily, eat as healthy as possible.  If SI, go to [facility].

## 2024-06-15 NOTE — Telephone Encounter (Signed)
 Let's increase the Lamictal  200 mg to 1 1/2 pills (300 mg) daily. This will help prevent the anxiety.  Increasing the Xanax  at this time is not the best option. We need to prevent the anxiety, not just put a bandaid on it.

## 2024-06-15 NOTE — Telephone Encounter (Signed)
 Recommendation to increase Lamictal  to 300 mg reviewed with patient. Told her to just split what she has and when she needs a new Rx to let us  know.

## 2024-07-15 ENCOUNTER — Other Ambulatory Visit: Payer: Self-pay | Admitting: Physician Assistant

## 2024-07-18 ENCOUNTER — Other Ambulatory Visit: Payer: Self-pay | Admitting: Physician Assistant

## 2024-07-18 NOTE — Telephone Encounter (Signed)
 Last visit says she is not taking. Called to verify and she said she restarted it. RF sent.

## 2024-07-21 ENCOUNTER — Ambulatory Visit (INDEPENDENT_AMBULATORY_CARE_PROVIDER_SITE_OTHER): Admitting: Physician Assistant

## 2024-07-21 ENCOUNTER — Encounter: Payer: Self-pay | Admitting: Physician Assistant

## 2024-07-21 DIAGNOSIS — G4700 Insomnia, unspecified: Secondary | ICD-10-CM

## 2024-07-21 DIAGNOSIS — G47419 Narcolepsy without cataplexy: Secondary | ICD-10-CM | POA: Diagnosis not present

## 2024-07-21 DIAGNOSIS — F319 Bipolar disorder, unspecified: Secondary | ICD-10-CM | POA: Diagnosis not present

## 2024-07-21 DIAGNOSIS — F411 Generalized anxiety disorder: Secondary | ICD-10-CM

## 2024-07-21 DIAGNOSIS — F900 Attention-deficit hyperactivity disorder, predominantly inattentive type: Secondary | ICD-10-CM

## 2024-07-21 DIAGNOSIS — F429 Obsessive-compulsive disorder, unspecified: Secondary | ICD-10-CM

## 2024-07-21 MED ORDER — AMPHETAMINE-DEXTROAMPHETAMINE 10 MG PO TABS: 10.0000 mg | ORAL_TABLET | Freq: Two times a day (BID) | ORAL | 0 refills | Status: AC

## 2024-07-21 MED ORDER — ALPRAZOLAM 1 MG PO TABS: 2.0000 mg | ORAL_TABLET | Freq: Every day | ORAL | 2 refills | Status: AC

## 2024-07-21 MED ORDER — LAMOTRIGINE 200 MG PO TABS: 300.0000 mg | ORAL_TABLET | Freq: Every day | ORAL | 1 refills | Status: DC

## 2024-07-21 NOTE — Progress Notes (Signed)
 "     [facility]  Patient ID: Lindsay [name],  MRN: [id number]  PCP: Lindsay Millman, MD  Date of Evaluation: 07/21/2024 Time spent:20 minutes  Chief Complaint:  Chief Complaint   Anxiety; Depression; ADHD; Follow-up    HISTORY/CURRENT STATUS: HPI  For f/u  She went off the Paxil  (she reported in Oct) but got more anxious and OCD was really bad.  Went back on it in the middle of Dec and already feels better.  Feels like all of her medications are working well.  She does not feel depressed even though she and her husband are separated and will get a divorce as soon as possible.  She does experience anxiety occasionally but not very often.  She does take the Xanax  usually at bedtime for sleep and it is effective.  She does not take it at the same time she takes Adderall.  OCD symptoms are under control.  Energy and motivation are good.   No extreme sadness, tearfulness, or feelings of hopelessness.  Sleeps well most of the time.  ADLs and personal hygiene are normal.   Denies any changes in concentration, making decisions, or remembering things.  Appetite has not changed.  Weight is stable.  No SI/HI.  No reports of increased energy with decreased need for sleep, increased talkativeness, racing thoughts, impulsivity or risky behaviors, increased spending, increased libido, grandiosity, increased irritability or anger, paranoia, or hallucinations.  Individual Medical History/ Review of Systems: Changes? :No     Past medications for mental health diagnoses include: Geodon , Lamictal , Latuda, Paxil , trazodone , Ambien , Seroquel , Vraylar, Risperdal, Saphris, Depakote, Fanapt, Abilify, Rexulti, Wellbutrin, Provigil, Nuvigil, Vyvanse , Adderall (stopped when she was hospitalized for psychosis 02/2021) Strattera  reportedly made her blood pressure drop.  Metformin  for weight gain caused GI side effects.  Hospitalization 02/2021-for psychosis  ECT  Allergies: Strattera  [atomoxetine ]  Current  Medications:  Current Outpatient Medications:    Cholecalciferol 125 MCG (5000 UT) capsule, Take 5,000 Units by mouth daily., Disp: , Rfl:    estradiol  (ESTRACE ) 0.5 MG tablet, Take 0.5 tablets by mouth daily., Disp: , Rfl:    levothyroxine  (SYNTHROID ) 137 MCG tablet, Take 137 mcg by mouth daily before breakfast., Disp: , Rfl:    PARoxetine  (PAXIL ) 40 MG tablet, TAKE 1 TABLET(40 MG) BY MOUTH EVERY MORNING, Disp: 90 tablet, Rfl: 0   progesterone  (PROMETRIUM ) 200 MG capsule, Take 200 mg by mouth daily., Disp: , Rfl:    QUEtiapine  (SEROQUEL ) 400 MG tablet, TAKE 2 TABLETS(800 MG) BY MOUTH AT BEDTIME, Disp: 180 tablet, Rfl: 0   rosuvastatin (CRESTOR) 10 MG tablet, Take 10 mg by mouth daily., Disp: , Rfl:    ALPRAZolam  (XANAX ) 1 MG tablet, Take 2 tablets (2 mg total) by mouth at bedtime., Disp: 60 tablet, Rfl: 2   [START ON 08/29/2024] amphetamine -dextroamphetamine  (ADDERALL) 10 MG tablet, Take 1 tablet (10 mg total) by mouth 2 (two) times daily with breakfast and lunch., Disp: 60 tablet, Rfl: 0   [START ON 09/28/2024] amphetamine -dextroamphetamine  (ADDERALL) 10 MG tablet, Take 1 tablet (10 mg total) by mouth 2 (two) times daily with breakfast and lunch., Disp: 60 tablet, Rfl: 0   [START ON 08/02/2024] amphetamine -dextroamphetamine  (ADDERALL) 10 MG tablet, Take 1 tablet (10 mg total) by mouth 2 (two) times daily with breakfast and lunch., Disp: 60 tablet, Rfl: 0   lamoTRIgine  (LAMICTAL ) 200 MG tablet, Take 1.5 tablets (300 mg total) by mouth daily., Disp: , Rfl:    liothyronine  (CYTOMEL ) 5 MCG tablet, Take 5  mcg by mouth daily. (Patient not taking: Reported on 04/10/2023), Disp: , Rfl:    simvastatin  (ZOCOR ) 20 MG tablet, Take 20 mg by mouth at bedtime. (Patient not taking: Reported on 12/30/2022), Disp: , Rfl:    vitamin B-12 (CYANOCOBALAMIN ) 1000 MCG tablet, Take 1,000 mcg by mouth daily. (Patient not taking: Reported on 04/10/2023), Disp: , Rfl:  Medication Side Effects: weight gain  Family Medical/ Social  History: Changes? See HPI  MENTAL HEALTH EXAM:  There were no vitals taken for this visit.There is no height or weight on file to calculate BMI.    General Appearance: Casual and Well Groomed  Eye Contact:  Good  Speech:  Clear and Coherent, Normal Rate, and talkative which can be normal for her  Volume:  Normal  Mood:  Euthymic  Affect:  Congruent  Thought Process:  Goal Directed and Descriptions of Associations: Circumstantial  Orientation:  Full (Time, Place, and Person)  Thought Content: Logical   Suicidal Thoughts:  No  Homicidal Thoughts:  No  Memory:  WNL  Judgement:  Good  Insight:  Good  Psychomotor Activity:  Normal  Concentration:  Concentration: Good and Attention Span: Good  Recall:  Fair  Fund of Knowledge: Good  Language: Good  Assets:  Communication Skills Desire for Improvement Financial Resources/Insurance Housing Physical Health Resilience Transportation  ADL's:  Intact  Cognition: WNL  Prognosis:  Good   PCP follows labs.  DIAGNOSES:    ICD-10-CM   1. Bipolar I disorder (HCC)  F31.9     2. Generalized anxiety disorder  F41.1 ALPRAZolam  (XANAX ) 1 MG tablet    3. Attention deficit hyperactivity disorder (ADHD), predominantly inattentive type  F90.0 amphetamine -dextroamphetamine  (ADDERALL) 10 MG tablet    amphetamine -dextroamphetamine  (ADDERALL) 10 MG tablet    4. 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 Insomnia, unspecified type  G47.00     6. 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  Last Xanax  filled 06/21/2024.  Adderall filled 07/04/2024. I provided approximately  20 minutes of face to face time during this encounter, including time spent before and after the visit in records review, medical decision making, counseling pertinent to today's visit, and charting.   Lindsay [name] is doing well on her current treatment  regimen.  No changes need to be made.  Continue Xanax  1 mg, 1/2-1 po q hs prn sleep. Take as sparingly as possible.  Continue Adderall 10 mg, 1 p.o. at breakfast and at lunch. Continue Lamictal  200 mg, 1.5 pills daily. Continue Seroquel  400 mg, 2 po qhs.  Continue multivitamin, B12, and vitamin D. Continue therapy. Return in 6 months.  Verneita Cooks, PA-C       "

## 2024-07-29 MED ORDER — LAMOTRIGINE 200 MG PO TABS: 300.0000 mg | ORAL_TABLET | Freq: Every day | ORAL | Status: AC

## 2024-08-04 ENCOUNTER — Telehealth: Payer: Self-pay | Admitting: Physician Assistant

## 2024-08-04 DIAGNOSIS — F411 Generalized anxiety disorder: Secondary | ICD-10-CM

## 2024-08-04 NOTE — Telephone Encounter (Signed)
 Start hydroxyzine  25 mg, #30, 1 tid prn anxiety. RF 1

## 2024-08-04 NOTE — Telephone Encounter (Signed)
 Next visit is 01/19/25. Lindsay [name] states that she is having anxiety. She is taking 2 Xanax  in the AM with the Seroquel  in the PM. Please call her at [phone]. Pharmacy is:  [id number] [address], [place] - [address] AT [facility] [address] [facility] [address]   Phone: [phone]  Fax: [phone]

## 2024-08-04 NOTE — Telephone Encounter (Addendum)
 Pt last seen 1/22.  Continue Xanax  1 mg, 1/2-1 po q hs prn sleep. Take as sparingly as possible.  Continue Adderall 10 mg, 1 p.o. at breakfast and at lunch. Continue Lamictal  200 mg, 1.5 pills daily. Continue Seroquel  400 mg, 2 po qhs.  Continue multivitamin, B12, and vitamin D. Continue therapy.  She us  taking 2 Xanax  at night and Seroquel  for sleep. Reporting anxiety as 9/10 and doesn't have anything to take for it during the day. Rx for Xanax  was sent in as 1 mg, 2 at bedtime.   She reports that even though she is going thru a divorce after 46 years of marriage that it has been fairly amicable. She does state that she thinks restarting the Adderall may be contributing to the anxiety. She takes with breakfast and lunch, but fee;s she gets anxious when it starts wearing off.   Pharmacy - [facility] on [address].

## 2024-08-05 MED ORDER — HYDROXYZINE HCL 25 MG PO TABS: 25.0000 mg | ORAL_TABLET | Freq: Three times a day (TID) | ORAL | 1 refills | Status: AC | PRN

## 2024-08-05 NOTE — Telephone Encounter (Signed)
 Rx sent. Patient notified.

## 2025-01-19 ENCOUNTER — Ambulatory Visit: Admitting: Physician Assistant
